# Patient Record
Sex: Female | Born: 1965 | Race: White | Hispanic: No | Marital: Married | Smoking: Never smoker
Health system: Southern US, Community
[De-identification: ages and names within clinical notes are randomized; demographics above are authoritative.]

## PROBLEM LIST (undated history)

## (undated) DIAGNOSIS — F1020 Alcohol dependence, uncomplicated: Secondary | ICD-10-CM

## (undated) DIAGNOSIS — E785 Hyperlipidemia, unspecified: Secondary | ICD-10-CM

## (undated) DIAGNOSIS — F329 Major depressive disorder, single episode, unspecified: Secondary | ICD-10-CM

## (undated) DIAGNOSIS — I10 Essential (primary) hypertension: Secondary | ICD-10-CM

## (undated) DIAGNOSIS — M069 Rheumatoid arthritis, unspecified: Secondary | ICD-10-CM

## (undated) DIAGNOSIS — F32A Depression, unspecified: Secondary | ICD-10-CM

## (undated) DIAGNOSIS — H5789 Other specified disorders of eye and adnexa: Secondary | ICD-10-CM

## (undated) DIAGNOSIS — M79604 Pain in right leg: Secondary | ICD-10-CM

## (undated) DIAGNOSIS — B009 Herpesviral infection, unspecified: Secondary | ICD-10-CM

## (undated) DIAGNOSIS — G709 Myoneural disorder, unspecified: Secondary | ICD-10-CM

## (undated) DIAGNOSIS — F99 Mental disorder, not otherwise specified: Secondary | ICD-10-CM

## (undated) DIAGNOSIS — E669 Obesity, unspecified: Secondary | ICD-10-CM

## (undated) DIAGNOSIS — K219 Gastro-esophageal reflux disease without esophagitis: Secondary | ICD-10-CM

## (undated) DIAGNOSIS — T7840XA Allergy, unspecified, initial encounter: Secondary | ICD-10-CM

## (undated) DIAGNOSIS — F419 Anxiety disorder, unspecified: Secondary | ICD-10-CM

## (undated) HISTORY — DX: Alcohol dependence, uncomplicated: F10.20

## (undated) HISTORY — DX: Herpesviral infection, unspecified: B00.9

## (undated) HISTORY — DX: Rheumatoid arthritis, unspecified: M06.9

## (undated) HISTORY — DX: Hyperlipidemia, unspecified: E78.5

## (undated) HISTORY — PX: PAROTIDECTOMY: SUR1003

## (undated) HISTORY — DX: Depression, unspecified: F32.A

## (undated) HISTORY — DX: Allergy, unspecified, initial encounter: T78.40XA

## (undated) HISTORY — PX: CHOLECYSTECTOMY: SHX55

## (undated) HISTORY — DX: Essential (primary) hypertension: I10

## (undated) HISTORY — PX: BACK SURGERY: SHX140

## (undated) HISTORY — DX: Obesity, unspecified: E66.9

## (undated) HISTORY — DX: Major depressive disorder, single episode, unspecified: F32.9

## (undated) HISTORY — PX: EYE SURGERY: SHX253

---

## 1998-02-13 ENCOUNTER — Other Ambulatory Visit: Admission: RE | Admit: 1998-02-13 | Discharge: 1998-02-13 | Payer: Self-pay | Admitting: *Deleted

## 1998-11-17 ENCOUNTER — Encounter (INDEPENDENT_AMBULATORY_CARE_PROVIDER_SITE_OTHER): Payer: Self-pay

## 1998-11-17 ENCOUNTER — Encounter: Payer: Self-pay | Admitting: Orthopedic Surgery

## 1998-11-17 ENCOUNTER — Observation Stay (HOSPITAL_COMMUNITY): Admission: RE | Admit: 1998-11-17 | Discharge: 1998-11-18 | Payer: Self-pay | Admitting: Orthopedic Surgery

## 1999-11-16 ENCOUNTER — Other Ambulatory Visit: Admission: RE | Admit: 1999-11-16 | Discharge: 1999-11-16 | Payer: Self-pay | Admitting: *Deleted

## 2000-12-21 ENCOUNTER — Other Ambulatory Visit: Admission: RE | Admit: 2000-12-21 | Discharge: 2000-12-21 | Payer: Self-pay | Admitting: Internal Medicine

## 2000-12-26 ENCOUNTER — Encounter: Admission: RE | Admit: 2000-12-26 | Discharge: 2000-12-26 | Payer: Self-pay | Admitting: Neurosurgery

## 2000-12-26 ENCOUNTER — Encounter: Payer: Self-pay | Admitting: Neurosurgery

## 2001-01-09 ENCOUNTER — Encounter: Payer: Self-pay | Admitting: Neurosurgery

## 2001-01-09 ENCOUNTER — Encounter: Admission: RE | Admit: 2001-01-09 | Discharge: 2001-01-09 | Payer: Self-pay | Admitting: Neurosurgery

## 2002-01-01 ENCOUNTER — Other Ambulatory Visit: Admission: RE | Admit: 2002-01-01 | Discharge: 2002-01-01 | Payer: Self-pay | Admitting: Internal Medicine

## 2004-01-05 ENCOUNTER — Other Ambulatory Visit: Admission: RE | Admit: 2004-01-05 | Discharge: 2004-01-05 | Payer: Self-pay | Admitting: Internal Medicine

## 2005-02-15 ENCOUNTER — Other Ambulatory Visit: Admission: RE | Admit: 2005-02-15 | Discharge: 2005-02-15 | Payer: Self-pay | Admitting: Internal Medicine

## 2006-08-25 ENCOUNTER — Encounter: Admission: RE | Admit: 2006-08-25 | Discharge: 2006-08-25 | Payer: Self-pay | Admitting: Internal Medicine

## 2007-03-02 ENCOUNTER — Emergency Department (HOSPITAL_COMMUNITY): Admission: EM | Admit: 2007-03-02 | Discharge: 2007-03-02 | Payer: Self-pay | Admitting: Emergency Medicine

## 2007-05-09 ENCOUNTER — Emergency Department (HOSPITAL_COMMUNITY): Admission: EM | Admit: 2007-05-09 | Discharge: 2007-05-09 | Payer: Self-pay | Admitting: Family Medicine

## 2007-06-11 ENCOUNTER — Ambulatory Visit (HOSPITAL_BASED_OUTPATIENT_CLINIC_OR_DEPARTMENT_OTHER): Admission: RE | Admit: 2007-06-11 | Discharge: 2007-06-11 | Payer: Self-pay | Admitting: Otolaryngology

## 2007-06-11 ENCOUNTER — Encounter (INDEPENDENT_AMBULATORY_CARE_PROVIDER_SITE_OTHER): Payer: Self-pay | Admitting: Otolaryngology

## 2007-08-07 ENCOUNTER — Emergency Department (HOSPITAL_COMMUNITY): Admission: EM | Admit: 2007-08-07 | Discharge: 2007-08-07 | Payer: Self-pay | Admitting: Family Medicine

## 2008-08-29 ENCOUNTER — Ambulatory Visit (HOSPITAL_COMMUNITY): Admission: RE | Admit: 2008-08-29 | Discharge: 2008-08-29 | Payer: Self-pay | Admitting: Internal Medicine

## 2008-10-23 ENCOUNTER — Encounter (INDEPENDENT_AMBULATORY_CARE_PROVIDER_SITE_OTHER): Payer: Self-pay | Admitting: General Surgery

## 2008-10-23 ENCOUNTER — Ambulatory Visit (HOSPITAL_COMMUNITY): Admission: RE | Admit: 2008-10-23 | Discharge: 2008-10-23 | Payer: Self-pay | Admitting: General Surgery

## 2009-03-17 ENCOUNTER — Other Ambulatory Visit: Admission: RE | Admit: 2009-03-17 | Discharge: 2009-03-17 | Payer: Self-pay | Admitting: Internal Medicine

## 2010-09-07 LAB — DIFFERENTIAL
Eosinophils Absolute: 0.1 10*3/uL (ref 0.0–0.7)
Eosinophils Relative: 2 % (ref 0–5)
Lymphs Abs: 1.4 10*3/uL (ref 0.7–4.0)
Monocytes Relative: 15 % — ABNORMAL HIGH (ref 3–12)

## 2010-09-07 LAB — COMPREHENSIVE METABOLIC PANEL
ALT: 18 U/L (ref 0–35)
AST: 20 U/L (ref 0–37)
CO2: 28 mEq/L (ref 19–32)
Calcium: 9.3 mg/dL (ref 8.4–10.5)
GFR calc Af Amer: >60 mL/min (ref >60)
GFR calc non Af Amer: >60 mL/min (ref >60)
Sodium: 138 mEq/L (ref 135–145)
Total Protein: 6.9 g/dL (ref 6.0–8.3)

## 2010-09-07 LAB — PREGNANCY, URINE: Preg Test, Ur: NEGATIVE

## 2010-09-07 LAB — CBC
MCHC: 34.2 g/dL (ref 30.0–36.0)
RBC: 4.03 MIL/uL (ref 3.87–5.11)

## 2010-10-12 NOTE — Op Note (Signed)
NAMEKATHYLEEN, RADICE                 ACCOUNT NO.:  [id number]   MEDICAL RECORD NO.:  [id number]          PATIENT TYPE:  AMB   LOCATION:  DAY                          FACILITY:  [facility]   PHYSICIAN:  Anselm Pancoast. Weatherly, M.D.DATE OF BIRTH:  February 16, 1966   DATE OF PROCEDURE:  10/23/2008  DATE OF DISCHARGE:                               OPERATIVE REPORT   PREOPERATIVE DIAGNOSES:  Chronic cholecystitis with stones.   POSTOPERATIVE DIAGNOSES:  Chronic cholecystitis with stones.   OPERATION:  Laparoscopic cholecystectomy with cholangiogram.   ANESTHESIA:  General.   HISTORY:  Lorynn Michele Cooley is a 45 year old Caucasian female, I think she is  actually an employee of [employer], who was referred to me with  symptomatic gallstones.  She has had a history of back problems and is  on pain medication of methotrexate.  Dr.  Stacey Drain is her  physician, and then she started having episodes of epigastric pain and  saw Dr. Marisue Brooklyn, who ordered an ultrasound and this showed  multiple gallstones.  I think previously they thought that her arthritis  medicine was probably given her stomach irritation.  She has had no  previous abdominal surgery.  She has a past history of rheumatoid  arthritis, but has not got the small joint deformities.  I recommended  that we proceed on with a cholecystectomy and operative cholangiogram  and she is here for the planned procedure.  She hopes to be returning  back to work next week.   DESCRIPTION OF PROCEDURE:  The patient was taken to the operative suite.  She received 3 grams of Unasyn.  She has got PAS stockings.  Permit had  been signed, etc.  After induction of general anesthesia and the abdomen  prepped, she was draped in a sterile manner and then timeout completed  with the planned procedure and etc.  A small incision was made below the  umbilicus.  She wears an umbilical ring that had been previously removed  and we made the small fascial defect.  The  fascia was picked up with two  Kochers and then the underlying peritoneum opened with a blunt tip of a  Kelly and then a pursestring suture of 0 Vicryl placed and Hassan  cannula introduced.  The gallbladder was significantly dilated but not  acutely inflamed and there is really no adhesions around the  gallbladder.  She has got numerous stones you can see in the  gallbladder.  The upper 10 mm trocar was placed after anesthetizing the  fascia under direct vision to the right side of the falciform ligament.  The two lateral 5-mm trocars were placed under direct vision after  anesthetizing the fascia.  The gallbladder was retracted upward and out  with a stone impacted in the neck of the gallbladder, and I could  retract the gallbladder laterally and then you could see what looked  like two structures kind of going to the gallbladder in the area that  was lying on top in the usual position for the artery was actually the  cystic duct.  This was encompassed with  a right angle and I put a clip  at the junction of the gallbladder.  First, I sort of placed a proximal  clip, but because of the size and I could not see any actual pulsation,  we elected to go ahead and open it and identified it as the cystic duct.  We put in a Cook catheter held in place with a clip and a cholangiogram  performed.  It shows kind of a prominent extrahepatic biliary system  with good flow into the duodenum and it tapers down nicely and I do not  think we have evidence of any common duct stones.  Preoperatively, liver  function tests were normal.  The catheter was removed and I put three  clips on the cystic duct proximally and divided it.  Next, we kind of  cautiously dissected out the structure that was a little bit larger than  I would expect for just the cystic artery and we followed it on up to  where it obviously joins the junction with the gallbladder and there was  no component going posterior as if it would  be the right hepatic branch  with the cystic duct coming off of it.  I then put two clips on it  proximally and one distally and divided it.  After I could see the  posterior side even better, I did put a third clip slightly proximal to  the others because you could see the pulsation and then really a bit  prominent artery for a cystic duct artery.  The gallbladder was then  freed from its bed.  There were no other significant vascular  structures.  A few little areas were coagulated as we were trimming the  gallbladder from its bed.  I placed the gallbladder in an EndoCatch bag  and then looking up at the gallbladder fossa on the most right side of  the falciform ligament where we retracted on the gallbladder was a  little very small peritoneal rent and this was coagulated.  It was not  actively bleeding.  The irrigation fluid was aspirated.  We re-  inspected, re-irrigated, checked the anatomy.  The clips were all in  good position.  We then switched the camera to the upper 10 mm port,  grabbed the bag and pulled it up the fascia.  I opened the fascia just  about a quarter of an inch trying to get the stones of the gallbladder  out, but the stones were so big that it was necessary to actually open  the gallbladder within the bag and then using the sponge stick crushed  the bigger stones which were really very large, but then there they were  many other smaller stones.  The bag containing the stones then could be  come on through and then I put an additional figure-of-eight of 0 Vicryl  in the fascia and anesthetized the fascia with about 10 mL of Marcaine  with adrenaline.  Good hemostasis.  Then we removed the 5-mm ports under  direct vision, checked to make sure all the remaining irrigating fluid  had been aspirated and then placed also a fascial stitch in the fascia  in the subxiphoid area since you could see right into the peritoneal  cavity.  The subcutaneous wounds were closed with  4-0 Vicryl.  Benzoin  and Steri-Strips on the skin.  The patient wants to be released later  this morning and she should be fine and her friend will pick her up.  Anselm Pancoast. Zachery Dakins, M.D.  Electronically Signed     WJW/MEDQ  D:  10/23/2008  T:  10/23/2008  Job:  [id number]   cc:   Aundra Dubin, M.D.  [address]  [place]  [place] [address]   Lovenia Kim, D.O.  Fax: [phone]

## 2010-10-12 NOTE — Op Note (Signed)
Michele Cooley, Michele Cooley                 ACCOUNT NO.:  [id number]   MEDICAL RECORD NO.:  [id number]          PATIENT TYPE:  AMB   LOCATION:  DSC                          FACILITY:  [facility]   PHYSICIAN:  Jefry H. Pollyann Kennedy, MD     DATE OF BIRTH:  [birth year]   DATE OF PROCEDURE:  06/11/2007  DATE OF DISCHARGE:                               OPERATIVE REPORT   PREOPERATIVE DIAGNOSIS:  Left parotid mass.   POSTOPERATIVE DIAGNOSIS:  Left parotid mass.   OPERATION PERFORMED:  Left superficial parotidectomy with facial nerve  dissection.   SURGEON:  Jefry H. Pollyann Kennedy, MD   ASSISTANT:  Antony Contras, MD   ANESTHESIA:  General endotracheal.   COMPLICATIONS:  None.   ESTIMATED BLOOD LOSS:  Minimal.   FINDINGS:  A 2.5 to 3 cm firm mass in the anterior aspect of the  parotid.  No other findings.  Specimen was sent for pathological  evaluation.   INDICATIONS FOR PROCEDURE:  The patient is a 45 year old lady with a  couple year history of an enlarging mass in the left parotid area.  Risks, benefits and alternatives and complications of the procedure were  explained to the patient who seemed to understand and agreed to surgery.   DESCRIPTION OF PROCEDURE:  The patient was taken to the operating room  and placed on the operating table in supine position.  Following  induction of general endotracheal anesthesia, the patient was prepped  and draped in standard fashion.  A parotidectomy incision was outlined  in the preauricular sulcus with extension down below the lobule and  around the mastoid tip.  The incision was outlined with a marking pen  and then injected with Xylocaine with epinephrine.  A 15 scalpel was  used to incise the skin and then subcutaneous tissue was dissected using  electrocautery.  The greater auricular nerve was identified and the  lateral branch was traced and preserved.  The parotid was resected off  of the upper sternocleidomastoid muscle and off the ear canal bringing  it  anteriorly.  A skin flap was elevated anteriorly beyond the tumor.  Dissection down along the ear canal exposed the posterior belly of the  digastric muscle an the tympanomastoid suture line.  The main trunk of  the facial nerve was identified and then dissection started distally  from there using a McCabe dissector, bipolar forceps and scissors.  Small branches of the nerve were identified and were dissected out  peripherally.  Once the lateral lobe of the glans was brought forward,  the mass was identified and was also dissected out from between the  upper and lower division.  There was a deep component to the mass.  The  specimen was sent for pathologic evaluation once delivered.  The wound  was irrigated with saline.  4-0 silk ties were used for larger vessels  and bipolar cautery for smaller ones.  After adequate  hemostasis was achieved, a 7 [place] round drain was left in the wound  and the wound was closed in layers using 3-0 chromic on the deep layer  and Dermabond on the skin.  The patient was then  awakened, extubated  and transferred to recovery in stable condition.      Jefry H. Pollyann Kennedy, MD  Electronically Signed     JHR/MEDQ  D:  06/11/2007  T:  06/11/2007  Job:  [id number]   cc:   Lovenia Kim, D.O.

## 2011-10-03 ENCOUNTER — Other Ambulatory Visit: Payer: Self-pay | Admitting: Neurosurgery

## 2011-10-03 DIAGNOSIS — M549 Dorsalgia, unspecified: Secondary | ICD-10-CM

## 2011-10-06 ENCOUNTER — Other Ambulatory Visit: Payer: Self-pay

## 2011-10-07 ENCOUNTER — Other Ambulatory Visit (HOSPITAL_COMMUNITY): Payer: Self-pay | Admitting: Neurosurgery

## 2011-10-07 DIAGNOSIS — M549 Dorsalgia, unspecified: Secondary | ICD-10-CM

## 2011-10-10 ENCOUNTER — Ambulatory Visit (HOSPITAL_COMMUNITY)
Admission: RE | Admit: 2011-10-10 | Discharge: 2011-10-10 | Disposition: A | Discharge status: Home / Self Care | Payer: 59 | Source: Ambulatory Visit | Attending: Neurosurgery | Admitting: Neurosurgery

## 2011-10-10 DIAGNOSIS — R209 Unspecified disturbances of skin sensation: Secondary | ICD-10-CM | POA: Insufficient documentation

## 2011-10-10 DIAGNOSIS — M79609 Pain in unspecified limb: Secondary | ICD-10-CM | POA: Insufficient documentation

## 2011-10-10 DIAGNOSIS — M549 Dorsalgia, unspecified: Secondary | ICD-10-CM

## 2011-10-14 ENCOUNTER — Other Ambulatory Visit: Payer: Self-pay | Admitting: Neurosurgery

## 2011-10-14 ENCOUNTER — Encounter (HOSPITAL_COMMUNITY): Payer: Self-pay | Admitting: Pharmacy Technician

## 2011-10-17 ENCOUNTER — Encounter (HOSPITAL_COMMUNITY): Payer: Self-pay

## 2011-10-17 ENCOUNTER — Encounter (HOSPITAL_COMMUNITY)
Admission: RE | Admit: 2011-10-17 | Discharge: 2011-10-17 | Disposition: A | Discharge status: Home / Self Care | Payer: 59 | Source: Ambulatory Visit | Attending: Neurosurgery | Admitting: Neurosurgery

## 2011-10-17 HISTORY — DX: Pain in right leg: M79.604

## 2011-10-17 HISTORY — DX: Myoneural disorder, unspecified: G70.9

## 2011-10-17 HISTORY — DX: Anxiety disorder, unspecified: F41.9

## 2011-10-17 LAB — CBC
HCT: 37.6 % (ref 36.0–46.0)
Hemoglobin: 13.1 g/dL (ref 12.0–15.0)
MCH: 32.9 pg (ref 26.0–34.0)
MCHC: 34.8 g/dL (ref 30.0–36.0)

## 2011-10-17 NOTE — Pre-Procedure Instructions (Signed)
20 ANZLEE HINESLEY  10/17/2011   Your procedure is scheduled on:  Oct 17 728 Report to [facility] at 0530 AM.  Call this number if you have problems the morning of surgery: [phone]   Remember:   Do not eat food:After Midnight.  May have clear liquids: up to 4 Hours before arrival.  Clear liquids include soda, tea, black coffee, apple or grape juice, broth.  Take these medicines the morning of surgery with A SIP OF WATER: actclovir,wellbutrin,valium,lexapro,plaquenil,arava,oxycodone,estradiol   Do not wear jewelry, make-up or nail polish.  Do not wear lotions, powders, or perfumes. You may wear deodorant.  Do not shave 48 hours prior to surgery. Men may shave face and neck.  Do not bring valuables to the hospital.  Contacts, dentures or bridgework may not be worn into surgery.  Leave suitcase in the car. After surgery it may be brought to your room.  For patients admitted to the hospital, checkout time is 11:00 AM the day of discharge.   Patients discharged the day of surgery will not be allowed to drive home.  Name and phone number of your driver: family  Special Instructions: CHG Shower Use Special Wash: 1/2 bottle night before surgery and 1/2 bottle morning of surgery.   Please read over the following fact sheets that you were given: Pain Booklet, Coughing and Deep Breathing, MRSA Information and Surgical Site Infection Prevention

## 2011-10-18 ENCOUNTER — Encounter (HOSPITAL_COMMUNITY): Payer: Self-pay | Admitting: *Deleted

## 2011-10-18 ENCOUNTER — Ambulatory Visit (HOSPITAL_COMMUNITY): Payer: 59

## 2011-10-18 ENCOUNTER — Inpatient Hospital Stay (HOSPITAL_COMMUNITY)
Admission: RE | Admit: 2011-10-18 | Discharge: 2011-10-19 | DRG: 491 | Disposition: A | Discharge status: Home / Self Care | Payer: 59 | Source: Ambulatory Visit | Attending: Neurosurgery | Admitting: Neurosurgery

## 2011-10-18 ENCOUNTER — Ambulatory Visit (HOSPITAL_COMMUNITY): Payer: 59 | Admitting: *Deleted

## 2011-10-18 ENCOUNTER — Encounter (HOSPITAL_COMMUNITY)
Admission: RE | Disposition: A | Discharge status: Home / Self Care | Payer: Self-pay | Source: Ambulatory Visit | Attending: Neurosurgery

## 2011-10-18 DIAGNOSIS — Z79899 Other long term (current) drug therapy: Secondary | ICD-10-CM

## 2011-10-18 DIAGNOSIS — M069 Rheumatoid arthritis, unspecified: Secondary | ICD-10-CM | POA: Diagnosis present

## 2011-10-18 DIAGNOSIS — M51379 Other intervertebral disc degeneration, lumbosacral region without mention of lumbar back pain or lower extremity pain: Secondary | ICD-10-CM | POA: Diagnosis present

## 2011-10-18 DIAGNOSIS — Z01812 Encounter for preprocedural laboratory examination: Secondary | ICD-10-CM

## 2011-10-18 DIAGNOSIS — F411 Generalized anxiety disorder: Secondary | ICD-10-CM | POA: Diagnosis present

## 2011-10-18 DIAGNOSIS — M5126 Other intervertebral disc displacement, lumbar region: Principal | ICD-10-CM | POA: Diagnosis present

## 2011-10-18 DIAGNOSIS — M5137 Other intervertebral disc degeneration, lumbosacral region: Secondary | ICD-10-CM | POA: Diagnosis present

## 2011-10-18 HISTORY — PX: LUMBAR LAMINECTOMY/DECOMPRESSION MICRODISCECTOMY: SHX5026

## 2011-10-18 SURGERY — LUMBAR LAMINECTOMY/DECOMPRESSION MICRODISCECTOMY
Anesthesia: General | Site: Back | Laterality: Right | Wound class: Clean

## 2011-10-18 MED ORDER — HYDROMORPHONE HCL PF 1 MG/ML IJ SOLN
0.2500 mg | INTRAMUSCULAR | Status: DC | PRN
  Administered 2011-10-18: 0.5 mg via INTRAVENOUS

## 2011-10-18 MED ORDER — NON FORMULARY
Status: DC | PRN
  Administered 2011-10-18: 3 mL

## 2011-10-18 MED ORDER — SODIUM CHLORIDE 0.9 % IV SOLN: INTRAVENOUS | Status: DC

## 2011-10-18 MED ORDER — GLYCOPYRROLATE 0.2 MG/ML IJ SOLN
INTRAMUSCULAR | Status: DC | PRN
  Administered 2011-10-18: .5 mg via INTRAVENOUS

## 2011-10-18 MED ORDER — SUFENTANIL CITRATE 50 MCG/ML IV SOLN
INTRAVENOUS | Status: DC | PRN
  Administered 2011-10-18: 10 ug via INTRAVENOUS
  Administered 2011-10-18 (×2): 20 ug via INTRAVENOUS

## 2011-10-18 MED ORDER — MENTHOL 3 MG MT LOZG
1.0000 | LOZENGE | OROMUCOSAL | Status: DC | PRN
  Filled 2011-10-18: qty 9

## 2011-10-18 MED ORDER — ONDANSETRON HCL 4 MG/2ML IJ SOLN: 4.0000 mg | INTRAMUSCULAR | Status: DC | PRN

## 2011-10-18 MED ORDER — HYDROXYCHLOROQUINE SULFATE 200 MG PO TABS
400.0000 mg | ORAL_TABLET | Freq: Every day | ORAL | Status: DC
  Administered 2011-10-18 – 2011-10-19 (×2): 400 mg via ORAL
  Filled 2011-10-18 (×2): qty 2

## 2011-10-18 MED ORDER — ONDANSETRON HCL 4 MG/2ML IJ SOLN: 4.0000 mg | Freq: Once | INTRAMUSCULAR | Status: DC | PRN

## 2011-10-18 MED ORDER — SODIUM CHLORIDE 0.9 % IJ SOLN: 3.0000 mL | INTRAMUSCULAR | Status: DC | PRN

## 2011-10-18 MED ORDER — SODIUM CHLORIDE 0.9 % IV SOLN: 250.0000 mL | INTRAVENOUS | Status: DC

## 2011-10-18 MED ORDER — BUPROPION HCL ER (XL) 300 MG PO TB24
300.0000 mg | ORAL_TABLET | Freq: Every day | ORAL | Status: DC
  Administered 2011-10-18 – 2011-10-19 (×2): 300 mg via ORAL
  Filled 2011-10-18 (×2): qty 1

## 2011-10-18 MED ORDER — SODIUM CHLORIDE 0.9 % IJ SOLN
3.0000 mL | Freq: Two times a day (BID) | INTRAMUSCULAR | Status: DC
  Administered 2011-10-18: 3 mL via INTRAVENOUS

## 2011-10-18 MED ORDER — ROCURONIUM BROMIDE 100 MG/10ML IV SOLN
INTRAVENOUS | Status: DC | PRN
  Administered 2011-10-18: 50 mg via INTRAVENOUS

## 2011-10-18 MED ORDER — MORPHINE SULFATE 10 MG/ML IJ SOLN
INTRAMUSCULAR | Status: DC | PRN
  Administered 2011-10-18 (×2): 4 mg via INTRAVENOUS
  Administered 2011-10-18: 2 mg via INTRAVENOUS

## 2011-10-18 MED ORDER — CEFAZOLIN SODIUM 1-5 GM-% IV SOLN
INTRAVENOUS | Status: AC
  Administered 2011-10-18: 1 g via INTRAVENOUS
  Filled 2011-10-18: qty 50

## 2011-10-18 MED ORDER — HEMOSTATIC AGENTS (NO CHARGE) OPTIME
TOPICAL | Status: DC | PRN
  Administered 2011-10-18: 1 via TOPICAL

## 2011-10-18 MED ORDER — OXYCODONE-ACETAMINOPHEN 5-325 MG PO TABS
1.0000 | ORAL_TABLET | ORAL | Status: DC | PRN
  Administered 2011-10-18 – 2011-10-19 (×4): 2 via ORAL
  Filled 2011-10-18 (×4): qty 2

## 2011-10-18 MED ORDER — DROPERIDOL 2.5 MG/ML IJ SOLN
INTRAMUSCULAR | Status: DC | PRN
  Administered 2011-10-18: 0.625 mg via INTRAVENOUS

## 2011-10-18 MED ORDER — MUPIROCIN 2 % EX OINT
TOPICAL_OINTMENT | CUTANEOUS | Status: AC
  Filled 2011-10-18: qty 22

## 2011-10-18 MED ORDER — ACETAMINOPHEN 650 MG RE SUPP: 650.0000 mg | RECTAL | Status: DC | PRN

## 2011-10-18 MED ORDER — OXYCODONE-ACETAMINOPHEN 5-325 MG PO TABS: 1.0000 | ORAL_TABLET | ORAL | Status: DC

## 2011-10-18 MED ORDER — ZOLPIDEM TARTRATE 5 MG PO TABS: 10.0000 mg | ORAL_TABLET | Freq: Every evening | ORAL | Status: DC | PRN

## 2011-10-18 MED ORDER — HYDROMORPHONE HCL PF 1 MG/ML IJ SOLN
INTRAMUSCULAR | Status: AC
  Filled 2011-10-18: qty 1

## 2011-10-18 MED ORDER — BUPIVACAINE-EPINEPHRINE PF 0.5-1:200000 % IJ SOLN
INTRAMUSCULAR | Status: DC | PRN
  Administered 2011-10-18: 10 mL

## 2011-10-18 MED ORDER — MORPHINE SULFATE 4 MG/ML IJ SOLN
4.0000 mg | INTRAMUSCULAR | Status: DC | PRN
  Administered 2011-10-18: 2 mg via INTRAVENOUS
  Filled 2011-10-18: qty 1

## 2011-10-18 MED ORDER — DIAZEPAM 5 MG PO TABS
5.0000 mg | ORAL_TABLET | Freq: Three times a day (TID) | ORAL | Status: DC
  Administered 2011-10-18 – 2011-10-19 (×2): 5 mg via ORAL
  Filled 2011-10-18 (×2): qty 1

## 2011-10-18 MED ORDER — MORPHINE SULFATE 4 MG/ML IJ SOLN: 0.0500 mg/kg | INTRAMUSCULAR | Status: DC | PRN

## 2011-10-18 MED ORDER — ACYCLOVIR 800 MG PO TABS
800.0000 mg | ORAL_TABLET | ORAL | Status: DC
  Administered 2011-10-18 – 2011-10-19 (×4): 800 mg via ORAL
  Filled 2011-10-18 (×8): qty 1

## 2011-10-18 MED ORDER — MORPHINE SULFATE 2 MG/ML IJ SOLN
INTRAMUSCULAR | Status: AC
  Administered 2011-10-18: 2 mg via INTRAVENOUS
  Filled 2011-10-18: qty 1

## 2011-10-18 MED ORDER — PROPOFOL 10 MG/ML IV BOLUS
INTRAVENOUS | Status: DC | PRN
  Administered 2011-10-18: 200 mg via INTRAVENOUS

## 2011-10-18 MED ORDER — ACETAMINOPHEN 325 MG PO TABS: 650.0000 mg | ORAL_TABLET | ORAL | Status: DC | PRN

## 2011-10-18 MED ORDER — NEOSTIGMINE METHYLSULFATE 1 MG/ML IJ SOLN
INTRAMUSCULAR | Status: DC | PRN
  Administered 2011-10-18: 4 mg via INTRAVENOUS

## 2011-10-18 MED ORDER — ESCITALOPRAM OXALATE 10 MG PO TABS
10.0000 mg | ORAL_TABLET | Freq: Every day | ORAL | Status: DC
  Administered 2011-10-18 – 2011-10-19 (×2): 10 mg via ORAL
  Filled 2011-10-18 (×2): qty 1

## 2011-10-18 MED ORDER — CEFAZOLIN SODIUM 1-5 GM-% IV SOLN
1.0000 g | Freq: Three times a day (TID) | INTRAVENOUS | Status: AC
  Administered 2011-10-18 (×2): 1 g via INTRAVENOUS
  Filled 2011-10-18 (×2): qty 50

## 2011-10-18 MED ORDER — ONDANSETRON HCL 4 MG/2ML IJ SOLN
INTRAMUSCULAR | Status: DC | PRN
  Administered 2011-10-18: 4 mg via INTRAVENOUS

## 2011-10-18 MED ORDER — LACTATED RINGERS IV SOLN
INTRAVENOUS | Status: DC | PRN
  Administered 2011-10-18 (×2): via INTRAVENOUS

## 2011-10-18 MED ORDER — THROMBIN 5000 UNITS EX SOLR
CUTANEOUS | Status: DC | PRN
  Administered 2011-10-18: 10000 [IU] via TOPICAL

## 2011-10-18 MED ORDER — PHENOL 1.4 % MT LIQD
1.0000 | OROMUCOSAL | Status: DC | PRN
  Filled 2011-10-18: qty 177

## 2011-10-18 MED ORDER — MIDAZOLAM HCL 5 MG/5ML IJ SOLN
INTRAMUSCULAR | Status: DC | PRN
  Administered 2011-10-18: 2 mg via INTRAVENOUS

## 2011-10-18 MED ORDER — 0.9 % SODIUM CHLORIDE (POUR BTL) OPTIME
TOPICAL | Status: DC | PRN
  Administered 2011-10-18: 1000 mL

## 2011-10-18 SURGICAL SUPPLY — 62 items
ADH SKN CLS APL DERMABOND .7 (GAUZE/BANDAGES/DRESSINGS) ×1
APL SKNCLS STERI-STRIP NONHPOA (GAUZE/BANDAGES/DRESSINGS) ×1
BENZOIN TINCTURE PRP APPL 2/3 (GAUZE/BANDAGES/DRESSINGS) ×2 IMPLANT
BLADE SURG ROTATE 9660 (MISCELLANEOUS) IMPLANT
BUR ACORN 6.0 (BURR) ×1 IMPLANT
BUR MATCHSTICK NEURO 3.0 LAGG (BURR) ×2 IMPLANT
CANISTER SUCTION 2500CC (MISCELLANEOUS) ×2 IMPLANT
CLOTH BEACON ORANGE TIMEOUT ST (SAFETY) ×2 IMPLANT
CONT SPEC 4OZ CLIKSEAL STRL BL (MISCELLANEOUS) ×2 IMPLANT
DERMABOND ADVANCED (GAUZE/BANDAGES/DRESSINGS) ×1
DERMABOND ADVANCED .7 DNX12 (GAUZE/BANDAGES/DRESSINGS) IMPLANT
DRAPE LAPAROTOMY 100X72X124 (DRAPES) ×2 IMPLANT
DRAPE MICROSCOPE LEICA (MISCELLANEOUS) ×2 IMPLANT
DRAPE POUCH INSTRU U-SHP 10X18 (DRAPES) ×2 IMPLANT
DRSG PAD ABDOMINAL 8X10 ST (GAUZE/BANDAGES/DRESSINGS) IMPLANT
DURAPREP 26ML APPLICATOR (WOUND CARE) ×2 IMPLANT
DURASEAL SPINE SEALANT 3ML (MISCELLANEOUS) ×1 IMPLANT
ELECT REM PT RETURN 9FT ADLT (ELECTROSURGICAL) ×2
ELECTRODE REM PT RTRN 9FT ADLT (ELECTROSURGICAL) ×1 IMPLANT
GAUZE SPONGE 4X4 16PLY XRAY LF (GAUZE/BANDAGES/DRESSINGS) IMPLANT
GLOVE BIOGEL M 8.0 STRL (GLOVE) ×2 IMPLANT
GLOVE BIOGEL PI IND STRL 8.5 (GLOVE) IMPLANT
GLOVE BIOGEL PI INDICATOR 8.5 (GLOVE) ×2
GLOVE EXAM NITRILE LRG STRL (GLOVE) IMPLANT
GLOVE EXAM NITRILE MD LF STRL (GLOVE) IMPLANT
GLOVE EXAM NITRILE XL STR (GLOVE) IMPLANT
GLOVE EXAM NITRILE XS STR PU (GLOVE) IMPLANT
GLOVE SURG SS PI 8.0 STRL IVOR (GLOVE) ×3 IMPLANT
GOWN BRE IMP SLV AUR LG STRL (GOWN DISPOSABLE) ×2 IMPLANT
GOWN BRE IMP SLV AUR XL STRL (GOWN DISPOSABLE) IMPLANT
GOWN STRL REIN 2XL LVL4 (GOWN DISPOSABLE) ×2 IMPLANT
KIT BASIN OR (CUSTOM PROCEDURE TRAY) ×2 IMPLANT
KIT ROOM TURNOVER OR (KITS) ×2 IMPLANT
NDL HYPO 18GX1.5 BLUNT FILL (NEEDLE) IMPLANT
NDL HYPO 21X1.5 SAFETY (NEEDLE) IMPLANT
NDL HYPO 25X1 1.5 SAFETY (NEEDLE) IMPLANT
NDL SPNL 20GX3.5 QUINCKE YW (NEEDLE) IMPLANT
NEEDLE HYPO 18GX1.5 BLUNT FILL (NEEDLE) IMPLANT
NEEDLE HYPO 21X1.5 SAFETY (NEEDLE) ×2 IMPLANT
NEEDLE HYPO 25X1 1.5 SAFETY (NEEDLE) IMPLANT
NEEDLE SPNL 20GX3.5 QUINCKE YW (NEEDLE) ×2 IMPLANT
NS IRRIG 1000ML POUR BTL (IV SOLUTION) ×2 IMPLANT
PACK LAMINECTOMY NEURO (CUSTOM PROCEDURE TRAY) ×2 IMPLANT
PAD ARMBOARD 7.5X6 YLW CONV (MISCELLANEOUS) ×6 IMPLANT
PATTIES SURGICAL .5 X1 (DISPOSABLE) ×2 IMPLANT
RUBBERBAND STERILE (MISCELLANEOUS) ×4 IMPLANT
SPONGE GAUZE 4X4 12PLY (GAUZE/BANDAGES/DRESSINGS) ×2 IMPLANT
SPONGE LAP 4X18 X RAY DECT (DISPOSABLE) IMPLANT
SPONGE SURGIFOAM ABS GEL SZ50 (HEMOSTASIS) ×2 IMPLANT
STRIP CLOSURE SKIN 1/2X4 (GAUZE/BANDAGES/DRESSINGS) ×2 IMPLANT
SUT PROLENE 6 0 BV (SUTURE) ×1 IMPLANT
SUT VIC AB 0 CT1 18XCR BRD8 (SUTURE) ×1 IMPLANT
SUT VIC AB 0 CT1 8-18 (SUTURE) ×2
SUT VIC AB 2-0 CP2 18 (SUTURE) ×2 IMPLANT
SUT VIC AB 3-0 SH 8-18 (SUTURE) ×2 IMPLANT
SYR 20CC LL (SYRINGE) ×1 IMPLANT
SYR 20ML ECCENTRIC (SYRINGE) ×2 IMPLANT
SYR 5ML LL (SYRINGE) IMPLANT
TAPE CLOTH SURG 4X10 WHT LF (GAUZE/BANDAGES/DRESSINGS) ×1 IMPLANT
TOWEL OR 17X24 6PK STRL BLUE (TOWEL DISPOSABLE) ×1 IMPLANT
TOWEL OR 17X26 10 PK STRL BLUE (TOWEL DISPOSABLE) ×3 IMPLANT
WATER STERILE IRR 1000ML POUR (IV SOLUTION) ×2 IMPLANT

## 2011-10-18 NOTE — H&P (Signed)
Michele Cooley is an 46 y.o. female.   Chief Complaint: right leg pain HPI: patient had 45 discectomy right, about 10 y/a.  Lately the pain is getting worse at to the point she is limping associated with weakness. Had fail with conservative treatment.  Past Medical History  Diagnosis Date  . Neuromuscular disorder   . Leg pain, right   . Anxiety   . Arthritis     rheumatoid arthritis    Past Surgical History  Procedure Date  . Back surgery   . Cholecystectomy     History reviewed. No pertinent family history. Social History:  reports that she has never smoked. She does not have any smokeless tobacco history on file. She reports that she does not drink alcohol or use illicit drugs.  Allergies:  Allergies  Allergen Reactions  . Codeine Itching    Medications Prior to Admission  Medication Sig Dispense Refill  . acyclovir (ZOVIRAX) 400 MG tablet Take 800 mg by mouth every 4 (four) hours while awake.      Marland Kitchen buPROPion (WELLBUTRIN XL) 300 MG 24 hr tablet Take 300 mg by mouth daily.      . diazepam (VALIUM) 5 MG tablet Take 5 mg by mouth every 8 (eight) hours.      Marland Kitchen escitalopram (LEXAPRO) 10 MG tablet Take 10 mg by mouth daily.      . hydroxychloroquine (PLAQUENIL) 200 MG tablet Take 400 mg by mouth daily.      Marland Kitchen leflunomide (ARAVA) 10 MG tablet Take 10 mg by mouth daily.      . Multiple Vitamin (MULITIVITAMIN WITH MINERALS) TABS Take 1 tablet by mouth daily.      . norethindrone-ethinyl estradiol (JUNEL FE,GILDESS FE,LOESTRIN FE) 1-20 MG-MCG tablet Take 1 tablet by mouth daily.      Marland Kitchen oxyCODONE-acetaminophen (PERCOCET) 5-325 MG per tablet Take 1 tablet by mouth every 4 (four) hours. pain        Results for orders placed during the hospital encounter of 10/17/11 (from the past 48 hour(s))  CBC     Status: Normal   Collection Time   10/17/11  4:53 PM      Component Value Range Comment   WBC 6.3  4.0 - 10.5 (K/uL)    RBC 3.98  3.87 - 5.11 (MIL/uL)    Hemoglobin 13.1  12.0 - 15.0  (g/dL)    HCT 19.1  47.8 - 29.5 (%)    MCV 94.5  78.0 - 100.0 (fL)    MCH 32.9  26.0 - 34.0 (pg)    MCHC 34.8  30.0 - 36.0 (g/dL)    RDW 62.1  30.8 - 65.7 (%)    Platelets 247  150 - 400 (K/uL)   HCG, SERUM, QUALITATIVE     Status: Normal   Collection Time   10/17/11  4:53 PM      Component Value Range Comment   Preg, Serum NEGATIVE  NEGATIVE    SURGICAL PCR SCREEN     Status: Abnormal   Collection Time   10/17/11  4:58 PM      Component Value Range Comment   MRSA, PCR NEGATIVE  NEGATIVE     Staphylococcus aureus POSITIVE (*) NEGATIVE     No results found.  Review of Systems  Constitutional: Negative.   Eyes: Negative.   Respiratory: Negative.   Cardiovascular: Negative.   Gastrointestinal: Negative.   Genitourinary: Negative.   Musculoskeletal: Positive for back pain.  Skin: Negative.   Neurological: Positive for  focal weakness.  Endo/Heme/Allergies: Negative.   Psychiatric/Behavioral: Negative.     Blood pressure 126/82, pulse 86, temperature 98.9 F (37.2 C), temperature source Oral, resp. rate 20, last menstrual period 10/05/2011, SpO2 99.00%. Physical Exam came to my office limping from the right leg hent,nl Neck - No masses or thyromegaly or limitation in range of motion,nl cv, nl. Resp, nl abdomen, nl NEURO DF weakness 3/5 in right, sle positive at 10 with sciatic notch positive. Mri, large recurrent hnp at l45  Assessment/Plan Right l45 discectomy. Aware of riska and benefits  Elizer Bostic M 10/18/2011, 7:41 AM

## 2011-10-18 NOTE — Addendum Note (Signed)
Addendum  created 10/18/11 1039 by Kerby Nora, MD   Modules edited:Orders, PRL Based Order Sets

## 2011-10-18 NOTE — Anesthesia Preprocedure Evaluation (Addendum)
Anesthesia Evaluation  Patient identified by MRN, date of birth, ID band Patient awake    Reviewed: Allergy & Precautions, H&P , NPO status , Patient's Chart, lab work & pertinent test results, reviewed documented beta blocker date and time   Airway Mallampati: I TM Distance: >3 FB Neck ROM: Full    Dental  (+) Teeth Intact and Dental Advisory Given   Pulmonary  breath sounds clear to auscultation        Cardiovascular Rhythm:Regular Rate:Normal     Neuro/Psych Anxiety    GI/Hepatic   Endo/Other    Renal/GU      Musculoskeletal   Abdominal   Peds  Hematology   Anesthesia Other Findings   Reproductive/Obstetrics                          Anesthesia Physical Anesthesia Plan  ASA: II  Anesthesia Plan: General   Post-op Pain Management:    Induction: Intravenous  Airway Management Planned: Oral ETT  Additional Equipment:   Intra-op Plan:   Post-operative Plan: Extubation in OR  Informed Consent: I have reviewed the patients History and Physical, chart, labs and discussed the procedure including the risks, benefits and alternatives for the proposed anesthesia with the patient or authorized representative who has indicated his/her understanding and acceptance.   Dental advisory given  Plan Discussed with: CRNA, Anesthesiologist and Surgeon  Anesthesia Plan Comments:         Anesthesia Quick Evaluation

## 2011-10-18 NOTE — Anesthesia Postprocedure Evaluation (Signed)
  Anesthesia Post-op Note  Patient: Michele Cooley  Procedure(s) Performed: Procedure(s) (LRB): LUMBAR LAMINECTOMY/DECOMPRESSION MICRODISCECTOMY (Right)  Patient Location: PACU  Anesthesia Type: General  Level of Consciousness: awake, alert  and oriented  Airway and Oxygen Therapy: Patient Spontanous Breathing and Patient connected to nasal cannula oxygen  Post-op Pain: mild  Post-op Assessment: Post-op Vital signs reviewed  Post-op Vital Signs: Reviewed  Complications: No apparent anesthesia complications

## 2011-10-18 NOTE — Preoperative (Signed)
Beta Blockers   Reason not to administer Beta Blockers:Not Applicable 

## 2011-10-18 NOTE — Progress Notes (Signed)
Initial review is complete. 

## 2011-10-18 NOTE — Progress Notes (Deleted)
Pt given D/C instructions with understanding. Pt D/C'd home via wheelchair @ 1630 per MD order. Pt stable @ D/C. Rema Fendt, RN

## 2011-10-18 NOTE — Transfer of Care (Signed)
Immediate Anesthesia Transfer of Care Note  Patient: Michele Cooley  Procedure(s) Performed: Procedure(s) (LRB): LUMBAR LAMINECTOMY/DECOMPRESSION MICRODISCECTOMY (Right)  Patient Location: PACU  Anesthesia Type: General  Level of Consciousness: awake  Airway & Oxygen Therapy: Patient Spontanous Breathing and Patient connected to face mask oxygen  Post-op Assessment: Report given to PACU RN and Post -op Vital signs reviewed and stable  Post vital signs: Reviewed and stable  Complications: No apparent anesthesia complications

## 2011-10-18 NOTE — Progress Notes (Signed)
Right 45 lumbar discsctomy done  [phone]

## 2011-10-19 ENCOUNTER — Encounter (HOSPITAL_COMMUNITY): Payer: Self-pay | Admitting: Neurosurgery

## 2011-10-19 NOTE — Op Note (Signed)
[name], WITTS NO.:  [id number]  MEDICAL RECORD NO.:  [id number]  LOCATION:  3533                         FACILITY:  [facility]  PHYSICIAN:  Hilda Lias, M.D.   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  PROCEDURE:  Right L4-5 diskectomy, decompression of the L5 nerve root, and lysis of adhesion, microscope.  SURGEON:  Hilda Lias, MD.  ASSISTANT:  Kathaleen Maser. Pool, MD  CLINICAL HISTORY:  The patient is a 46 year old female who 10 years ago underwent surgery at the level 4-5 at right side. Lately, she had been complaining of back pain, worsened to the right leg and weakness  __________.  X-ray shows degenerative disk disease, _and a herniated disc right at_________ 4-5.  The patient wanted to proceed with surgery because she was getting worse__________.  The surgery was fully explained to her including possibility of recurrence, no improvement  __________ CSF leak, infection.  PROCEDURE IN DETAIL:  The patient was taken to the OR; and after intubation, she was positioned in prone manner. The back was cleaned with DuraPrep.  Drapes were applied.  Midline incision following the previous one was made straight down to the lumbar area. Lateral retraction was done_._________  x-ray showed  We were at l45_. The yellow_________ ligament _was removed_________. We found area to be adherent to the canal laterally __________ probably from the surgery 10 years ago.  Retraction was made and at the end, we were able to move the thecal sac laterally. The patient had quite a bit of tissue, mostly compromising via L5 nerve root.  __________ .  Once this was mobilized and the adhesion was removed, we  found a fragment __________ right at the level of the axilla of the nerve. then__________  we entered the disk space and medial and lateral diskectomy was _done_________.  At the end, we had good decompression. Valsalva maneuver was negative. __________. The area was irrigated and closed with several laters of__________ Vicryl and nylon.          ______________________________ Hilda Lias, M.D.     EB/MEDQ  D:  10/18/2011  T:  10/18/2011  Job:  [id number]

## 2011-10-19 NOTE — Discharge Instructions (Signed)
Wound Care °Leave incision open to air. °You may shower. °Do not scrub directly on incision.  °Leave steri-strips on incision.  They will fall off by themselves. °Do not put any creams, lotions, or ointments on incision. °Activity °Walk each and every day, increasing distance each day. °No lifting greater than 5 lbs.  Avoid bending, arching, and twisting. °No driving for 2 weeks; may ride as a passenger locally. ° °Diet °Resume your normal diet.  °Return to Work °Will be discussed at you follow up appointment. °Call Your Doctor If Any of These Occur °Redness, drainage, or swelling at the wound.  °Temperature greater than 101 degrees. °Severe pain not relieved by pain medication. °Incision starts to come apart. °Follow Up Appt °Call today for appointment in 3-4 weeks ([phone]) or for problems.  If you have any hardware placed in your spine, you will need an x-ray before your appointment. ° °

## 2011-10-19 NOTE — Discharge Summary (Signed)
Physician Discharge Summary  Patient ID: Michele Cooley MRN: [id number] DOB/AGE: [birth year] 46 y.o.  Admit date: 10/18/2011 Discharge date: 10/19/2011  Admission Diagnoses: right l45 hnp  Discharge Diagnoses: same   Discharged Condition: stable  Hospital Course: surgery 10/18/11  Consults:none  Significant Diagnostic Studies: mri  Treatments: surgery  Discharge Exam: Blood pressure 121/81, pulse 100, temperature 98.7 F (37.1 C), temperature source Oral, resp. rate 18, last menstrual period 10/05/2011, SpO2 97.00%. Except for incisional pain, her pe is normal-  Disposition: home on percocet and diazepam,   Medication List  As of 10/19/2011 11:13 AM   ASK your doctor about these medications         acyclovir 400 MG tablet   Commonly known as: ZOVIRAX   Take 800 mg by mouth every 4 (four) hours while awake.      buPROPion 300 MG 24 hr tablet   Commonly known as: WELLBUTRIN XL   Take 300 mg by mouth daily.      diazepam 5 MG tablet   Commonly known as: VALIUM   Take 5 mg by mouth every 8 (eight) hours.      escitalopram 10 MG tablet   Commonly known as: LEXAPRO   Take 10 mg by mouth daily.      hydroxychloroquine 200 MG tablet   Commonly known as: PLAQUENIL   Take 400 mg by mouth daily.      leflunomide 10 MG tablet   Commonly known as: ARAVA   Take 10 mg by mouth daily.      mulitivitamin with minerals Tabs   Take 1 tablet by mouth daily.      norethindrone-ethinyl estradiol 1-20 MG-MCG tablet   Commonly known as: JUNEL FE,GILDESS FE,LOESTRIN FE   Take 1 tablet by mouth daily.      oxyCODONE-acetaminophen 5-325 MG per tablet   Commonly known as: PERCOCET   Take 1 tablet by mouth every 4 (four) hours. pain             Signed: Karn Cassis 10/19/2011, 11:13 AM

## 2011-10-19 NOTE — Clinical Social Work Note (Signed)
CSW received a consult for SNF. PT is not recommending PT follow up. CSW is signing off as no further needs identified. Please reconsult if a need arises prior to discharge.   Dede Query, MSW, LCSWA [phone] Covering CSW) [phone]

## 2011-10-19 NOTE — Evaluation (Signed)
Physical Therapy Evaluation Patient Details Name: Michele Cooley MRN: [id number] DOB: 1965-06-22 Today's Date: 10/19/2011 Time: 1478-2956 PT Time Calculation (min): 26 min  PT Assessment / Plan / Recommendation Clinical Impression  Pt is 46 y/o female admitted for s/p Right L4-5 lumbar discsctomy.  Pt moving well and mod (I) with all mobility.  No further PT needs and PT will sign off.      PT Assessment  Patent does not need any further PT services    Follow Up Recommendations  No PT follow up    Barriers to Discharge        lEquipment Recommendations  None recommended by PT    Recommendations for Other Services     Frequency      Precautions / Restrictions Precautions Precautions: Back Precaution Booklet Issued: Yes (comment) Precaution Comments: Educated pt on 3/3 back precautions   Pertinent Vitals/Pain C/o incision pain but does not rate      Mobility  Bed Mobility Bed Mobility: Rolling Right;Right Sidelying to Sit Rolling Right: 6: Modified independent (Device/Increase time) Right Sidelying to Sit: 6: Modified independent (Device/Increase time) Transfers Transfers: Sit to Stand;Stand to Sit Sit to Stand: 6: Modified independent (Device/Increase time);From bed;From chair/3-in-1 Stand to Sit: 6: Modified independent (Device/Increase time);To bed;To chair/3-in-1 Ambulation/Gait Ambulation/Gait Assistance: 6: Modified independent (Device/Increase time) Ambulation Distance (Feet): 200 Feet Assistive device: None Ambulation/Gait Assistance Details: Needs extra time to complete ambulation due to surgical site Gait Pattern: Decreased trunk rotation;Wide base of support Stairs: Yes Stairs Assistance: 6: Modified independent (Device/Increase time) Stair Management Technique: One rail Left;Alternating pattern;Forwards Number of Stairs: 3     Exercises     PT Diagnosis:    PT Problem List:   PT Treatment Interventions:     PT Goals    Visit Information  Last PT  Received On: 10/19/11 Assistance Needed: +1    Subjective Data  Subjective: "I'm feeling really good and ready to go home. Patient Stated Goal: To go home   Prior Functioning  Home Living Lives With: Spouse Available Help at Discharge: Family Type of Home: House Home Access: Stairs to enter [employer] of Steps: 3 Entrance Stairs-Rails: None Home Layout: Two level;Able to live on main level with bedroom/bathroom;Full bath on main level Bathroom Shower/Tub: Walk-in shower;Door [employer] Toilet: Standard Bathroom Accessibility: Yes How Accessible: Accessible via walker Home Adaptive Equipment: Grab bars around toilet;Grab bars in shower;Built-in shower seat;Hand-held shower hose;Reacher Prior Function Level of Independence: Independent Able to Take Stairs?: Yes Driving: Yes Vocation: Full time employment Communication Communication: No difficulties    Cognition  Overall Cognitive Status: Appears within functional limits for tasks assessed/performed Arousal/Alertness: Awake/alert Orientation Level: Appears intact for tasks assessed Behavior During Session: [facility] for tasks performed    Extremity/Trunk Assessment Right Upper Extremity Assessment RUE ROM/Strength/Tone: Within functional levels RUE Sensation: WFL - Light Touch RUE Coordination: WFL - gross/fine motor Left Upper Extremity Assessment LUE ROM/Strength/Tone: Within functional levels LUE Sensation: WFL - Light Touch LUE Coordination: WFL - gross/fine motor Right Lower Extremity Assessment RLE ROM/Strength/Tone: Within functional levels RLE Sensation: WFL - Light Touch RLE Coordination: WFL - gross/fine motor Left Lower Extremity Assessment LLE ROM/Strength/Tone: Within functional levels LLE Sensation: WFL - Light Touch LLE Coordination: WFL - gross/fine motor Trunk Assessment Trunk Assessment: Normal   Balance    End of Session PT - End of Session Equipment Utilized During Treatment: Gait  belt Activity Tolerance: Patient tolerated treatment well Patient left: in chair;with call bell/phone within reach Nurse Communication:  Mobility status   Tarea Skillman 10/19/2011, 8:28 AM Jake Shark, PT DPT [phone]

## 2012-03-23 ENCOUNTER — Other Ambulatory Visit (HOSPITAL_COMMUNITY): Payer: Self-pay | Admitting: Orthopedic Surgery

## 2012-03-23 ENCOUNTER — Ambulatory Visit (HOSPITAL_COMMUNITY)
Admission: RE | Admit: 2012-03-23 | Discharge: 2012-03-23 | Disposition: A | Discharge status: Home / Self Care | Payer: 59 | Source: Ambulatory Visit | Attending: Orthopedic Surgery | Admitting: Orthopedic Surgery

## 2012-03-23 DIAGNOSIS — M545 Low back pain, unspecified: Secondary | ICD-10-CM

## 2012-03-23 DIAGNOSIS — M5126 Other intervertebral disc displacement, lumbar region: Secondary | ICD-10-CM | POA: Insufficient documentation

## 2012-03-23 DIAGNOSIS — Z9889 Other specified postprocedural states: Secondary | ICD-10-CM | POA: Insufficient documentation

## 2012-03-23 DIAGNOSIS — M5380 Other specified dorsopathies, site unspecified: Secondary | ICD-10-CM | POA: Insufficient documentation

## 2012-03-23 MED ORDER — GADOBENATE DIMEGLUMINE 529 MG/ML IV SOLN
16.0000 mL | Freq: Once | INTRAVENOUS | Status: AC | PRN
  Administered 2012-03-23: 16 mL via INTRAVENOUS

## 2012-03-26 ENCOUNTER — Ambulatory Visit (HOSPITAL_COMMUNITY): Admission: RE | Admit: 2012-03-26 | Payer: 59 | Source: Ambulatory Visit

## 2012-04-10 ENCOUNTER — Other Ambulatory Visit: Payer: Self-pay | Admitting: Neurosurgery

## 2012-04-18 ENCOUNTER — Encounter (HOSPITAL_COMMUNITY): Payer: Self-pay | Admitting: Pharmacy Technician

## 2012-04-23 ENCOUNTER — Encounter (HOSPITAL_COMMUNITY): Payer: Self-pay

## 2012-04-23 ENCOUNTER — Encounter (HOSPITAL_COMMUNITY)
Admission: RE | Admit: 2012-04-23 | Discharge: 2012-04-23 | Disposition: A | Discharge status: Home / Self Care | Payer: 59 | Source: Ambulatory Visit | Attending: Neurosurgery | Admitting: Neurosurgery

## 2012-04-23 HISTORY — DX: Other specified disorders of eye and adnexa: H57.89

## 2012-04-23 HISTORY — DX: Mental disorder, not otherwise specified: F99

## 2012-04-23 HISTORY — DX: Gastro-esophageal reflux disease without esophagitis: K21.9

## 2012-04-23 LAB — CBC
HCT: 37.4 % (ref 36.0–46.0)
MCH: 32.9 pg (ref 26.0–34.0)
MCHC: 34.5 g/dL (ref 30.0–36.0)
MCV: 95.4 fL (ref 78.0–100.0)
RDW: 13.9 % (ref 11.5–15.5)

## 2012-04-23 LAB — BASIC METABOLIC PANEL
BUN: 12 mg/dL (ref 6–23)
CO2: 28 mEq/L (ref 19–32)
Chloride: 98 mEq/L (ref 96–112)
Creatinine, Ser: 0.67 mg/dL (ref 0.50–1.10)
GFR calc Af Amer: >90 mL/min (ref >90)
Glucose, Bld: 87 mg/dL (ref 70–99)

## 2012-04-23 LAB — HCG, SERUM, QUALITATIVE: Preg, Serum: NEGATIVE

## 2012-04-23 LAB — SURGICAL PCR SCREEN: Staphylococcus aureus: POSITIVE — AB

## 2012-04-23 LAB — ABO/RH: ABO/RH(D): O POS

## 2012-04-23 NOTE — Pre-Procedure Instructions (Signed)
20 Michele Cooley  04/23/2012   Your procedure is scheduled on:   Wednesday  05/02/12  Report to [facility] at  1115 AM.  Call this number if you have problems the morning of surgery: [phone]   Remember:   Do not eat food OR DRINK :After Midnight    Take these medicines the morning of surgery with A SIP OF WATER:  ACYCLOVIR, XANAX, WELLBUTRIN, LEXAPRO, DILAUDID AS NEEDED, PRILOSEC, ORTHOCYCLEN    Do not wear jewelry, make-up or nail polish.  Do not wear lotions, powders, or perfumes. You may wear deodorant.  Do not shave 48 hours prior to surgery. Men may shave face and neck.  Do not bring valuables to the hospital.  Contacts, dentures or bridgework may not be worn into surgery.  Leave suitcase in the car. After surgery it may be brought to your room.  For patients admitted to the hospital, checkout time is 11:00 AM the day of discharge.   Patients discharged the day of surgery will not be allowed to drive home.  Name and phone number of your driver:   Special Instructions: Shower using CHG 2 nights before surgery and the night before surgery.  If you shower the day of surgery use CHG.  Use special wash - you have one bottle of CHG for all showers.  You should use approximately 1/3 of the bottle for each shower.   Please read over the following fact sheets that you were given: Pain Booklet, Coughing and Deep Breathing, Blood Transfusion Information, MRSA Information and Surgical Site Infection Prevention

## 2012-04-23 NOTE — Progress Notes (Addendum)
Call to [facility] for orders from Dr. Jeral Fruit. They will be signed 04/30/2012, when he is back in the office

## 2012-04-23 NOTE — Progress Notes (Signed)
Pt. Denies ever having any cardiac testing in the past, denies cardiac history.

## 2012-05-02 ENCOUNTER — Ambulatory Visit (HOSPITAL_COMMUNITY): Payer: 59

## 2012-05-02 ENCOUNTER — Encounter (HOSPITAL_COMMUNITY)
Admission: RE | Disposition: A | Discharge status: Home / Self Care | Payer: Self-pay | Source: Ambulatory Visit | Attending: Neurosurgery

## 2012-05-02 ENCOUNTER — Inpatient Hospital Stay (HOSPITAL_COMMUNITY)
Admission: RE | Admit: 2012-05-02 | Discharge: 2012-05-05 | DRG: 460 | Disposition: A | Discharge status: Home / Self Care | Payer: 59 | Source: Ambulatory Visit | Attending: Neurosurgery | Admitting: Neurosurgery

## 2012-05-02 ENCOUNTER — Ambulatory Visit (HOSPITAL_COMMUNITY): Payer: 59 | Admitting: Anesthesiology

## 2012-05-02 ENCOUNTER — Encounter (HOSPITAL_COMMUNITY): Payer: Self-pay | Admitting: Anesthesiology

## 2012-05-02 ENCOUNTER — Encounter (HOSPITAL_COMMUNITY): Payer: Self-pay | Admitting: General Practice

## 2012-05-02 ENCOUNTER — Encounter (HOSPITAL_COMMUNITY): Payer: Self-pay | Admitting: *Deleted

## 2012-05-02 DIAGNOSIS — Z9089 Acquired absence of other organs: Secondary | ICD-10-CM

## 2012-05-02 DIAGNOSIS — K219 Gastro-esophageal reflux disease without esophagitis: Secondary | ICD-10-CM | POA: Diagnosis present

## 2012-05-02 DIAGNOSIS — M51379 Other intervertebral disc degeneration, lumbosacral region without mention of lumbar back pain or lower extremity pain: Secondary | ICD-10-CM | POA: Diagnosis present

## 2012-05-02 DIAGNOSIS — M5137 Other intervertebral disc degeneration, lumbosacral region: Secondary | ICD-10-CM | POA: Diagnosis present

## 2012-05-02 DIAGNOSIS — M5126 Other intervertebral disc displacement, lumbar region: Principal | ICD-10-CM | POA: Diagnosis present

## 2012-05-02 DIAGNOSIS — IMO0002 Reserved for concepts with insufficient information to code with codable children: Secondary | ICD-10-CM | POA: Diagnosis present

## 2012-05-02 DIAGNOSIS — G709 Myoneural disorder, unspecified: Secondary | ICD-10-CM | POA: Diagnosis present

## 2012-05-02 SURGERY — POSTERIOR LUMBAR FUSION 1 LEVEL
Anesthesia: General | Site: Back | Wound class: Clean

## 2012-05-02 MED ORDER — MUPIROCIN CALCIUM 2 % EX CREA
TOPICAL_CREAM | Freq: Two times a day (BID) | CUTANEOUS | Status: DC
  Administered 2012-05-02: 22:00:00 via TOPICAL
  Administered 2012-05-02: 1 via TOPICAL
  Administered 2012-05-03 – 2012-05-05 (×5): via TOPICAL
  Filled 2012-05-02: qty 15

## 2012-05-02 MED ORDER — SODIUM CHLORIDE 0.9 % IJ SOLN: 9.0000 mL | INTRAMUSCULAR | Status: DC | PRN

## 2012-05-02 MED ORDER — PROPOFOL 10 MG/ML IV BOLUS
INTRAVENOUS | Status: DC | PRN
  Administered 2012-05-02: 160 mg via INTRAVENOUS

## 2012-05-02 MED ORDER — SODIUM CHLORIDE 0.9 % IV SOLN: 250.0000 mL | INTRAVENOUS | Status: DC

## 2012-05-02 MED ORDER — NALOXONE HCL 0.4 MG/ML IJ SOLN: 0.4000 mg | INTRAMUSCULAR | Status: DC | PRN

## 2012-05-02 MED ORDER — ONDANSETRON HCL 4 MG/2ML IJ SOLN: 4.0000 mg | INTRAMUSCULAR | Status: DC | PRN

## 2012-05-02 MED ORDER — SODIUM CHLORIDE 0.9 % IJ SOLN: 3.0000 mL | INTRAMUSCULAR | Status: DC | PRN

## 2012-05-02 MED ORDER — LACTATED RINGERS IV SOLN
INTRAVENOUS | Status: DC | PRN
  Administered 2012-05-02 (×3): via INTRAVENOUS

## 2012-05-02 MED ORDER — CEFAZOLIN SODIUM-DEXTROSE 2-3 GM-% IV SOLR
INTRAVENOUS | Status: AC
  Administered 2012-05-02: 2 g via INTRAVENOUS
  Filled 2012-05-02: qty 50

## 2012-05-02 MED ORDER — ACETAMINOPHEN 650 MG RE SUPP: 650.0000 mg | RECTAL | Status: DC | PRN

## 2012-05-02 MED ORDER — FENTANYL CITRATE 0.05 MG/ML IJ SOLN: 25.0000 ug | INTRAMUSCULAR | Status: DC | PRN

## 2012-05-02 MED ORDER — 0.9 % SODIUM CHLORIDE (POUR BTL) OPTIME
TOPICAL | Status: DC | PRN
  Administered 2012-05-02: 1000 mL

## 2012-05-02 MED ORDER — ZOLPIDEM TARTRATE 5 MG PO TABS: 5.0000 mg | ORAL_TABLET | Freq: Every evening | ORAL | Status: DC | PRN

## 2012-05-02 MED ORDER — FENTANYL CITRATE 0.05 MG/ML IJ SOLN
INTRAMUSCULAR | Status: AC
  Filled 2012-05-02: qty 2

## 2012-05-02 MED ORDER — FENTANYL CITRATE 0.05 MG/ML IJ SOLN
INTRAMUSCULAR | Status: DC | PRN
  Administered 2012-05-02 (×4): 50 ug via INTRAVENOUS
  Administered 2012-05-02: 100 ug via INTRAVENOUS
  Administered 2012-05-02: 50 ug via INTRAVENOUS

## 2012-05-02 MED ORDER — MIDAZOLAM HCL 5 MG/5ML IJ SOLN
INTRAMUSCULAR | Status: DC | PRN
  Administered 2012-05-02: 2 mg via INTRAVENOUS

## 2012-05-02 MED ORDER — MUPIROCIN 2 % EX OINT
TOPICAL_OINTMENT | CUTANEOUS | Status: AC
  Administered 2012-05-02: 21:00:00
  Filled 2012-05-02: qty 22

## 2012-05-02 MED ORDER — DIAZEPAM 5 MG PO TABS
5.0000 mg | ORAL_TABLET | Freq: Four times a day (QID) | ORAL | Status: DC | PRN
  Administered 2012-05-04 (×2): 5 mg via ORAL
  Filled 2012-05-02 (×3): qty 1

## 2012-05-02 MED ORDER — DIPHENHYDRAMINE HCL 12.5 MG/5ML PO ELIX
12.5000 mg | ORAL_SOLUTION | Freq: Four times a day (QID) | ORAL | Status: DC | PRN
  Administered 2012-05-03: 12.5 mg via ORAL
  Filled 2012-05-02: qty 10

## 2012-05-02 MED ORDER — THROMBIN 20000 UNITS EX SOLR
CUTANEOUS | Status: DC | PRN
  Administered 2012-05-02: 17:00:00 via TOPICAL

## 2012-05-02 MED ORDER — ROCURONIUM BROMIDE 100 MG/10ML IV SOLN
INTRAVENOUS | Status: DC | PRN
  Administered 2012-05-02: 50 mg via INTRAVENOUS

## 2012-05-02 MED ORDER — FENTANYL CITRATE 0.05 MG/ML IJ SOLN
25.0000 ug | INTRAMUSCULAR | Status: DC | PRN
  Administered 2012-05-02 (×2): 50 ug via INTRAVENOUS

## 2012-05-02 MED ORDER — LIDOCAINE HCL (CARDIAC) 20 MG/ML IV SOLN
INTRAVENOUS | Status: DC | PRN
  Administered 2012-05-02: 80 mg via INTRAVENOUS

## 2012-05-02 MED ORDER — GLYCOPYRROLATE 0.2 MG/ML IJ SOLN
INTRAMUSCULAR | Status: DC | PRN
  Administered 2012-05-02: .4 mg via INTRAVENOUS

## 2012-05-02 MED ORDER — DIPHENHYDRAMINE HCL 50 MG/ML IJ SOLN: 12.5000 mg | Freq: Four times a day (QID) | INTRAMUSCULAR | Status: DC | PRN

## 2012-05-02 MED ORDER — ACETAMINOPHEN 325 MG PO TABS: 650.0000 mg | ORAL_TABLET | ORAL | Status: DC | PRN

## 2012-05-02 MED ORDER — NEOSTIGMINE METHYLSULFATE 1 MG/ML IJ SOLN
INTRAMUSCULAR | Status: DC | PRN
  Administered 2012-05-02: 2.5 mg via INTRAVENOUS

## 2012-05-02 MED ORDER — ONDANSETRON HCL 4 MG/2ML IJ SOLN: 4.0000 mg | Freq: Four times a day (QID) | INTRAMUSCULAR | Status: DC | PRN

## 2012-05-02 MED ORDER — ONDANSETRON HCL 4 MG/2ML IJ SOLN
INTRAMUSCULAR | Status: DC | PRN
  Administered 2012-05-02: 4 mg via INTRAVENOUS

## 2012-05-02 MED ORDER — ZOLPIDEM TARTRATE 5 MG PO TABS: 10.0000 mg | ORAL_TABLET | Freq: Every evening | ORAL | Status: DC | PRN

## 2012-05-02 MED ORDER — MORPHINE SULFATE (PF) 1 MG/ML IV SOLN
INTRAVENOUS | Status: DC
  Administered 2012-05-02 – 2012-05-03 (×2): via INTRAVENOUS
  Administered 2012-05-03: 18 mg via INTRAVENOUS
  Administered 2012-05-03: 11:00:00 via INTRAVENOUS
  Administered 2012-05-03: 22.36 mg via INTRAVENOUS
  Administered 2012-05-03: 16.39 mg via INTRAVENOUS
  Filled 2012-05-02 (×3): qty 25

## 2012-05-02 MED ORDER — CEFAZOLIN SODIUM 1-5 GM-% IV SOLN
1.0000 g | Freq: Three times a day (TID) | INTRAVENOUS | Status: AC
  Administered 2012-05-02 – 2012-05-03 (×2): 1 g via INTRAVENOUS
  Filled 2012-05-02 (×3): qty 50

## 2012-05-02 MED ORDER — OXYCODONE-ACETAMINOPHEN 5-325 MG PO TABS
1.0000 | ORAL_TABLET | ORAL | Status: DC | PRN
  Administered 2012-05-02 – 2012-05-03 (×2): 2 via ORAL
  Filled 2012-05-02 (×4): qty 2

## 2012-05-02 MED ORDER — SODIUM CHLORIDE 0.9 % IV SOLN
INTRAVENOUS | Status: DC
  Administered 2012-05-02: 21:00:00 via INTRAVENOUS

## 2012-05-02 MED ORDER — PHENOL 1.4 % MT LIQD: 1.0000 | OROMUCOSAL | Status: DC | PRN

## 2012-05-02 MED ORDER — MENTHOL 3 MG MT LOZG: 1.0000 | LOZENGE | OROMUCOSAL | Status: DC | PRN

## 2012-05-02 MED ORDER — SODIUM CHLORIDE 0.9 % IJ SOLN
3.0000 mL | Freq: Two times a day (BID) | INTRAMUSCULAR | Status: DC
  Administered 2012-05-02 – 2012-05-05 (×5): 3 mL via INTRAVENOUS

## 2012-05-02 MED ORDER — DEXAMETHASONE SODIUM PHOSPHATE 4 MG/ML IJ SOLN
INTRAMUSCULAR | Status: DC | PRN
  Administered 2012-05-02: 8 mg via INTRAVENOUS

## 2012-05-02 SURGICAL SUPPLY — 76 items
ADH SKN CLS LQ APL DERMABOND (GAUZE/BANDAGES/DRESSINGS) ×1
APL SKNCLS STERI-STRIP NONHPOA (GAUZE/BANDAGES/DRESSINGS) ×1
BENZOIN TINCTURE PRP APPL 2/3 (GAUZE/BANDAGES/DRESSINGS) ×2 IMPLANT
BLADE SURG ROTATE 9660 (MISCELLANEOUS) IMPLANT
BONE EQUIVA 5CC (Bone Implant) ×1 IMPLANT
BUR ACORN 6.0 (BURR) ×2 IMPLANT
BUR MATCHSTICK NEURO 3.0 LAGG (BURR) ×2 IMPLANT
CANISTER SUCTION 2500CC (MISCELLANEOUS) ×2 IMPLANT
CAP REVERE LOCKING (Cap) ×4 IMPLANT
CLOTH BEACON ORANGE TIMEOUT ST (SAFETY) ×2 IMPLANT
CONT SPEC 4OZ CLIKSEAL STRL BL (MISCELLANEOUS) ×2 IMPLANT
COVER BACK TABLE 24X17X13 BIG (DRAPES) IMPLANT
COVER TABLE BACK 60X90 (DRAPES) ×2 IMPLANT
DERMABOND ADHESIVE PROPEN (GAUZE/BANDAGES/DRESSINGS) ×1
DERMABOND ADVANCED .7 DNX6 (GAUZE/BANDAGES/DRESSINGS) IMPLANT
DRAPE C-ARM 42X72 X-RAY (DRAPES) ×4 IMPLANT
DRAPE LAPAROTOMY 100X72X124 (DRAPES) ×2 IMPLANT
DRAPE POUCH INSTRU U-SHP 10X18 (DRAPES) ×2 IMPLANT
DRSG PAD ABDOMINAL 8X10 ST (GAUZE/BANDAGES/DRESSINGS) ×2 IMPLANT
DURAPREP 26ML APPLICATOR (WOUND CARE) ×2 IMPLANT
ELECT BLADE 4.0 EZ CLEAN MEGAD (MISCELLANEOUS) ×2
ELECT REM PT RETURN 9FT ADLT (ELECTROSURGICAL) ×2
ELECTRODE BLDE 4.0 EZ CLN MEGD (MISCELLANEOUS) IMPLANT
ELECTRODE REM PT RTRN 9FT ADLT (ELECTROSURGICAL) ×1 IMPLANT
EVACUATOR 1/8 PVC DRAIN (DRAIN) IMPLANT
GAUZE SPONGE 4X4 16PLY XRAY LF (GAUZE/BANDAGES/DRESSINGS) ×2 IMPLANT
GLOVE BIO SURGEON STRL SZ8 (GLOVE) ×1 IMPLANT
GLOVE BIOGEL M 8.0 STRL (GLOVE) ×2 IMPLANT
GLOVE BIOGEL PI IND STRL 7.5 (GLOVE) IMPLANT
GLOVE BIOGEL PI IND STRL 8 (GLOVE) IMPLANT
GLOVE BIOGEL PI INDICATOR 7.5 (GLOVE) ×1
GLOVE BIOGEL PI INDICATOR 8 (GLOVE) ×3
GLOVE ECLIPSE 7.5 STRL STRAW (GLOVE) ×6 IMPLANT
GLOVE EXAM NITRILE LRG STRL (GLOVE) ×1 IMPLANT
GLOVE EXAM NITRILE MD LF STRL (GLOVE) IMPLANT
GLOVE EXAM NITRILE XL STR (GLOVE) IMPLANT
GLOVE EXAM NITRILE XS STR PU (GLOVE) IMPLANT
GLOVE INDICATOR 8.5 STRL (GLOVE) ×1 IMPLANT
GOWN BRE IMP SLV AUR LG STRL (GOWN DISPOSABLE) ×2 IMPLANT
GOWN BRE IMP SLV AUR XL STRL (GOWN DISPOSABLE) ×2 IMPLANT
GOWN STRL REIN 2XL LVL4 (GOWN DISPOSABLE) ×2 IMPLANT
KIT BASIN OR (CUSTOM PROCEDURE TRAY) ×2 IMPLANT
KIT ROOM TURNOVER OR (KITS) ×2 IMPLANT
MILL MEDIUM DISP (BLADE) ×1 IMPLANT
NDL HYPO 18GX1.5 BLUNT FILL (NEEDLE) IMPLANT
NDL HYPO 21X1.5 SAFETY (NEEDLE) IMPLANT
NDL HYPO 25X1 1.5 SAFETY (NEEDLE) IMPLANT
NEEDLE HYPO 18GX1.5 BLUNT FILL (NEEDLE) IMPLANT
NEEDLE HYPO 21X1.5 SAFETY (NEEDLE) IMPLANT
NEEDLE HYPO 25X1 1.5 SAFETY (NEEDLE) IMPLANT
NS IRRIG 1000ML POUR BTL (IV SOLUTION) ×2 IMPLANT
PACK LAMINECTOMY NEURO (CUSTOM PROCEDURE TRAY) ×2 IMPLANT
PAD ARMBOARD 7.5X6 YLW CONV (MISCELLANEOUS) ×6 IMPLANT
PATTIES SURGICAL .5 X1 (DISPOSABLE) ×1 IMPLANT
PATTIES SURGICAL .5 X3 (DISPOSABLE) IMPLANT
PATTIES SURGICAL 1X1 (DISPOSABLE) ×1 IMPLANT
ROD REVERE 6.35 45MM (Rod) ×2 IMPLANT
SCREW REVERE 5.5X45 (Screw) ×4 IMPLANT
SPACER SUSTAIN O SML 8X22 13MM (Peek) ×2 IMPLANT
SPONGE GAUZE 4X4 12PLY (GAUZE/BANDAGES/DRESSINGS) ×2 IMPLANT
SPONGE LAP 4X18 X RAY DECT (DISPOSABLE) IMPLANT
SPONGE NEURO XRAY DETECT 1X3 (DISPOSABLE) IMPLANT
SPONGE SURGIFOAM ABS GEL 100 (HEMOSTASIS) ×2 IMPLANT
STRIP CLOSURE SKIN 1/2X4 (GAUZE/BANDAGES/DRESSINGS) ×2 IMPLANT
SUT VIC AB 1 CT1 18XBRD ANBCTR (SUTURE) ×2 IMPLANT
SUT VIC AB 1 CT1 8-18 (SUTURE) ×4
SUT VIC AB 2-0 CP2 18 (SUTURE) ×3 IMPLANT
SUT VIC AB 3-0 SH 8-18 (SUTURE) ×2 IMPLANT
SYR 20CC LL (SYRINGE) IMPLANT
SYR 20ML ECCENTRIC (SYRINGE) ×2 IMPLANT
SYR 5ML LL (SYRINGE) IMPLANT
TAPE CLOTH SURG 4X10 WHT LF (GAUZE/BANDAGES/DRESSINGS) ×1 IMPLANT
TOWEL OR 17X24 6PK STRL BLUE (TOWEL DISPOSABLE) ×2 IMPLANT
TOWEL OR 17X26 10 PK STRL BLUE (TOWEL DISPOSABLE) ×2 IMPLANT
TRAY FOLEY CATH 14FRSI W/METER (CATHETERS) ×2 IMPLANT
WATER STERILE IRR 1000ML POUR (IV SOLUTION) ×2 IMPLANT

## 2012-05-02 NOTE — Anesthesia Preprocedure Evaluation (Addendum)
Anesthesia Evaluation  Patient identified by MRN, date of birth, ID band Patient awake    Reviewed: Allergy & Precautions, H&P , NPO status , Patient's Chart, lab work & pertinent test results  Airway Mallampati: II      Dental   Pulmonary neg pulmonary ROS,  breath sounds clear to auscultation        Cardiovascular negative cardio ROS  Rhythm:Regular Rate:Normal     Neuro/Psych Anxiety    GI/Hepatic Neg liver ROS, GERD-  ,  Endo/Other  negative endocrine ROS  Renal/GU negative Renal ROS     Musculoskeletal   Abdominal   Peds  Hematology   Anesthesia Other Findings   Reproductive/Obstetrics                           Anesthesia Physical Anesthesia Plan  ASA: II  Anesthesia Plan: General   Post-op Pain Management:    Induction: Intravenous  Airway Management Planned: Oral ETT  Additional Equipment:   Intra-op Plan:   Post-operative Plan: Extubation in OR  Informed Consent: I have reviewed the patients History and Physical, chart, labs and discussed the procedure including the risks, benefits and alternatives for the proposed anesthesia with the patient or authorized representative who has indicated his/her understanding and acceptance.   Dental advisory given  Plan Discussed with: CRNA, Anesthesiologist and Surgeon  Anesthesia Plan Comments:         Anesthesia Quick Evaluation

## 2012-05-02 NOTE — Preoperative (Signed)
Beta Blockers   Reason not to administer Beta Blockers:Not Applicable 

## 2012-05-02 NOTE — Anesthesia Postprocedure Evaluation (Signed)
Anesthesia Post Note  Patient: Michele Cooley  Procedure(s) Performed: Procedure(s) (LRB): POSTERIOR LUMBAR FUSION 1 LEVEL (N/A)  Anesthesia type: general  Patient location: PACU  Post pain: Pain level controlled  Post assessment: Patient's Cardiovascular Status Stable  Last Vitals:  Filed Vitals:   05/02/12 1954  BP:   Pulse:   Temp: 36.2 C  Resp:     Post vital signs: Reviewed and stable  Level of consciousness: sedated  Complications: No apparent anesthesia complications

## 2012-05-02 NOTE — Transfer of Care (Signed)
Immediate Anesthesia Transfer of Care Note  Patient: Michele Cooley  Procedure(s) Performed: Procedure(s) (LRB) with comments: POSTERIOR LUMBAR FUSION 1 LEVEL (N/A) - Lumbar four-five  Diskectomy, cages, posteriolateral arthrodesis, pedicle screws  Patient Location: PACU  Anesthesia Type:General  Level of Consciousness: awake, alert  and oriented  Airway & Oxygen Therapy: Patient Spontanous Breathing and Patient connected to nasal cannula oxygen  Post-op Assessment: Report given to PACU RN and Post -op Vital signs reviewed and stable  Post vital signs: Reviewed and stable  Complications: No apparent anesthesia complications

## 2012-05-02 NOTE — Anesthesia Procedure Notes (Signed)
Procedures

## 2012-05-02 NOTE — Progress Notes (Signed)
Op note [phone]

## 2012-05-02 NOTE — H&P (Signed)
Michele Cooley is an 46 y.o. female.   Chief Complaint: recurrent lumbar pain. HPI: patient who had two lumbar interventions at l45 ,the most recently in the spring of this year. She did well but suddenly she developed increase of lumbar pain associated with radiation to the right leg with weakness. Conservative treatment has been of no help. Mri showed recurrent hnp  Past Medical History  Diagnosis Date  . Neuromuscular disorder   . Leg pain, right   . Anxiety   . Arthritis     rheumatoid arthritis- hands, degeneration of lumbar  spine   . GERD (gastroesophageal reflux disease)   . Mental disorder   . Eye irritation     R eye- redness- from removing contact lense    Past Surgical History  Procedure Date  . Lumbar laminectomy/decompression microdiscectomy 10/18/2011    Procedure: LUMBAR LAMINECTOMY/DECOMPRESSION MICRODISCECTOMY;  Surgeon: Karn Cassis, MD;  Location: [facility];  Service: Neurosurgery;  Laterality: Right;  Redo Right Lumbar four-fivelaminectomy microdiskectomy  . Back surgery     09/2011, first back surgery- 2000    . Eye surgery     lasik procedure very long ago- not successful  . Cholecystectomy     09/2008    No family history on file. Social History:  reports that she has never smoked. She does not have any smokeless tobacco history on file. She reports that she drinks alcohol. She reports that she does not use illicit drugs.  Allergies:  Allergies  Allergen Reactions  . Codeine Itching    No prescriptions prior to admission    No results found for this or any previous visit (from the past 48 hour(s)). No results found.  Review of Systems  Constitutional: Negative.   HENT: Negative.   Eyes: Negative.   Respiratory: Negative.   Cardiovascular: Negative.   Gastrointestinal: Negative.   Genitourinary: Negative.   Musculoskeletal: Positive for back pain.  Skin: Negative.   Neurological: Positive for sensory change and focal weakness.   Endo/Heme/Allergies: Negative.   Psychiatric/Behavioral: Negative.     There were no vitals taken for this visit. Physical Exam hent, nl. Neck, nl. Cv, nl. Lungs, clear. Abdomen, soft. Extremities, nl, NEURO, weakness of right foot dorsiflexion, limping from the right leg. slr positive bilaterally. 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 Also a small hnp at l34.  Assessment/Plan Patient to have l45 decompression and fusion at l445. She is aware of risks and benefits  Jama Krichbaum M 05/02/2012, 9:16 AM

## 2012-05-03 MED ORDER — HYDROMORPHONE HCL 2 MG PO TABS
2.0000 mg | ORAL_TABLET | ORAL | Status: DC | PRN
  Administered 2012-05-04 (×2): 4 mg via ORAL
  Administered 2012-05-04: 3 mg via ORAL
  Administered 2012-05-04: 2 mg via ORAL
  Administered 2012-05-05: 4 mg via ORAL
  Filled 2012-05-03 (×3): qty 2
  Filled 2012-05-03: qty 1
  Filled 2012-05-03: qty 2

## 2012-05-03 NOTE — Progress Notes (Addendum)
Pt got up and out of bed this am with 1 assist and walked in the room with a walker.  Pt back to bed without incidence, and Foley D/C.

## 2012-05-03 NOTE — Progress Notes (Signed)
PCA d/c by other RN Kathlene November). Pt stated she doesn't like PCA. Will treat pain with PO med for now. Pt currently stable.

## 2012-05-03 NOTE — Clinical Social Work Note (Signed)
Clinical Social Work   CSW reviewed chart. PT is not recommending any follow up. RNCM is aware. CSW is signing off as no further needs identified. Please reconsult if a need arises prior to discharge.   Dede Query, MSW, Theresia Majors [phone]

## 2012-05-03 NOTE — Evaluation (Signed)
Occupational Therapy Evaluation Patient Details Name: Michele Cooley MRN: [id number] DOB: [birth year] Today's Date: 05/03/2012 Time: 4098-1191 OT Time Calculation (min): 31 min  OT Assessment / Plan / Recommendation Clinical Impression  Pt is a 46 yr old female admitted for lumbar fusion of 2 levels.  Currently overall supervision level for simulated selfcare tasks and functional transfers.  Has 24 hour supervision from her husband and all needed DME.  She has been educated on proper techniques and use of DME for selfcare tasks.  No further OT needs.    OT Assessment  Patient does not need any further OT services    Follow Up Recommendations  No OT follow up       Equipment Recommendations  None recommended by OT          Precautions / Restrictions Precautions Precautions: Back Precaution Booklet Issued: Yes (comment) Precaution Comments: pt educated on 3/3 back precautions Required Braces or Orthoses: Spinal Brace Spinal Brace: Lumbar corset;Applied in sitting position Restrictions Weight Bearing Restrictions: No   Pertinent Vitals/Pain Pt reports pain 2/10 in her back, HR 105 and O2 sats 98% on 2Ls nasal cannula    ADL  Eating/Feeding: Simulated;Independent Where Assessed - Eating/Feeding: Chair Grooming: Simulated;Supervision/safety Where Assessed - Grooming: Unsupported standing Upper Body Bathing: Simulated;Set up Where Assessed - Upper Body Bathing: Unsupported sitting Upper Body Dressing: Simulated;Set up Where Assessed - Upper Body Dressing: Unsupported sitting Lower Body Dressing: Performed;Set up;Other (comment) (with use of reacher and sockaide) Where Assessed - Lower Body Dressing: Unsupported sit to stand Toilet Transfer: Simulated;Supervision/safety Toilet Transfer Method: Other (comment) (ambulate with RW to comfort height commode) Toilet Transfer Equipment: Comfort height toilet Toileting - Clothing Manipulation and Hygiene:  Simulated;Supervision/safety;Other (comment) (with simulated use of long handle tongs) Where Assessed - Toileting Clothing Manipulation and Hygiene: Sit to stand from 3-in-1 or toilet Tub/Shower Transfer: Supervision/safety;Simulated Tub/Shower Transfer Method: Science writer: Shower seat with back Equipment Used: Reacher;Sock aid;Other (comment) (Long handle tongs) Transfers/Ambulation Related to ADLs: Pt is overall supervision with mobility using her RW. ADL Comments: Pt is overall supervision for simulated selfcare tasks using her RW and AE(reacher, sockaide, LH sponge, LH shoe horn, and LH tongs).  Was educated on use of tongs vs toilet wand to assist with toilet hygiene.  Able to state 3/3 back precautions independently.  She has elelvated toilet at home as well as shower seat and comfort height toilet with rails.  Educated on follwing back precautions for grooming tasks, selfcare tasks, and getting walker bag as well.      Visit Information  Last OT Received On: 05/03/12 Assistance Needed: +1    Subjective Data  Subjective: I'm not sure how to wipe my bottom. Patient Stated Goal: To be able to get back to her normal activities.   Prior Functioning     Home Living Lives With: Spouse Available Help at Discharge: Family;Available 24 hours/day Type of Home: House Home Access: Stairs to enter [employer] of Steps: 3 Entrance Stairs-Rails: Right;Left Home Layout: One level Bathroom Shower/Tub: Walk-in shower;Door [employer] Toilet: Standard Bathroom Accessibility: Yes How Accessible: Accessible via walker Home Adaptive Equipment: Grab bars around toilet;Grab bars in shower;Walker - rolling;Straight cane Prior Function Level of Independence: Needs assistance Needs Assistance: Dressing;Light Housekeeping Dressing: Minimal Light Housekeeping: Minimal Able to Take Stairs?: Yes Driving: Yes 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, theatre/television/film;  sitting Communication Communication: No difficulties Dominant Hand: Left         Vision/Perception Vision - Assessment Vision Assessment:  Vision not tested Perception Perception: Within Functional Limits Praxis Praxis: Intact   Cognition  Overall Cognitive Status: Appears within functional limits for tasks assessed/performed Arousal/Alertness: Awake/alert Orientation Level: Appears intact for tasks assessed Behavior During Session: [facility] for tasks performed    Extremity/Trunk Assessment Right Upper Extremity Assessment RUE ROM/Strength/Tone: Within functional levels;Unable to fully assess;Due to precautions (AROM WFLS) RUE Sensation: WFL - Light Touch RUE Coordination: WFL - gross/fine motor Left Upper Extremity Assessment LUE ROM/Strength/Tone: Within functional levels;Unable to fully assess;Due to precautions (AROM WFLS) LUE Sensation: WFL - Light Touch LUE Coordination: WFL - gross/fine motor Right Lower Extremity Assessment RLE ROM/Strength/Tone: Within functional levels RLE Sensation: WFL - Light Touch Left Lower Extremity Assessment LLE ROM/Strength/Tone: Within functional levels LLE Sensation: WFL - Light Touch Trunk Assessment Trunk Assessment: Normal     Mobility Bed Mobility Bed Mobility: Rolling Right;Right Sidelying to Sit;Sitting - Scoot to Edge of Bed Rolling Right: 4: Min assist;With rail Right Sidelying to Sit: 4: Min assist;With rails Sitting - Scoot to Edge of Bed: 4: Min guard Details for Bed Mobility Assistance: Min assist through trunk into sidelying. Cues for safe technique to maintain back precautions during transfer. Transfers Sit to Stand: 5: Supervision;With upper extremity assist;From bed Stand to Sit: 5: Supervision;With upper extremity assist;To chair/3-in-1 Details for Transfer Assistance: VC for safe technique to avoid bending while standing. Cues for hand placement to/from Rw           Balance Balance Balance Assessed: Yes Static  Standing Balance Static Standing - Balance Support: No upper extremity supported Static Standing - Level of Assistance: 5: Stand by assistance Static Standing - Comment/# of Minutes: Pt standing at sink during brushing teeth and washing face, SBA only   End of Session OT - End of Session Activity Tolerance: Patient tolerated treatment well Patient left: in chair;with call bell/phone within reach Nurse Communication: Mobility status   Adel Burch OTR/L Pager number [phone] 05/03/2012, 11:01 AM

## 2012-05-03 NOTE — Progress Notes (Signed)
Patient ID: Michele Cooley, female   DOB: 15-Sep-1965, 46 y.o.   MRN: [id number] Doing well, no weakness . Sensory normal

## 2012-05-03 NOTE — Evaluation (Signed)
Physical Therapy Evaluation Patient Details Name: Michele Cooley MRN: [id number] DOB: [birth year] Today's Date: 05/03/2012 Time: 4098-1191 PT Time Calculation (min): 29 min  PT Assessment / Plan / Recommendation Clinical Impression  Pt s/p PLF L4-5. Pt moving well this session, distance limited by pain. Pt will benefit from skilled PT in the acute care setting in order to maximize functional mobility and safety prior to d/c home with husband    PT Assessment  Patient needs continued PT services    Follow Up Recommendations  No PT follow up;Supervision for mobility/OOB    Does the patient have the potential to tolerate intense rehabilitation      Barriers to Discharge        Equipment Recommendations  None recommended by PT    Recommendations for Other Services     Frequency Min 5X/week    Precautions / Restrictions Precautions Precautions: Back Precaution Booklet Issued: Yes (comment) Precaution Comments: pt educated on 3/3 back precautions Required Braces or Orthoses: Spinal Brace Spinal Brace: Lumbar corset;Applied in sitting position Restrictions Weight Bearing Restrictions: No   Pertinent Vitals/Pain Pain 5/10. PCA encouraged.      Mobility  Bed Mobility Bed Mobility: Rolling Right;Right Sidelying to Sit;Sitting - Scoot to Edge of Bed Rolling Right: 4: Min assist;With rail Right Sidelying to Sit: 4: Min assist;With rails Sitting - Scoot to Edge of Bed: 4: Min guard Details for Bed Mobility Assistance: Min assist through trunk into sidelying. Cues for safe technique to maintain back precautions during transfer. Transfers Transfers: Sit to Stand;Stand to Sit Sit to Stand: 4: Min assist;With upper extremity assist;From bed Stand to Sit: 4: Min assist;With upper extremity assist;To chair/3-in-1 Details for Transfer Assistance: VC for safe technique to avoid bending while standing. Cues for hand placement to/from Rw Ambulation/Gait Ambulation/Gait Assistance: 4: Min  assist Ambulation Distance (Feet): 200 Feet Assistive device: Rolling walker Ambulation/Gait Assistance Details: VC for safe technique with RW.  Gait Pattern: Step-to pattern;Decreased stride length;Decreased hip/knee flexion - right;Decreased hip/knee flexion - left;Narrow base of support Gait velocity: decreased gait speed Stairs: No    Shoulder Instructions     Exercises     PT Diagnosis: Difficulty walking;Acute pain  PT Problem List: Decreased activity tolerance;Decreased mobility;Decreased knowledge of use of DME;Decreased safety awareness;Decreased knowledge of precautions;Pain PT Treatment Interventions: DME instruction;Gait training;Stair training;Functional mobility training;Therapeutic activities;Patient/family education   PT Goals Acute Rehab PT Goals PT Goal Formulation: With patient Time For Goal Achievement: 05/10/12 Potential to Achieve Goals: Good Pt will go Supine/Side to Sit: with modified independence PT Goal: Supine/Side to Sit - Progress: Goal set today Pt will go Sit to Supine/Side: with modified independence PT Goal: Sit to Supine/Side - Progress: Goal set today Pt will go Sit to Stand: with modified independence PT Goal: Sit to Stand - Progress: Goal set today Pt will go Stand to Sit: with modified independence PT Goal: Stand to Sit - Progress: Goal set today Pt will Transfer Bed to Chair/Chair to Bed: with modified independence PT Transfer Goal: Bed to Chair/Chair to Bed - Progress: Goal set today Pt will Ambulate: >150 feet;with modified independence;with least restrictive assistive device PT Goal: Ambulate - Progress: Goal set today Pt will Go Up / Down Stairs: 3-5 stairs;with rail(s);with modified independence PT Goal: Up/Down Stairs - Progress: Goal set today  Visit Information  Last PT Received On: 05/03/12 Assistance Needed: +1    Subjective Data  Subjective: pt feeling well in bed Patient Stated Goal: to go home   Prior  Functioning  Home  Living Lives With: Spouse Available Help at Discharge: Family;Available 24 hours/day Type of Home: House Home Access: Stairs to enter [employer] of Steps: 3 Entrance Stairs-Rails: Right;Left Home Layout: One level Bathroom Shower/Tub: Walk-in shower;Door [employer] Toilet: Standard Bathroom Accessibility: Yes How Accessible: Accessible via walker Home Adaptive Equipment: Grab bars around toilet;Grab bars in shower;Walker - rolling;Straight cane Prior Function Level of Independence: Needs assistance Needs Assistance: Dressing;Light Housekeeping Dressing: Minimal Light Housekeeping: Minimal Able to Take Stairs?: Yes Driving: Yes 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, theatre/television/film; sitting Communication Communication: No difficulties Dominant Hand: Left    Cognition  Overall Cognitive Status: Appears within functional limits for tasks assessed/performed Arousal/Alertness: Awake/alert Orientation Level: Appears intact for tasks assessed Behavior During Session: [facility] for tasks performed    Extremity/Trunk Assessment Right Lower Extremity Assessment RLE ROM/Strength/Tone: Within functional levels RLE Sensation: WFL - Light Touch Left Lower Extremity Assessment LLE ROM/Strength/Tone: Within functional levels LLE Sensation: WFL - Light Touch   Balance Balance Balance Assessed: Yes Static Standing Balance Static Standing - Balance Support: Bilateral upper extremity supported;During functional activity Static Standing - Level of Assistance: 5: Stand by assistance Static Standing - Comment/# of Minutes: Pt standing at sink during brushing teeth and washing face, SBA only  End of Session PT - End of Session Equipment Utilized During Treatment: Gait belt Activity Tolerance: Patient tolerated treatment well Patient left: in chair;with call bell/phone within reach;Other (comment) (with OT) Nurse Communication: Mobility status  GP     [name] 05/03/2012,  10:48 AM  05/03/2012 [name] DPT PAGER: [phone] OFFICE: [phone]

## 2012-05-03 NOTE — Clinical Social Work Note (Signed)
Clinical Social Work   CSW received consult for SNF. CSW reviewed chart and discussed pt with RN during progression. Awaiting PT evals for discharge recommendations. CSW will assess pt for SNF, if appropriate. CSW will continue to follow for discharge planning.   Dede Query, MSW, Theresia Majors [phone]

## 2012-05-03 NOTE — Op Note (Signed)
NAMESKARLETTE, LATTNER NO.:  [id number]  MEDICAL RECORD NO.:  [id number]  LOCATION:  [id number]                        FACILITY:  [facility]  PHYSICIAN:  Hilda Lias, M.D.   DATE OF BIRTH:  Jan 23, 1966  DATE OF PROCEDURE:  05/02/2012 DATE OF DISCHARGE:                              OPERATIVE REPORT   PREOPERATIVE DIAGNOSIS:  Recurrent L4-L5 herniated disk with a chronic on acute radiculopathy, right side.  Degenerative disk disease.  POSTOPERATIVE DIAGNOSIS:  Recurrent L4-L5 herniated disk with a chronic on acute radiculopathy, right side.  Degenerative disk disease.  PROCEDURE:  L4-L5 laminectomy and facetectomy, bilaterally; L4-L5 diskectomy more than normal we could do to be able to insert the 2 cages.  Pedicle screws L4-L5; posterolateral arthrodesis with autograft and bone extender.  Lysis of adhesions.  C-arm and Cell Saver.  SURGEON:  Hilda Lias, M.D.  ASSISTANT:  Donalee Citrin, M.D.  CLINICAL HISTORY:  Michele Cooley is a 46 year old female who about 25 years ago underwent surgery at the level of L4-L5 disk.  In the spring of this year, I saw her in my office with a recurrent disk.  Diskectomy was done, and the patient did fairly well, but later on for the past several weeks, she had been complaining of recurrent pain going to the right leg associated with weakness, 3/5 on dorsiflexion.  X-ray showed degenerative disk disease at the level of L4-L5 with recurrent herniated disk.  Because of the history, surgery was advised in view of no improvement with conservative treatment.  The patient knew the risk with the surgery including the recurrence, being unable to improve, infection, CSF leak, and hematoma.  DESCRIPTION OF PROCEDURE:  The patient was taken to the OR, and after intubation, she was positioned in a prone manner.  The back was cleaned with Betadine and DuraPrep.  Drapes were applied.  Incision resecting the previous scar was done from L4 down to L5-S1.   Muscles and fascia were retracted all the way laterally.  We found quite a bit of adhesion on the right side.  X-ray showed that indeed we were at the level of L4- L5.  From then on, we proceed with removal of spinous process of L4 and the lamina.  Also facetectomy was done.  We approached the disk space from the left side.  The patient had quite a bit of adhesion all on the right side, but also going into the left side.  Retraction was made over the thecal sac, and we entered the disk space.  Total gross diskectomy was done.  On the right side, it took [place] at least half an hour to be able to dissect the thecal sac as well as the L4 and L5 nerve root. Once this was done, we entered the disk space.  We did total diskectomy going all the way laterally more than normal we could do to be able to insert 2 cages.  The endplates were drilled and 2 cages of 12 x 22 were inserted.  The rest of the disk space was filled up with bone extender and autograft.  Then, using the C-arm in AP view and later on the lateral view, we  made small holes in the pedicle of L4-L5.  Prior to insertion of the screws, we felt the hole just to be sure that we surrounded by bone.  Four screws of 5.5 x 45 were inserted followed by a rod and Capps.  Cross-link from right to left was done.  Then, lateral aspect of the facet L4-L5, the periosteum was removed, and a mix of autograft bone extender were used for arthrodesis.  Valsalva maneuver was negative.  Prior to closure, we felt the foramina to be sure that the L4 and L5 were not compromised, and there was plenty of space.  Hemostasis was done with bipolar.  From then on, the wound was closed with Vicryl and Dermabond.  The patient did well after surgery.  I went twice to the waiting area.  I was unable to find any family members.          ______________________________ Hilda Lias, M.D.     EB/MEDQ  D:  05/02/2012  T:  05/03/2012  Job:  [id number]

## 2012-05-04 MED ORDER — FLEET ENEMA 7-19 GM/118ML RE ENEM: 1.0000 | ENEMA | Freq: Every day | RECTAL | Status: DC | PRN

## 2012-05-04 NOTE — Progress Notes (Signed)
Spoke with patient today regarding Link to Wellness as a benefit of being a [employer] employee with [employer] insurance. Left information packet as well as contact information. Mrs Cerveny reports she would like more information on Nutritional Counseling. Provided her with the proper contacts for her to call. Appreciative of information.    Raiford Noble, [facility] Liaison [phone]

## 2012-05-04 NOTE — Progress Notes (Signed)
Physical Therapy Treatment Patient Details Name: Michele Cooley MRN: [id number] DOB: Mar 10, 1966 Today's Date: 05/04/2012 Time: 4098-1191 PT Time Calculation (min): 11 min  PT Assessment / Plan / Recommendation Comments on Treatment Session  Pt progressing well. Pt near independent with ambulation. Pt still requiring minimal cueing throughout for safe technique. Plan to d/c tomorrow    Follow Up Recommendations  No PT follow up;Supervision for mobility/OOB     Does the patient have the potential to tolerate intense rehabilitation     Barriers to Discharge        Equipment Recommendations  None recommended by OT;None recommended by PT    Recommendations for Other Services    Frequency Min 5X/week   Plan Discharge plan remains appropriate;Frequency remains appropriate    Precautions / Restrictions Precautions Precautions: Back Required Braces or Orthoses: Spinal Brace Spinal Brace: Lumbar corset;Applied in sitting position   Pertinent Vitals/Pain Pain 4/10 in back.     Mobility  Bed Mobility Bed Mobility: Not assessed Transfers Transfers: Sit to Stand;Stand to Sit Sit to Stand: 5: Supervision;With upper extremity assist;From chair/3-in-1 Stand to Sit: 5: Supervision;With upper extremity assist;To chair/3-in-1 Details for Transfer Assistance: VC for safe hand placement to/from standing Ambulation/Gait Ambulation/Gait Assistance: 5: Supervision Ambulation Distance (Feet): 200 Feet Assistive device: Rolling walker;None Ambulation/Gait Assistance Details: Pt ambulated first half of session with RW, second half without RW. Pt with no gait deficits this session, with good posture Gait Pattern: Step-to pattern;Decreased stride length;Decreased hip/knee flexion - right;Decreased hip/knee flexion - left;Narrow base of support Gait velocity: decreased gait speed Stairs: Yes Stairs Assistance: 5: Supervision Stairs Assistance Details (indicate cue type and reason): VC for proper  sequencing and safety Stair Management Technique: One rail Left;Step to pattern;Forwards Number of Stairs: 12     Exercises     PT Diagnosis:    PT Problem List:   PT Treatment Interventions:     PT Goals Acute Rehab PT Goals PT Goal: Sit to Stand - Progress: Progressing toward goal PT Goal: Stand to Sit - Progress: Progressing toward goal PT Transfer Goal: Bed to Chair/Chair to Bed - Progress: Progressing toward goal PT Goal: Ambulate - Progress: Progressing toward goal PT Goal: Up/Down Stairs - Progress: Progressing toward goal  Visit Information  Last PT Received On: 05/04/12 Assistance Needed: +1    Subjective Data      Cognition  Overall Cognitive Status: Appears within functional limits for tasks assessed/performed Arousal/Alertness: Awake/alert Orientation Level: Appears intact for tasks assessed Behavior During Session: [facility] for tasks performed    Balance     End of Session PT - End of Session Equipment Utilized During Treatment: Gait belt;Back brace Activity Tolerance: Patient tolerated treatment well Patient left: in chair;with call bell/phone within reach;Other (comment) Nurse Communication: Mobility status   GP     [name] 05/04/2012, 4:06 PM

## 2012-05-04 NOTE — Progress Notes (Signed)
Orthopedic Tech Progress Note Patient Details:  Michele Cooley [birth year] [id number]  Patient ID: [name], female   DOB: [birth year], 46 y.o.   MRN: [id number]   [name] 05/04/2012, 12:13 PM CALLED [facility] FOR LUMBAR CORSET.

## 2012-05-04 NOTE — Progress Notes (Addendum)
Brace delivered to room.

## 2012-05-04 NOTE — Progress Notes (Signed)
Orthopedic Tech Progress Note Patient Details:  Michele Cooley 1965-10-06 [id number]  Patient ID: [name], female   DOB: 02-Nov-1965, 46 y.o.   MRN: [id number]   [name] 05/04/2012, 2:09 PM Lumbar corset completed by [facility].

## 2012-05-04 NOTE — Progress Notes (Signed)
Patient ID: Michele Cooley, female   DOB: [birth year], 46 y.o.   MRN: [id number] Stable, wound dry. C/o constipation. No weakness.

## 2012-05-05 MED ORDER — OXYCODONE-ACETAMINOPHEN 5-325 MG PO TABS: 1.0000 | ORAL_TABLET | ORAL | Status: DC | PRN

## 2012-05-05 MED ORDER — DIAZEPAM 5 MG PO TABS: 5.0000 mg | ORAL_TABLET | Freq: Four times a day (QID) | ORAL | Status: DC | PRN

## 2012-05-05 MED ORDER — HYDROMORPHONE HCL 2 MG PO TABS: 2.0000 mg | ORAL_TABLET | ORAL | Status: DC | PRN

## 2012-05-05 NOTE — Discharge Summary (Signed)
Physician Discharge Summary  Patient ID: Michele Cooley MRN: [id number] DOB/AGE: December 20, 1965 46 y.o.  Admit date: 05/02/2012 Discharge date: 05/05/2012  Admission Diagnoses:  Discharge Diagnoses:  Active Problems:  * No active hospital problems. *    Discharged Condition: good  Hospital Course: Patient in the hospital where she underwent uncomplicated lumbar depression and fusion for postop done very well. Back and lower trimming much improved. Sensation intact. Wound healing well. For discharge home.  Consults:   Significant Diagnostic Studies:   Treatments:   Discharge Exam: Blood pressure 128/76, pulse 89, temperature 99.3 F (37.4 C), temperature source Oral, resp. rate 18, height 5\' 6"  (1.676 m), weight 84.8 kg (186 lb 15.2 oz), last menstrual period 04/25/2012, SpO2 99.00%. Awake and alert oriented and appropriate. Cranial nerve function is intact. Motor sensory function extremities normal. Wound clean dry and intact. Chest abdomen benign.  Disposition: 01-Home or Self Care     Medication List     As of 05/05/2012 10:15 AM    TAKE these medications         acyclovir 400 MG tablet   Commonly known as: ZOVIRAX   Take 400 mg by mouth 2 (two) times daily.      ALPRAZolam 0.5 MG tablet   Commonly known as: XANAX   Take 0.25-0.5 mg by mouth 3 (three) times daily as needed. For anxiety      buPROPion 300 MG 24 hr tablet   Commonly known as: WELLBUTRIN XL   Take 300 mg by mouth daily before breakfast.      butalbital-acetaminophen-caffeine 50-325-40 MG per tablet   Commonly known as: FIORICET, ESGIC   Take 1 tablet by mouth 2 (two) times daily as needed. For migraine      diazepam 5 MG tablet   Commonly known as: VALIUM   Take 5 mg by mouth every 8 (eight) hours.      diazepam 5 MG tablet   Commonly known as: VALIUM   Take 1 tablet (5 mg total) by mouth every 6 (six) hours as needed.      escitalopram 10 MG tablet   Commonly known as: LEXAPRO   Take 10 mg by  mouth daily before breakfast.      HYDROmorphone 4 MG tablet   Commonly known as: DILAUDID   Take 4 mg by mouth every 4 (four) hours as needed. For pain      hydroxychloroquine 200 MG tablet   Commonly known as: PLAQUENIL   Take 400 mg by mouth daily before breakfast.      leflunomide 10 MG tablet   Commonly known as: ARAVA   Take 10 mg by mouth daily before breakfast.      loratadine 10 MG tablet   Commonly known as: CLARITIN   Take 10 mg by mouth daily as needed.      meloxicam 15 MG tablet   Commonly known as: MOBIC   Take 15 mg by mouth daily as needed.      norgestimate-ethinyl estradiol 0.25-35 MG-MCG tablet   Commonly known as: ORTHO-CYCLEN,SPRINTEC,PREVIFEM   Take 1 tablet by mouth daily.      omeprazole 20 MG capsule   Commonly known as: PRILOSEC   Take 20 mg by mouth daily as needed.      oxyCODONE-acetaminophen 5-325 MG per tablet   Commonly known as: PERCOCET/ROXICET   Take 1-2 tablets by mouth every 4 (four) hours as needed.      SOMA 250 MG tablet   Generic drug:  carisoprodol   Take 250 mg by mouth daily as needed. For pain           Follow-up Information    Follow up with Karn Cassis, MD. Call in 1 week.   Contact information:   [address] [place] [address] [phone]          Signed: Temple Pacini 05/05/2012, 10:15 AM

## 2012-05-05 NOTE — Progress Notes (Signed)
DC instructions given to pt at this time re:  F/u appts, diet, s/s of problems, spinal precautions, community resources, medications, and MyChart.  Pt verbalized understanding of all instructions.  No s/s of any acute distress at dc.

## 2012-05-07 MED FILL — Heparin Sodium (Porcine) Inj 1000 Unit/ML: INTRAMUSCULAR | Qty: 30 | Status: AC

## 2012-05-07 MED FILL — Sodium Chloride IV Soln 0.9%: INTRAVENOUS | Qty: 1000 | Status: AC

## 2012-05-07 MED FILL — Sodium Chloride Irrigation Soln 0.9%: Qty: 3000 | Status: AC

## 2012-06-21 ENCOUNTER — Other Ambulatory Visit (HOSPITAL_COMMUNITY)
Admission: RE | Admit: 2012-06-21 | Discharge: 2012-06-21 | Disposition: A | Discharge status: Home / Self Care | Payer: 59 | Source: Ambulatory Visit | Attending: Physician Assistant | Admitting: Physician Assistant

## 2012-06-21 ENCOUNTER — Other Ambulatory Visit: Payer: Self-pay

## 2012-06-21 DIAGNOSIS — N760 Acute vaginitis: Secondary | ICD-10-CM | POA: Insufficient documentation

## 2012-06-21 DIAGNOSIS — Z1151 Encounter for screening for human papillomavirus (HPV): Secondary | ICD-10-CM | POA: Insufficient documentation

## 2012-06-21 DIAGNOSIS — Z01419 Encounter for gynecological examination (general) (routine) without abnormal findings: Secondary | ICD-10-CM | POA: Insufficient documentation

## 2012-06-25 ENCOUNTER — Other Ambulatory Visit (HOSPITAL_COMMUNITY): Payer: Self-pay | Admitting: Internal Medicine

## 2012-06-25 DIAGNOSIS — Z1231 Encounter for screening mammogram for malignant neoplasm of breast: Secondary | ICD-10-CM

## 2012-06-27 ENCOUNTER — Ambulatory Visit (HOSPITAL_COMMUNITY): Payer: 59

## 2012-06-28 ENCOUNTER — Encounter (HOSPITAL_COMMUNITY): Payer: Self-pay

## 2012-06-28 ENCOUNTER — Ambulatory Visit (HOSPITAL_COMMUNITY)
Admission: RE | Admit: 2012-06-28 | Discharge: 2012-06-28 | Disposition: A | Discharge status: Home / Self Care | Payer: 59 | Source: Ambulatory Visit | Attending: Internal Medicine | Admitting: Internal Medicine

## 2012-06-28 DIAGNOSIS — Z1231 Encounter for screening mammogram for malignant neoplasm of breast: Secondary | ICD-10-CM | POA: Insufficient documentation

## 2012-07-14 ENCOUNTER — Other Ambulatory Visit: Payer: Self-pay

## 2013-04-04 ENCOUNTER — Other Ambulatory Visit: Payer: Self-pay

## 2013-04-12 ENCOUNTER — Encounter: Payer: Self-pay | Admitting: Physician Assistant

## 2013-04-12 DIAGNOSIS — E785 Hyperlipidemia, unspecified: Secondary | ICD-10-CM | POA: Insufficient documentation

## 2013-04-12 DIAGNOSIS — E669 Obesity, unspecified: Secondary | ICD-10-CM

## 2013-04-12 DIAGNOSIS — K219 Gastro-esophageal reflux disease without esophagitis: Secondary | ICD-10-CM

## 2013-04-12 DIAGNOSIS — I10 Essential (primary) hypertension: Secondary | ICD-10-CM

## 2013-04-16 ENCOUNTER — Ambulatory Visit: Payer: Self-pay | Admitting: Physician Assistant

## 2013-04-30 ENCOUNTER — Telehealth: Payer: Self-pay | Admitting: *Deleted

## 2013-04-30 NOTE — Telephone Encounter (Signed)
Try to increase the lexapro to 20mg  daily and see if this helps.

## 2013-04-30 NOTE — Telephone Encounter (Signed)
Patient aware of instruction per Marchelle Folks to increase Lexapro to 20 mg, states will try it

## 2013-04-30 NOTE — Telephone Encounter (Signed)
Pt is calling & asking is there anything that she can do or a medication to help with hot flashes? Says they have become less frequent but when she has 1 its like she just got out of the shower. Says its very hard to work when she has sweat just pouring  off  Her. Asking for any advise.     FYI pts pharm is [facility] out pt pharm  ( in case we call something in)

## 2013-05-06 ENCOUNTER — Other Ambulatory Visit: Payer: Self-pay | Admitting: Emergency Medicine

## 2013-05-14 ENCOUNTER — Encounter: Payer: Self-pay | Admitting: Certified Nurse Midwife

## 2013-05-21 ENCOUNTER — Other Ambulatory Visit: Payer: Self-pay | Admitting: Physician Assistant

## 2013-05-22 ENCOUNTER — Other Ambulatory Visit: Payer: Self-pay

## 2013-05-22 MED ORDER — ATENOLOL 100 MG PO TABS: ORAL_TABLET | ORAL | Status: DC

## 2013-06-04 ENCOUNTER — Other Ambulatory Visit: Payer: Self-pay | Admitting: Emergency Medicine

## 2013-06-12 ENCOUNTER — Other Ambulatory Visit: Payer: Self-pay | Admitting: Physician Assistant

## 2013-06-24 ENCOUNTER — Encounter: Payer: Self-pay | Admitting: Physician Assistant

## 2013-06-24 ENCOUNTER — Ambulatory Visit (INDEPENDENT_AMBULATORY_CARE_PROVIDER_SITE_OTHER): Payer: 59 | Admitting: Physician Assistant

## 2013-06-24 VITALS — BP 138/84 | HR 88 | Temp 98.5°F | Resp 16 | Ht 66.0 in | Wt 204.0 lb

## 2013-06-24 DIAGNOSIS — R197 Diarrhea, unspecified: Secondary | ICD-10-CM

## 2013-06-24 DIAGNOSIS — Z0000 Encounter for general adult medical examination without abnormal findings: Secondary | ICD-10-CM

## 2013-06-24 DIAGNOSIS — I10 Essential (primary) hypertension: Secondary | ICD-10-CM

## 2013-06-24 DIAGNOSIS — R232 Flushing: Secondary | ICD-10-CM

## 2013-06-24 DIAGNOSIS — R002 Palpitations: Secondary | ICD-10-CM

## 2013-06-24 LAB — TSH: TSH: 0.916 u[IU]/mL (ref 0.350–4.500)

## 2013-06-24 LAB — BASIC METABOLIC PANEL WITH GFR
BUN: 12 mg/dL (ref 6–23)
CO2: 22 mEq/L (ref 19–32)
CREATININE: 0.7 mg/dL (ref 0.50–1.10)
Calcium: 8.8 mg/dL (ref 8.4–10.5)
Chloride: 105 mEq/L (ref 96–112)
GLUCOSE: 149 mg/dL — AB (ref 70–99)
Potassium: 3.7 mEq/L (ref 3.5–5.3)
Sodium: 141 mEq/L (ref 135–145)

## 2013-06-24 LAB — MAGNESIUM: Magnesium: 1.7 mg/dL (ref 1.5–2.5)

## 2013-06-24 LAB — LIPID PANEL
Cholesterol: 296 mg/dL — ABNORMAL HIGH (ref 0–200)
HDL: 115 mg/dL (ref >39)
LDL CALC: 160 mg/dL — AB (ref 0–99)
TRIGLYCERIDES: 106 mg/dL (ref <150)
Total CHOL/HDL Ratio: 2.6 Ratio
VLDL: 21 mg/dL (ref 0–40)

## 2013-06-24 LAB — FERRITIN: Ferritin: 131 ng/mL (ref 10–291)

## 2013-06-24 LAB — CBC WITH DIFFERENTIAL/PLATELET
BASOS ABS: 0.1 10*3/uL (ref 0.0–0.1)
BASOS PCT: 2 % — AB (ref 0–1)
EOS ABS: 0.1 10*3/uL (ref 0.0–0.7)
EOS PCT: 3 % (ref 0–5)
HEMATOCRIT: 38.8 % (ref 36.0–46.0)
HEMOGLOBIN: 13.2 g/dL (ref 12.0–15.0)
Lymphocytes Relative: 21 % (ref 12–46)
Lymphs Abs: 0.8 10*3/uL (ref 0.7–4.0)
MCH: 33.1 pg (ref 26.0–34.0)
MCHC: 34 g/dL (ref 30.0–36.0)
MCV: 97.2 fL (ref 78.0–100.0)
MONO ABS: 0.5 10*3/uL (ref 0.1–1.0)
MONOS PCT: 14 % — AB (ref 3–12)
Neutro Abs: 2.3 10*3/uL (ref 1.7–7.7)
Neutrophils Relative %: 60 % (ref 43–77)
Platelets: 201 10*3/uL (ref 150–400)
RBC: 3.99 MIL/uL (ref 3.87–5.11)
RDW: 14.1 % (ref 11.5–15.5)
WBC: 3.8 10*3/uL — ABNORMAL LOW (ref 4.0–10.5)

## 2013-06-24 LAB — HEPATIC FUNCTION PANEL
ALBUMIN: 4.2 g/dL (ref 3.5–5.2)
ALT: 35 U/L (ref 0–35)
AST: 45 U/L — ABNORMAL HIGH (ref 0–37)
Alkaline Phosphatase: 93 U/L (ref 39–117)
BILIRUBIN INDIRECT: 0.2 mg/dL (ref 0.0–0.9)
Bilirubin, Direct: 0.1 mg/dL (ref 0.0–0.3)
TOTAL PROTEIN: 7.1 g/dL (ref 6.0–8.3)
Total Bilirubin: 0.3 mg/dL (ref 0.3–1.2)

## 2013-06-24 LAB — IRON AND TIBC
%SAT: 28 % (ref 20–55)
Iron: 112 ug/dL (ref 42–145)
TIBC: 396 ug/dL (ref 250–470)
UIBC: 284 ug/dL (ref 125–400)

## 2013-06-24 LAB — HEMOGLOBIN A1C
Hgb A1c MFr Bld: 5.3 % (ref <5.7)
MEAN PLASMA GLUCOSE: 105 mg/dL (ref <117)

## 2013-06-24 LAB — VITAMIN B12: VITAMIN B 12: 724 pg/mL (ref 211–911)

## 2013-06-24 MED ORDER — PANTOPRAZOLE SODIUM 40 MG PO TBEC: 40.0000 mg | DELAYED_RELEASE_TABLET | Freq: Every day | ORAL | Status: DC

## 2013-06-24 MED ORDER — COLESEVELAM HCL 3.75 G PO PACK: PACK | ORAL | Status: DC

## 2013-06-24 NOTE — Progress Notes (Signed)
Complete Physical HPI Patient presents for complete physical.   Patient's blood pressure has been controlled at home. Patient denies chest pain,dizziness. BP: 138/84 mmHg  She states that she has constant diarrhea 5-6 times day, she has flushing, BP goes up, sweating, and palpitations nonexertional that will last for 20 mins for the last 3-6 months.  Patient's cholesterol is diet controlled. The patient's cholesterol last visit was LDL 148.  The patient has been working on diet and exercise for prediabetes, denies changes in vision, polys, and paresthesias. Last A1C in office was 4.9.  On B12 sublingual which she feels is helping, B12 322. Vitamin D was 63.    Current Medications:  Current Outpatient Prescriptions on File Prior to Visit  Medication Sig Dispense Refill  . acyclovir (ZOVIRAX) 400 MG tablet Take 400 mg by mouth 2 (two) times daily.       Marland Kitchen ALPRAZolam (XANAX) 0.5 MG tablet TAKE 1 TABLET BY MOUTH THREE TIMES A DAY AS NEEDED  90 tablet  0  . aspirin 81 MG tablet Take 81 mg by mouth daily.      Marland Kitchen atenolol (TENORMIN) 100 MG tablet Take One tablet twice daily , one in a.m. , and one in p.m.  180 tablet  0  . buPROPion (WELLBUTRIN XL) 300 MG 24 hr tablet Take 300 mg by mouth daily before breakfast.       . butalbital-acetaminophen-caffeine (FIORICET, ESGIC) 50-325-40 MG per tablet Take 1 tablet by mouth 2 (two) times daily as needed. For migraine      . Cyanocobalamin (B-12) 1000 MCG SUBL Place under the tongue.      . escitalopram (LEXAPRO) 10 MG tablet Take 10 mg by mouth daily before breakfast.       . estradiol-norethindrone (ACTIVELLA) 1-0.5 MG per tablet TAKE 1 TABLET BY MOUTH ONCE DAILY  28 tablet  0  . hydroxychloroquine (PLAQUENIL) 200 MG tablet Take 400 mg by mouth daily before breakfast.       . hyoscyamine (LEVSIN, ANASPAZ) 0.125 MG tablet Take 0.125 mg by mouth every 4 (four) hours as needed.      . leflunomide (ARAVA) 10 MG tablet Take 10 mg by mouth daily before  breakfast.       . omeprazole (PRILOSEC) 20 MG capsule Take 20 mg by mouth daily as needed.        No current facility-administered medications on file prior to visit.   Health Maintenance:  Tetanus: 2013 Pneumovax: 1997 Flu vaccine: 03/2013 at work Zostavax: N/A  Pap: 06/22/2012 normal  MGM: 06/28/2012 DEXA: N/A  Colonoscopy: EGD: Allergies:  Allergies  Allergen Reactions  . Codeine Itching   Medical History:  Past Medical History  Diagnosis Date  . Neuromuscular disorder   . Leg pain, right   . Anxiety   . Arthritis     rheumatoid arthritis- hands, degeneration of lumbar  spine   . GERD (gastroesophageal reflux disease)   . Mental disorder   . Eye irritation     R eye- redness- from removing contact lense  . Hypertension   . Hyperlipidemia   . Allergy   . Herpes simplex type II infection   . Obesity   . Depression    Surgical History:  Past Surgical History  Procedure Laterality Date  . Lumbar laminectomy/decompression microdiscectomy  10/18/2011    Procedure: LUMBAR LAMINECTOMY/DECOMPRESSION MICRODISCECTOMY;  Surgeon: Karn Cassis, MD;  Location: [facility];  Service: Neurosurgery;  Laterality: Right;  Redo Right Lumbar four-fivelaminectomy microdiskectomy  .  Back surgery      09/2011, first back surgery- 2000    . Eye surgery      lasik procedure very long ago- not successful  . Cholecystectomy      09/2008  . Parotidectomy Left    Family History:  Family History  Problem Relation Age of Onset  . Hypertension Mother   . Stroke Mother   . COPD Father   . Heart disease Father   . Heart disease Sister    Social History:  History   Social History  . Marital Status: Married    Spouse Name: N/A    Number of Children: N/A  . Years of Education: N/A   Occupational History  . Not on file.   Social History Main Topics  . Smoking status: Never Smoker   . Smokeless tobacco: Never Used  . Alcohol Use: Yes     Comment: on holidays only  . Drug  Use: No  . Sexual Activity: Not on file   Other Topics Concern  . Not on file   Social History Narrative  . No narrative on file   ROS Constitutional: Denies weight loss/gain, headaches, insomnia, fatigue, night sweats, and change in appetite. Eyes: + dry eyes Denies redness, blurred vision, diplopia, discharge, itchy, watery eyes.  ENT: + post nasal drip Denies discharge, congestion, post nasal drip, sore throat, earache, hearing loss, dental pain, Tinnitus, Vertigo, Sinus pain, snoring.  Cardio: + palpitations with flushing, SOB when she gets home from work, non exertional, xanax does not help, atenolol helps Denies chest pain, palpitations, irregular heartbeat, dyspnea, diaphoresis, orthopnea, PND, claudication, edema Respiratory: denies cough, dyspnea, pleurisy, hoarseness, wheezing.  Gastrointestinal: + GERD, + diarrhea since choley Denies dysphagia, heartburn, pain, cramps, nausea, vomiting, bloating, diarrhea, constipation, hematemesis, melena, hematochezia, hemorrhoids Genitourinary: Denies dysuria, frequency, urgency, nocturia, hesitancy, discharge, hematuria, flank pain Breast: Denies Breast lumps, nipple discharge, bleeding.  Musculoskeletal: Denies arthralgia, myalgia, stiffness, Jt. Swelling, pain, Skin: Denies pruritis, rash, hives,  acne, eczema, changing in skin lesion Neuro: Denies Weakness, tremor, incoordination, spasms, paresthesia, pain Psychiatric: Denies confusion, memory loss, sensory loss Endocrine: Denies change in weight, skin, hair change, nocturia, and paresthesia, Diabetic Denies Polys, visual blurring, hyper /hypo glycemic episodes.  Heme/Lymph: Denies Excessive bleeding, bruising, enlarged lymph nodes  Physical Exam: Estimated body mass index is 32.94 kg/(m^2) as calculated from the following:   Height as of this encounter: 5\' 6"  (1.676 m).   Weight as of this encounter: 204 lb (92.534 kg). Filed Vitals:   06/24/13 0919  BP: 138/84  Pulse: 88   Temp: 98.5 F (36.9 C)  Resp: 16   General Appearance: Well nourished, in no apparent distress. Eyes: PERRLA, EOMs, conjunctiva no swelling or erythema, normal fundi and vessels. Sinuses: No Frontal/maxillary tenderness ENT/Mouth: Ext aud canals clear, normal light reflex with TMs without erythema, bulging.  Good dentition. No erythema, swelling, or exudate on post pharynx. Tonsils not swollen or erythematous. Hearing normal.  Neck: Supple, thyroid normal. No bruits Respiratory: Respiratory effort normal, BS equal bilaterally without rales, rhonchi, wheezing or stridor. Cardio: RRR without murmurs, rubs or gallops. Brisk peripheral pulses without edema.  Chest: symmetric, with normal excursions and percussion. Breasts: Symmetric, without lumps, nipple discharge, retractions. Abdomen: Soft, +BS. Non tender, no guarding, rebound, hernias, masses, or organomegaly. .  Lymphatics: Non tender without lymphadenopathy.  Genitourinary: defer Musculoskeletal: Full ROM all peripheral extremities,5/5 strength, and normal gait. Skin: Warm, dry without rashes, lesions, ecchymosis.  Neuro: Cranial nerves intact, reflexes  equal bilaterally. Normal muscle tone, no cerebellar symptoms. Sensation intact.  Psych: Awake and oriented X 3, normal affect, Insight and Judgment appropriate.   EKG: PRP,  No ST changes.  Assessment and Plan: Arthritis- controlled  GERD (gastroesophageal reflux disease)- try protonix   Mental disorder- controlled  Hypertension- continue atenolol  Hyperlipidemia- check labs, continue diet and exericise  Allergy- controlled  Herpes simplex type II infection- no out breaks  Obesity- discussed weight loss  Depression/anxiety- controlled  Palpitations- Continue atenolol. EKG is normal, if continues can send to Cardio, valsalva taught. Diarrhea- with other symptoms with chck 24 hour urine 5HIAA, welchol samples given  Discussed med's effects and SE's. Screening labs and tests as  requested with regular follow-up as recommended.   Michele Cooley 9:48 AM

## 2013-06-24 NOTE — Patient Instructions (Addendum)
Try the protonix rather than the prilosec on empty stomach Try the welchol samples- mix with water, let stand 20 mins, stir again then drink daily or 1-2 times a week    Bad carbs also include fruit juice, alcohol, and sweet tea. These are empty calories that do not signal to your brain that you are full.   Please remember the good carbs are still carbs which convert into sugar. So please measure them out no more than 1/2-1 cup of rice, oatmeal, pasta, and beans.  Veggies are however free foods! Pile them on.   I like lean protein at every meal such as chicken, [place], pork chops, cottage cheese, etc. Just do not fry these meats and please center your meal around vegetable, the meats should be a side dish.   No all fruit is created equal. Please see the list below, the fruit at the bottom is higher in sugars than the fruit at the top   Diet for Gastroesophageal Reflux Disease, Adult Reflux (acid reflux) is when acid from your stomach flows up into the esophagus. When acid comes in contact with the esophagus, the acid causes irritation and soreness (inflammation) in the esophagus. When reflux happens often or so severely that it causes damage to the esophagus, it is called gastroesophageal reflux disease (GERD). Nutrition therapy can help ease the discomfort of GERD. FOODS OR DRINKS TO AVOID OR LIMIT  Smoking or chewing tobacco. Nicotine is one of the most potent stimulants to acid production in the gastrointestinal tract.  Caffeinated and decaffeinated coffee and black tea.  Regular or low-calorie carbonated beverages or energy drinks (caffeine-free carbonated beverages are allowed).   Strong spices, such as black pepper, white pepper, red pepper, cayenne, curry powder, and chili powder.  Peppermint or spearmint.  Chocolate.  High-fat foods, including meats and fried foods. Extra added fats including oils, butter, salad dressings, and nuts. Limit these to less than 8 tsp per  day.  Fruits and vegetables if they are not tolerated, such as citrus fruits or tomatoes.  Alcohol.  Any food that seems to aggravate your condition. If you have questions regarding your diet, call your caregiver or a registered dietitian. OTHER THINGS THAT MAY HELP GERD INCLUDE:   Eating your meals slowly, in a relaxed setting.  Eating 5 to 6 small meals per day instead of 3 large meals.  Eliminating food for a period of time if it causes distress.  Not lying down until 3 hours after eating a meal.  Keeping the head of your bed raised 6 to 9 inches (15 to 23 cm) by using a foam wedge or blocks under the legs of the bed. Lying flat may make symptoms worse.  Being physically active. Weight loss may be helpful in reducing reflux in overweight or obese adults.  Wear loose fitting clothing EXAMPLE MEAL PLAN This meal plan is approximately 2,000 calories based on https://www.bernard.org/ meal planning guidelines. Breakfast   cup cooked oatmeal.  1 cup strawberries.  1 cup low-fat milk.  1 oz almonds. Snack  1 cup cucumber slices.  6 oz yogurt (made from low-fat or fat-free milk). Lunch  2 slice whole-wheat bread.  2 oz sliced [place].  2 tsp mayonnaise.  1 cup blueberries.  1 cup snap peas. Snack  6 whole-wheat crackers.  1 oz string cheese. Dinner   cup brown rice.  1 cup mixed veggies.  1 tsp olive oil.  3 oz grilled fish. Document Released: 05/16/2005 Document Revised: 08/08/2011 Document Reviewed:  04/01/2011 [employer] Patient Information 2014 [employer], [place]. 24-Hour Urine Collection HOME CARE  When you get up in the morning on the day you do this test, pee (urinate) in the toilet and flush. Make a note of the time. This will be your start time on the day of collection and the end time on the next morning.  From then on, save all your pee (urine) in the plastic jug that was given to you.  You should stop collecting your pee 24 hours after you  started.  If the plastic jug that is given to you already has liquid in it, that is okay. Do not throw out the liquid or rinse out the jug. Some tests need the liquid to be added to your pee.  Keep your plastic jug cool (in an ice chest or the refrigerator) during the test.  When the 24 hours is over, bring your plastic jug to the clinic lab. Keep the jug cool (in an ice chest) while you are bringing it to the lab. Document Released: 08/12/2008 Document Revised: 08/08/2011 Document Reviewed: 08/12/2008 [facility] Patient Information 2014 [place], [place].

## 2013-06-25 LAB — MICROALBUMIN / CREATININE URINE RATIO
CREATININE, URINE: 190.5 mg/dL
MICROALB UR: 1.87 mg/dL (ref 0.00–1.89)
MICROALB/CREAT RATIO: 9.8 mg/g (ref 0.0–30.0)

## 2013-06-25 LAB — VITAMIN D 25 HYDROXY (VIT D DEFICIENCY, FRACTURES): VIT D 25 HYDROXY: 57 ng/mL (ref 30–89)

## 2013-06-25 LAB — URINALYSIS, ROUTINE W REFLEX MICROSCOPIC
Bilirubin Urine: NEGATIVE
GLUCOSE, UA: NEGATIVE mg/dL
HGB URINE DIPSTICK: NEGATIVE
Ketones, ur: NEGATIVE mg/dL
Leukocytes, UA: NEGATIVE
NITRITE: NEGATIVE
PH: 5 (ref 5.0–8.0)
Protein, ur: NEGATIVE mg/dL
SPECIFIC GRAVITY, URINE: 1.029 (ref 1.005–1.030)
Urobilinogen, UA: 0.2 mg/dL (ref 0.0–1.0)

## 2013-06-25 LAB — INSULIN, FASTING: Insulin fasting, serum: 194 u[IU]/mL — ABNORMAL HIGH (ref 3–28)

## 2013-07-04 ENCOUNTER — Other Ambulatory Visit: Payer: Self-pay | Admitting: Emergency Medicine

## 2013-07-04 MED ORDER — ALPRAZOLAM 0.5 MG PO TABS: ORAL_TABLET | ORAL | Status: DC

## 2013-07-23 ENCOUNTER — Other Ambulatory Visit: Payer: Self-pay | Admitting: Physician Assistant

## 2013-08-21 ENCOUNTER — Encounter: Payer: Self-pay | Admitting: Physician Assistant

## 2013-08-21 ENCOUNTER — Ambulatory Visit (INDEPENDENT_AMBULATORY_CARE_PROVIDER_SITE_OTHER): Payer: 59 | Admitting: Physician Assistant

## 2013-08-21 VITALS — BP 142/90 | HR 88 | Temp 97.7°F | Resp 16 | Wt 198.0 lb

## 2013-08-21 DIAGNOSIS — E669 Obesity, unspecified: Secondary | ICD-10-CM

## 2013-08-21 DIAGNOSIS — R232 Flushing: Secondary | ICD-10-CM

## 2013-08-21 DIAGNOSIS — M26609 Unspecified temporomandibular joint disorder, unspecified side: Secondary | ICD-10-CM

## 2013-08-21 DIAGNOSIS — E785 Hyperlipidemia, unspecified: Secondary | ICD-10-CM

## 2013-08-21 DIAGNOSIS — I10 Essential (primary) hypertension: Secondary | ICD-10-CM

## 2013-08-21 DIAGNOSIS — F411 Generalized anxiety disorder: Secondary | ICD-10-CM

## 2013-08-21 DIAGNOSIS — R197 Diarrhea, unspecified: Secondary | ICD-10-CM

## 2013-08-21 DIAGNOSIS — D649 Anemia, unspecified: Secondary | ICD-10-CM

## 2013-08-21 DIAGNOSIS — Z79899 Other long term (current) drug therapy: Secondary | ICD-10-CM

## 2013-08-21 DIAGNOSIS — M542 Cervicalgia: Secondary | ICD-10-CM

## 2013-08-21 DIAGNOSIS — E559 Vitamin D deficiency, unspecified: Secondary | ICD-10-CM

## 2013-08-21 LAB — CBC WITH DIFFERENTIAL/PLATELET
BASOS PCT: 2 % — AB (ref 0–1)
Basophils Absolute: 0.1 10*3/uL (ref 0.0–0.1)
EOS ABS: 0.1 10*3/uL (ref 0.0–0.7)
EOS PCT: 3 % (ref 0–5)
HCT: 39.9 % (ref 36.0–46.0)
HEMOGLOBIN: 13.7 g/dL (ref 12.0–15.0)
Lymphocytes Relative: 16 % (ref 12–46)
Lymphs Abs: 0.8 10*3/uL (ref 0.7–4.0)
MCH: 32.9 pg (ref 26.0–34.0)
MCHC: 34.3 g/dL (ref 30.0–36.0)
MCV: 95.9 fL (ref 78.0–100.0)
MONOS PCT: 19 % — AB (ref 3–12)
Monocytes Absolute: 0.9 10*3/uL (ref 0.1–1.0)
Neutro Abs: 2.9 10*3/uL (ref 1.7–7.7)
Neutrophils Relative %: 60 % (ref 43–77)
Platelets: 161 10*3/uL (ref 150–400)
RBC: 4.16 MIL/uL (ref 3.87–5.11)
RDW: 15.2 % (ref 11.5–15.5)
WBC: 4.9 10*3/uL (ref 4.0–10.5)

## 2013-08-21 LAB — HEMOGLOBIN A1C
HEMOGLOBIN A1C: 5.3 % (ref <5.7)
Mean Plasma Glucose: 105 mg/dL (ref <117)

## 2013-08-21 MED ORDER — CONJ ESTROG-MEDROXYPROGEST ACE 0.45-1.5 MG PO TABS: 1.0000 | ORAL_TABLET | Freq: Every day | ORAL | Status: DC

## 2013-08-21 MED ORDER — LORAZEPAM 2 MG PO TABS: 2.0000 mg | ORAL_TABLET | Freq: Two times a day (BID) | ORAL | Status: DC | PRN

## 2013-08-21 MED ORDER — DEXAMETHASONE SODIUM PHOSPHATE 100 MG/10ML IJ SOLN
10.0000 mg | Freq: Once | INTRAMUSCULAR | Status: AC
  Administered 2013-08-21: 10 mg via INTRAMUSCULAR

## 2013-08-21 NOTE — Patient Instructions (Addendum)
24-Hour Urine Collection HOME CARE  When you get up in the morning on the day you do this test, pee (urinate) in the toilet and flush. Make a note of the time. This will be your start time on the day of collection and the end time on the next morning.  From then on, save all your pee (urine) in the plastic jug that was given to you.  You should stop collecting your pee 24 hours after you started.  If the plastic jug that is given to you already has liquid in it, that is okay. Do not throw out the liquid or rinse out the jug. Some tests need the liquid to be added to your pee.  Keep your plastic jug cool (in an ice chest or the refrigerator) during the test.  When the 24 hours is over, bring your plastic jug to the clinic lab. Keep the jug cool (in an ice chest) while you are bringing it to the lab. Document Released: 08/12/2008 Document Revised: 08/08/2011 Document Reviewed: 08/12/2008 [facility] Patient Information 2014 [place], [place].  Cut the wellbutrin to 1/2 a pill Stop xanax and can try Lorzaepam 1/4-1/2 during the day and 1/2-1 at night.  Get on low dose 81 mg enteric coated Asprin with food and minimum of 3 days a week.   Books on tape from [employer], be thinking about what makes you happy.

## 2013-08-21 NOTE — Progress Notes (Signed)
HPI 48 y.o. female  presents for 3 month follow up with hypertension, hyperlipidemia, and vitamin D. Her blood pressure has not been controlled at home, today their BP is BP: 142/90 mmHg She does workout, walking and has a fit bit She denies chest pain, shortness of breath, dizziness. She does have palpitations. She is not on cholesterol medication and denies myalgias. Her cholesterol is not at goal. The cholesterol last visit was:   Lab Results  Component Value Date   CHOL 296* 06/24/2013   HDL 115 06/24/2013   LDLCALC 160* 06/24/2013   TRIG 106 06/24/2013   CHOLHDL 2.6 06/24/2013   Last A1C in the office was:  Lab Results  Component Value Date   HGBA1C 5.3 06/24/2013   Patient is on Vitamin D supplement.   She has been having increase stress, has been taking care of both parents then she recently lost both of her parents, lost her job, and moved in with her husband, worked for 3 weeks, had catarcts so she could not drive and the eye doctor won't do surgery for another month. [place] she can't [place] and states she stays at home, states with out her parents she does not know what to do or what she enjoys doing.  She is trembling, has tremor with intention but also while sitting she has trembling. She also states she has been dizzy and falls over. She is only sleeping every 2 hours.  She is having menopausal hot flashes, sweating, it did get better with the activella initially but then it stopped. Happens 5-6 times and can last 30 mins to 3 hours.  We were suppose to get a 24 hour 5HAII but she has not done. She continues to have diarrhea, 4-5 imodium does help it with occ fecal incontinence.   Current Medications:  Current Outpatient Prescriptions on File Prior to Visit  Medication Sig Dispense Refill  . acyclovir (ZOVIRAX) 400 MG tablet Take 400 mg by mouth 2 (two) times daily.       Marland Kitchen ALPRAZolam (XANAX) 0.5 MG tablet TAKE 1 TABLET BY MOUTH THREE TIMES A DAY AS  NEEDED  90 tablet  1  . aspirin 81 MG tablet Take 81 mg by mouth daily.      Marland Kitchen atenolol (TENORMIN) 100 MG tablet Take One tablet twice daily , one in a.m. , and one in p.m.  180 tablet  0  . buPROPion (WELLBUTRIN XL) 300 MG 24 hr tablet Take 300 mg by mouth daily before breakfast.       . butalbital-acetaminophen-caffeine (FIORICET, ESGIC) 50-325-40 MG per tablet Take 1 tablet by mouth 2 (two) times daily as needed. For migraine      . Cyanocobalamin (B-12) 1000 MCG SUBL Place under the tongue.      Marland Kitchen estradiol-norethindrone (ACTIVELLA) 1-0.5 MG per tablet TAKE 1 TABLET BY MOUTH ONCE DAILY  28 tablet  0  . hydroxychloroquine (PLAQUENIL) 200 MG tablet Take 400 mg by mouth daily before breakfast.       . hyoscyamine (LEVSIN, ANASPAZ) 0.125 MG tablet Take 0.125 mg by mouth every 4 (four) hours as needed.      . leflunomide (ARAVA) 10 MG tablet Take 10 mg by mouth daily before breakfast.       . omeprazole (PRILOSEC) 20 MG capsule Take 20 mg by mouth daily as needed.       . pantoprazole (PROTONIX) 40 MG tablet Take 1 tablet (40 mg total) by  mouth daily.  30 tablet  1   No current facility-administered medications on file prior to visit.   Medical History:  Past Medical History  Diagnosis Date  . Neuromuscular disorder   . Leg pain, right   . Anxiety   . Arthritis     rheumatoid arthritis- hands, degeneration of lumbar  spine   . GERD (gastroesophageal reflux disease)   . Mental disorder   . Eye irritation     R eye- redness- from removing contact lense  . Hypertension   . Hyperlipidemia   . Allergy   . Herpes simplex type II infection   . Obesity   . Depression    Allergies:  Allergies  Allergen Reactions  . Codeine Itching     Review of Systems: [X]  = complains of  [ ]  = denies  General: Fatigue ] Fever [ ]  Chills [ ]  Weakness [ ]   Insomnia ] Eyes: Redness [ ]  Blurred vision Arly.Keller ] Diplopia [ ]   ENT: Congestion [ ]  Sinus Pain [ ]  Post Nasal Drip [ ]  Sore Throat [ ]   Earache [ ]   Cardiac: Chest pain/pressure [ ]  SOB [ ]  Orthopnea [ ]   Palpitations [ ]   Paroxysmal nocturnal dyspnea[ ]  Claudication [ ]  Edema [ ]   Pulmonary: Cough [ ]  Wheezing[ ]   SOB [ ]   Snoring ]  GI: Nausea [ ]  Vomiting[ ]  Dysphagia[ ]  Heartburn[ ]  Abdominal pain [ ]  Constipation [ ] ; Diarrhea ]; BRBPR [ ]  Melena[ ]  GU: Hematuria[ ]  Dysuria [ ]  Nocturia[ ]  Urgency [ ]   Hesitancy [ ]  Discharge [ ]  Neuro: Headaches[ ]  Vertigo[ ]  Paresthesias[ ]  Spasm [ ]  Speech changes [ ]  Incoordination [ ]   Ortho: Arthritis [ ]  Joint pain [ ]  Muscle pain [ X] Joint swelling [ ]  Back Pain [ ]  Skin:  Rash [ ]   Pruritis [ ]  Change in skin lesion [ ]   Psych: Depression[X ] Anxiety[X ] Confusion [ ]  Memory loss [ ]   Heme/Lypmh: Bleeding [ ]  Bruising [ ]  Enlarged lymph nodes [ ]   Endocrine: Visual blurring [ ]  Paresthesia [ ]  Polyuria [ ]  Polydypsea [ ]    Heat/cold intolerance [ ]  Hypoglycemia [ ]   Family history- Review and unchanged Social history- Review and unchanged Physical Exam: BP 142/90  Pulse 88  Temp(Src) 97.7 F (36.5 C)  Resp 16  Wt 198 lb (89.812 kg) Wt Readings from Last 3 Encounters:  08/21/13 198 lb (89.812 kg)  06/24/13 204 lb (92.534 kg)  05/02/12 186 lb 15.2 oz (84.8 kg)   General Appearance: Well nourished, in no apparent distress. Eyes: PERRLA, EOMs, conjunctiva no swelling or erythema Sinuses: No Frontal/maxillary tenderness, + TMJ tenderness and very crowded mouth ENT/Mouth: Ext aud canals clear, TMs without erythema, bulging. No erythema, swelling, or exudate on post pharynx.  Tonsils not swollen or erythematous. Hearing normal.  Neck: Supple, thyroid normal.  Respiratory: Respiratory effort normal, BS equal bilaterally without rales, rhonchi, wheezing or stridor.  Cardio: RRR with no MRGs. Brisk peripheral pulses without edema.  Abdomen: Soft, obese, + BS.  Non tender, no guarding, rebound, hernias, masses. Lymphatics: Non tender without lymphadenopathy.   Musculoskeletal: Full ROM, 5/5 strength, normal gait. Pinpoint tenderness left shoulder/trapizeus.  Skin: Warm, dry without rashes, lesions, ecchymosis.  Neuro: Cranial nerves intact. Normal muscle tone, no cerebellar symptoms. Sensation intact.  Psych: Awake and oriented X 3, normal affect, Insight and Judgment appropriate.   Assessment and Plan:  Hypertension: Continue  medication, monitor blood pressure at home.  Continue DASH diet. Cholesterol: Continue diet and exercise. Check cholesterol.  Pre-diabetes-Continue diet and exercise. Check A1C Vitamin D Def- check level and continue medications.  Menopause/flushing- + post menopausal symptoms. She has not had a hysterectomy, we will start her on low dose estrogen and progesterone. Risks of clotting and breast cancer were discussed and she understands. In addition, the cardiovascular,bone, and other benefits were discussed. She does not smoke. No personal history of breast CA or blood clots, she will be be on 81mg  aspirin and she will get mammograms regularly. -will stop activella and switch to prempro 0.45/1.5mg  daily. Check 51HAA 24 hour urine.  Diarrhea- get 24 hour urine and follow up with GI Anxiety/depression- ? If wellbutrin is also causing trembling- will decrease dose to 150XL and stop xanax and switch to lorazepam 2mg  at night and can take 1/4 to 1/2 during the day.  Left shoulder/neck pain-? From neck versus TMJ- pinpoint tenderness, trigger point given.  Sleep apnea/insomnia? - stop xanax and switch to lorazepam. will discuss next visit.   A trigger point injection was performed at the site of maximal tenderness using 1% plain Lidocaine and Dexamethasone 10mg . This was well tolerated, and followed by immediate relief of pain.  OVER 40 minutes of exam, counseling, chart review, referral performed- follow up in 4 weeks.  Continue diet and meds as discussed. Further disposition pending results of labs.  11:48  AM

## 2013-08-22 LAB — VITAMIN D 25 HYDROXY (VIT D DEFICIENCY, FRACTURES): VIT D 25 HYDROXY: 53 ng/mL (ref 30–89)

## 2013-08-22 LAB — HEPATIC FUNCTION PANEL
ALT: 60 U/L — ABNORMAL HIGH (ref 0–35)
AST: 63 U/L — AB (ref 0–37)
Albumin: 4.4 g/dL (ref 3.5–5.2)
Alkaline Phosphatase: 81 U/L (ref 39–117)
Bilirubin, Direct: 0.2 mg/dL (ref 0.0–0.3)
Indirect Bilirubin: 0.7 mg/dL (ref 0.2–1.2)
TOTAL PROTEIN: 7.3 g/dL (ref 6.0–8.3)
Total Bilirubin: 0.9 mg/dL (ref 0.2–1.2)

## 2013-08-22 LAB — IRON AND TIBC
%SAT: 24 % (ref 20–55)
Iron: 87 ug/dL (ref 42–145)
TIBC: 366 ug/dL (ref 250–470)
UIBC: 279 ug/dL (ref 125–400)

## 2013-08-22 LAB — BASIC METABOLIC PANEL WITH GFR
BUN: 14 mg/dL (ref 6–23)
CO2: 20 meq/L (ref 19–32)
CREATININE: 0.81 mg/dL (ref 0.50–1.10)
Calcium: 9.3 mg/dL (ref 8.4–10.5)
Chloride: 102 mEq/L (ref 96–112)
GFR, Est Non African American: 86 mL/min
GLUCOSE: 84 mg/dL (ref 70–99)
Potassium: 3.3 mEq/L — ABNORMAL LOW (ref 3.5–5.3)
Sodium: 135 mEq/L (ref 135–145)

## 2013-08-22 LAB — TSH: TSH: 0.564 u[IU]/mL (ref 0.350–4.500)

## 2013-08-22 LAB — LIPID PANEL
CHOLESTEROL: 276 mg/dL — AB (ref 0–200)
HDL: 111 mg/dL (ref >39)
LDL Cholesterol: 150 mg/dL — ABNORMAL HIGH (ref 0–99)
TRIGLYCERIDES: 77 mg/dL (ref <150)
Total CHOL/HDL Ratio: 2.5 Ratio
VLDL: 15 mg/dL (ref 0–40)

## 2013-08-22 LAB — FERRITIN: Ferritin: 227 ng/mL (ref 10–291)

## 2013-08-22 LAB — VITAMIN B12: Vitamin B-12: 1785 pg/mL — ABNORMAL HIGH (ref 211–911)

## 2013-08-22 LAB — MAGNESIUM: Magnesium: 1.6 mg/dL (ref 1.5–2.5)

## 2013-08-22 LAB — INSULIN, FASTING: INSULIN FASTING, SERUM: 6 u[IU]/mL (ref 3–28)

## 2013-08-22 MED ORDER — POTASSIUM CHLORIDE CRYS ER 20 MEQ PO TBCR: 20.0000 meq | EXTENDED_RELEASE_TABLET | Freq: Two times a day (BID) | ORAL | Status: DC

## 2013-08-22 NOTE — Addendum Note (Signed)
Addended by: Quentin Mulling R on: 08/22/2013 08:20 AM   Modules accepted: Orders

## 2013-09-03 ENCOUNTER — Encounter: Payer: Self-pay | Admitting: Physician Assistant

## 2013-09-04 ENCOUNTER — Ambulatory Visit: Payer: Self-pay | Admitting: Physician Assistant

## 2013-09-06 ENCOUNTER — Other Ambulatory Visit: Payer: Self-pay | Admitting: Internal Medicine

## 2013-09-16 ENCOUNTER — Other Ambulatory Visit: Payer: Self-pay | Admitting: Emergency Medicine

## 2013-09-16 ENCOUNTER — Other Ambulatory Visit: Payer: Self-pay | Admitting: Physician Assistant

## 2013-09-20 ENCOUNTER — Ambulatory Visit: Payer: Self-pay | Admitting: Physician Assistant

## 2013-10-08 ENCOUNTER — Ambulatory Visit: Payer: Self-pay | Admitting: Physician Assistant

## 2013-10-18 ENCOUNTER — Other Ambulatory Visit: Payer: Self-pay | Admitting: Emergency Medicine

## 2013-10-18 MED ORDER — ALPRAZOLAM 0.5 MG PO TABS: ORAL_TABLET | ORAL | Status: DC

## 2013-10-23 ENCOUNTER — Other Ambulatory Visit: Payer: Self-pay | Admitting: Physician Assistant

## 2013-11-06 ENCOUNTER — Ambulatory Visit (INDEPENDENT_AMBULATORY_CARE_PROVIDER_SITE_OTHER): Payer: 59 | Admitting: Physician Assistant

## 2013-11-06 ENCOUNTER — Encounter: Payer: Self-pay | Admitting: Physician Assistant

## 2013-11-06 VITALS — BP 160/102 | HR 84 | Temp 98.5°F | Resp 16 | Wt 204.0 lb

## 2013-11-06 DIAGNOSIS — F411 Generalized anxiety disorder: Secondary | ICD-10-CM

## 2013-11-06 DIAGNOSIS — R7989 Other specified abnormal findings of blood chemistry: Secondary | ICD-10-CM

## 2013-11-06 DIAGNOSIS — R945 Abnormal results of liver function studies: Secondary | ICD-10-CM

## 2013-11-06 DIAGNOSIS — E876 Hypokalemia: Secondary | ICD-10-CM

## 2013-11-06 LAB — CBC WITH DIFFERENTIAL/PLATELET
Basophils Absolute: 0.1 10*3/uL (ref 0.0–0.1)
Basophils Relative: 1 % (ref 0–1)
EOS ABS: 0.1 10*3/uL (ref 0.0–0.7)
Eosinophils Relative: 2 % (ref 0–5)
HCT: 37.9 % (ref 36.0–46.0)
HEMOGLOBIN: 12.8 g/dL (ref 12.0–15.0)
LYMPHS ABS: 0.6 10*3/uL — AB (ref 0.7–4.0)
Lymphocytes Relative: 12 % (ref 12–46)
MCH: 33.6 pg (ref 26.0–34.0)
MCHC: 33.8 g/dL (ref 30.0–36.0)
MCV: 99.5 fL (ref 78.0–100.0)
MONO ABS: 0.6 10*3/uL (ref 0.1–1.0)
MONOS PCT: 11 % (ref 3–12)
Neutro Abs: 3.8 10*3/uL (ref 1.7–7.7)
Neutrophils Relative %: 74 % (ref 43–77)
Platelets: 201 10*3/uL (ref 150–400)
RBC: 3.81 MIL/uL — ABNORMAL LOW (ref 3.87–5.11)
RDW: 15.9 % — ABNORMAL HIGH (ref 11.5–15.5)
WBC: 5.2 10*3/uL (ref 4.0–10.5)

## 2013-11-06 LAB — BASIC METABOLIC PANEL WITH GFR
BUN: 9 mg/dL (ref 6–23)
CHLORIDE: 106 meq/L (ref 96–112)
CO2: 22 meq/L (ref 19–32)
CREATININE: 0.69 mg/dL (ref 0.50–1.10)
Calcium: 8.6 mg/dL (ref 8.4–10.5)
GFR, Est African American: >89 mL/min
GFR, Est Non African American: >89 mL/min
GLUCOSE: 133 mg/dL — AB (ref 70–99)
Potassium: 3.9 mEq/L (ref 3.5–5.3)
Sodium: 140 mEq/L (ref 135–145)

## 2013-11-06 LAB — HEPATIC FUNCTION PANEL
ALK PHOS: 109 U/L (ref 39–117)
ALT: 22 U/L (ref 0–35)
AST: 33 U/L (ref 0–37)
Albumin: 4 g/dL (ref 3.5–5.2)
BILIRUBIN TOTAL: 0.5 mg/dL (ref 0.2–1.2)
Bilirubin, Direct: 0.1 mg/dL (ref 0.0–0.3)
Indirect Bilirubin: 0.4 mg/dL (ref 0.2–1.2)
Total Protein: 6.9 g/dL (ref 6.0–8.3)

## 2013-11-06 LAB — MAGNESIUM: MAGNESIUM: 1.7 mg/dL (ref 1.5–2.5)

## 2013-11-06 LAB — TSH: TSH: 1.055 u[IU]/mL (ref 0.350–4.500)

## 2013-11-06 NOTE — Progress Notes (Signed)
Assessment and Plan:  Hypokalemia- recheck BMP, she never checked her labs and has not added anything.  Elevated liver function test- will check Hepatitis and LFTs Hot flashes- continue bASA, and continue the estrogen.   Continue diet and meds as discussed. Further disposition pending results of labs.  HPI 48 y.o. female here for 1-2 month follow up.  Her blood pressure has been controlled at home, today their BP is BP: 160/102 mmHg She does not workout. She denies chest pain, shortness of breath, dizziness.  She is not on cholesterol medication and denies myalgias. Her cholesterol is not at goal. The cholesterol last visit was:   Lab Results  Component Value Date   CHOL 276* 08/21/2013   HDL 111 08/21/2013   LDLCALC 150* 08/21/2013   TRIG 77 08/21/2013   CHOLHDL 2.5 08/21/2013   Last A1C in the office was:  Lab Results  Component Value Date   HGBA1C 5.3 08/21/2013   Patient is on Vitamin D supplement.   She was tried on lorazepam for sleep rather than xanax but she states that this did not help her. She is not waking up as much, her and her husband are in separate bedrooms due to the snoring and she states she would like to stay on the xanax.  We switched her to the prempro for her hot flashes and they have improved, she still gets them if stressed or really hot weather.  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  Shoulder pain is better.   Current Medications:  Current Outpatient Prescriptions on File Prior to Visit  Medication Sig Dispense Refill  . acyclovir (ZOVIRAX) 400 MG tablet Take 400 mg by mouth 2 (two) times daily.       Marland Kitchen ALPRAZolam (XANAX) 0.5 MG tablet TAKE 1 TABLET BY MOUTH THREE TIMES A DAY AS NEEDED  90 tablet  1  . aspirin 81 MG tablet Take 81 mg by mouth daily.      Marland Kitchen atenolol (TENORMIN) 100 MG tablet Take One tablet twice daily , one in a.m. , and one in p.m.  180 tablet  0  . buPROPion (WELLBUTRIN XL)  300 MG 24 hr tablet TAKE ONE TABLET BY MOUTH EVERY DAY  90 tablet  0  . butalbital-acetaminophen-caffeine (FIORICET, ESGIC) 50-325-40 MG per tablet Take 1 tablet by mouth 2 (two) times daily as needed. For migraine      . Cyanocobalamin (B-12) 1000 MCG SUBL Place under the tongue.      . escitalopram (LEXAPRO) 20 MG tablet Take 20 mg by mouth daily.      Marland Kitchen estrogen, conjugated,-medroxyprogesterone (PREMPRO) 0.45-1.5 MG per tablet Take 1 tablet by mouth daily.  30 tablet  4  . hydroxychloroquine (PLAQUENIL) 200 MG tablet Take 400 mg by mouth daily before breakfast.       . hyoscyamine (LEVSIN, ANASPAZ) 0.125 MG tablet Take 0.125 mg by mouth every 4 (four) hours as needed.      . leflunomide (ARAVA) 10 MG tablet Take 10 mg by mouth daily before breakfast.       . LORazepam (ATIVAN) 2 MG tablet Take 1 tablet (2 mg total) by mouth 2 (two) times daily as needed for anxiety.  60 tablet  0  . MIMVEY 1-0.5 MG per tablet TAKE 1 TABLET BY MOUTH ONCE DAILY  28 tablet  0  . omeprazole (PRILOSEC) 20 MG capsule Take 20 mg by mouth daily as needed.       Marland Kitchen  pantoprazole (PROTONIX) 40 MG tablet TAKE 1 TABLET BY MOUTH ONCE DAILY  30 tablet  1  . potassium chloride SA (K-DUR,KLOR-CON) 20 MEQ tablet Take 1 tablet (20 mEq total) by mouth 2 (two) times daily.  60 tablet  0   No current facility-administered medications on file prior to visit.   Medical History:  Past Medical History  Diagnosis Date  . Neuromuscular disorder   . Leg pain, right   . Anxiety   . Arthritis     rheumatoid arthritis- hands, degeneration of lumbar  spine   . GERD (gastroesophageal reflux disease)   . Mental disorder   . Eye irritation     R eye- redness- from removing contact lense  . Hypertension   . Hyperlipidemia   . Allergy   . Herpes simplex type II infection   . Obesity   . Depression    Allergies:  Allergies  Allergen Reactions  . Codeine Itching     Review of Systems: [X]  = complains of  [ ]  = denies  General:  Fatigue [ ]  Fever [ ]  Chills [ ]  Weakness [ ]   Insomnia [ ]  Eyes: Redness [ ]  Blurred vision [ ]  Diplopia [ ]   ENT: Congestion [ ]  Sinus Pain [ ]  Post Nasal Drip [ ]  Sore Throat [ ]  Earache [ ]   Cardiac: Chest pain/pressure [ ]  SOB [ ]  Orthopnea [ ]   Palpitations [ ]   Paroxysmal nocturnal dyspnea[ ]  Claudication [ ]  Edema [ ]   Pulmonary: Cough [ ]  Wheezing[ ]   SOB [ ]   Snoring [ ]   GI: Nausea [ ]  Vomiting[ ]  Dysphagia[ ]  Heartburn[ ]  Abdominal pain [ ]  Constipation [ ] ; Diarrhea [ ] ; BRBPR [ ]  Melena[ ]  GU: Hematuria[ ]  Dysuria [ ]  Nocturia[ ]  Urgency [ ]   Hesitancy [ ]  Discharge [ ]  Neuro: Headaches[ ]  Vertigo[ ]  Paresthesias[ ]  Spasm [ ]  Speech changes [ ]  Incoordination [ ]   Ortho: Arthritis [ ]  Joint pain [ ]  Muscle pain [ ]  Joint swelling [ ]  Back Pain [ ]  Skin:  Rash [ ]   Pruritis [ ]  Change in skin lesion [ ]   Psych: Depression[ ]  Anxiety[ ]  Confusion [ ]  Memory loss [ ]   Heme/Lypmh: Bleeding [ ]  Bruising [ ]  Enlarged lymph nodes [ ]   Endocrine: Visual blurring [ ]  Paresthesia [ ]  Polyuria [ ]  Polydypsea [ ]    Heat/cold intolerance [ ]  Hypoglycemia [ ]   Family history- Review and unchanged Social history- Review and unchanged Physical Exam: BP 160/102  Pulse 84  Temp(Src) 98.5 F (36.9 C)  Resp 16  Wt 204 lb (92.534 kg) Wt Readings from Last 3 Encounters:  11/06/13 204 lb (92.534 kg)  08/21/13 198 lb (89.812 kg)  06/24/13 204 lb (92.534 kg)   General Appearance: Well nourished, in no apparent distress. Eyes: PERRLA, EOMs, conjunctiva no swelling or erythema Sinuses: No Frontal/maxillary tenderness ENT/Mouth: Ext aud canals clear, TMs without erythema, bulging. No erythema, swelling, or exudate on post pharynx.  Tonsils not swollen or erythematous. Hearing normal.  Neck: Supple, thyroid normal.  Respiratory: Respiratory effort normal, BS equal bilaterally without rales, rhonchi, wheezing or stridor.  Cardio: RRR with no MRGs. Brisk peripheral pulses without edema.  Abdomen:  Soft, + BS.  Non tender, no guarding, rebound, hernias, masses. Lymphatics: Non tender without lymphadenopathy.  Musculoskeletal: Full ROM, 5/5 strength, normal gait.  Skin: Warm, dry without rashes, lesions, ecchymosis.  Neuro: Cranial nerves intact. Normal muscle tone, no cerebellar symptoms. Sensation  intact.  Psych: Awake and oriented X 3, normal affect, Insight and Judgment appropriate.    Quentin Mulling 2:06 PM

## 2013-11-06 NOTE — Patient Instructions (Signed)
Magnesium low add 250 mg with food to prevent diarrhea. Magnesium may help with muscle cramps, constipation, vitamin D and potassium absorption.   Hypokalemia Hypokalemia means that the amount of potassium in the blood is lower than normal.Potassium is a chemical, called an electrolyte, that helps regulate the amount of fluid in the body. It also stimulates muscle contraction and helps nerves function properly.Most of the body's potassium is inside of cells, and only a very small amount is in the blood. Because the amount in the blood is so small, minor changes can be life-threatening. CAUSES  Antibiotics.  Diarrhea or vomiting.  Using laxatives too much, which can cause diarrhea.  Chronic kidney disease.  Water pills (diuretics).  Eating disorders (bulimia).  Low magnesium level.  Sweating a lot. SIGNS AND SYMPTOMS  Weakness.  Constipation.  Fatigue.  Muscle cramps.  Mental confusion.  Skipped heartbeats or irregular heartbeat (palpitations).  Tingling or numbness. DIAGNOSIS  Your health care provider can diagnose hypokalemia with blood tests. In addition to checking your potassium level, your health care provider may also check other lab tests. TREATMENT Hypokalemia can be treated with potassium supplements taken by mouth or adjustments in your current medicines. If your potassium level is very low, you may need to get potassium through a vein (IV) and be monitored in the hospital. A diet high in potassium is also helpful. Foods high in potassium are:  Nuts, such as peanuts and pistachios.  Seeds, such as sunflower seeds and pumpkin seeds.  Peas, lentils, and lima beans.  Whole grain and bran cereals and breads.  Fresh fruit and vegetables, such as apricots, avocado, bananas, cantaloupe, kiwi, oranges, tomatoes, asparagus, and potatoes.  Orange and tomato juices.  Red meats.  Fruit yogurt. HOME CARE INSTRUCTIONS  Take all medicines as prescribed by your  health care provider.  Maintain a healthy diet by including nutritious food, such as fruits, vegetables, nuts, whole grains, and lean meats.  If you are taking a laxative, be sure to follow the directions on the label. SEEK MEDICAL CARE IF:  Your weakness gets worse.  You feel your heart pounding or racing.  You are vomiting or having diarrhea.  You are diabetic and having trouble keeping your blood glucose in the normal range. SEEK IMMEDIATE MEDICAL CARE IF:  You have chest pain, shortness of breath, or dizziness.  You are vomiting or having diarrhea for more than 2 days.  You faint. MAKE SURE YOU:   Understand these instructions.  Will watch your condition.  Will get help right away if you are not doing well or get worse. Document Released: 05/16/2005 Document Revised: 03/06/2013 Document Reviewed: 11/16/2012 [facility] Patient Information 2014 [place], [place].  Potassium Content of Foods  The body needs potassium to control blood pressure and to keep the muscles and nervous system healthy. Here are some healthy foods below that are high in potassium. Also you can get the white lab of "NO SALT" salt substitute, 1/4 teaspoon of this is equivalent to potassium.   FOODS AND DRINKS HIGH IN POTASSIUM FOODS MODERATE IN POTASSIUM   Fruits  Avocado (cubed),  c / 50 g.  Banana (sliced), 75 g.  Cantaloupe (cubed), 80 g.  Honeydew, 1 wedge / 85 g.  Kiwi (sliced), 90 g.  Nectarine, 1 small / 129 g.  Orange, 1 medium / 131 g. Vegetables  Artichoke,  of a medium / 64 g.  Asparagus (boiled), 90 g..  Broccoli (boiled), 78 g.  Brussels sprout (boiled), 78  g.  Butternut squash (baked), 103 g.  Chickpea (cooked), 82 g.  Green peas (cooked), 80 g.  Kidney beans (cooked), 5 tbsp / 55 g.  Lima beans (cooked),  c / 43 g.  Navy beans (cooked),  c / 61 g.  Spinach (cooked),  c / 45 g.  Sweet potato (baked),  c / 50 g.  Tomato (chopped or sliced), 90  g.  Vegetable juice.  White mushrooms (cooked), 78 g.  Yam (cooked or baked),  c / 34 g.  Zucchini squash (boiled), 90 g. Other Foods and Drinks  Almonds (whole),  c / 36 g.  Fish, 3 oz / 85 g.  Nonfat fruit variety yogurt, 123 g.  Pistachio nuts, 1 oz / 28 g.  Pumpkin seeds, 1 oz / 28 g.  Red meat (broiled, cooked, grilled), 3 oz / 85 g.  Scallops (steamed), 3 oz / 85 g.  Spaghetti sauce,  c / 66 g.  Sunflower seeds (dry roasted), 1 oz / 28 g.  Veggie burger, 1 patty / 70 g. Fruits  Grapefruit,  of the fruit / 123 g  Plums (sliced), 83 g.  Tangerine, 1 large / 120 g. Vegetables  Carrots (boiled), 78 g.  Carrots (sliced), 61 g.  Rhubarb (cooked with sugar), 120 g.  Rutabaga (cooked), 120 g.  Yellow snap beans (cooked), 63 g. Other Foods and Drinks   Chicken breast (roasted and chopped),  c / 70 g.  Pita bread, 1 large / 64 g.  Shrimp (steamed), 4 oz / 113 g.  Swiss cheese (diced), 70 g.

## 2013-11-07 LAB — HEPATITIS A ANTIBODY, TOTAL: Hep A Total Ab: NONREACTIVE

## 2013-11-07 LAB — HEPATITIS C ANTIBODY: HCV AB: NEGATIVE

## 2013-11-07 LAB — HEPATITIS B CORE ANTIBODY, TOTAL: Hep B Core Total Ab: NONREACTIVE

## 2013-11-07 LAB — HEPATITIS B SURFACE ANTIBODY,QUALITATIVE: HEP B S AB: NEGATIVE

## 2013-11-08 LAB — HEPATITIS B E ANTIBODY: Hepatitis Be Antibody: NONREACTIVE

## 2013-11-28 ENCOUNTER — Other Ambulatory Visit: Payer: Self-pay | Admitting: Physician Assistant

## 2013-12-10 ENCOUNTER — Other Ambulatory Visit: Payer: Self-pay | Admitting: Physician Assistant

## 2013-12-25 ENCOUNTER — Other Ambulatory Visit: Payer: Self-pay | Admitting: Internal Medicine

## 2014-01-08 ENCOUNTER — Ambulatory Visit: Payer: Self-pay | Admitting: Physician Assistant

## 2014-02-18 ENCOUNTER — Other Ambulatory Visit: Payer: Self-pay | Admitting: Physician Assistant

## 2014-02-18 ENCOUNTER — Ambulatory Visit: Payer: Self-pay | Admitting: Physician Assistant

## 2014-02-18 MED ORDER — ALPRAZOLAM 0.5 MG PO TABS: ORAL_TABLET | ORAL | Status: DC

## 2014-03-14 ENCOUNTER — Other Ambulatory Visit: Payer: Self-pay

## 2014-03-18 ENCOUNTER — Other Ambulatory Visit: Payer: Self-pay | Admitting: *Deleted

## 2014-03-18 MED ORDER — ACYCLOVIR 400 MG PO TABS
400.0000 mg | ORAL_TABLET | Freq: Two times a day (BID) | ORAL | Status: DC
Start: 2014-03-18 — End: 2015-05-16

## 2014-04-01 ENCOUNTER — Ambulatory Visit: Payer: Self-pay | Admitting: Physician Assistant

## 2014-04-10 ENCOUNTER — Other Ambulatory Visit: Payer: Self-pay | Admitting: *Deleted

## 2014-04-10 MED ORDER — ATENOLOL 100 MG PO TABS: ORAL_TABLET | ORAL | Status: DC

## 2014-04-10 MED ORDER — BUPROPION HCL ER (XL) 300 MG PO TB24: ORAL_TABLET | ORAL | Status: DC

## 2014-04-10 MED ORDER — ALPRAZOLAM 0.5 MG PO TABS: ORAL_TABLET | ORAL | Status: DC

## 2014-04-21 ENCOUNTER — Ambulatory Visit: Payer: Self-pay | Admitting: Physician Assistant

## 2014-04-26 ENCOUNTER — Other Ambulatory Visit: Payer: Self-pay | Admitting: Physician Assistant

## 2014-05-07 ENCOUNTER — Ambulatory Visit: Payer: Self-pay | Admitting: Physician Assistant

## 2014-05-12 ENCOUNTER — Ambulatory Visit (INDEPENDENT_AMBULATORY_CARE_PROVIDER_SITE_OTHER): Payer: Managed Care, Other (non HMO) | Admitting: Physician Assistant

## 2014-05-12 ENCOUNTER — Encounter: Payer: Self-pay | Admitting: Physician Assistant

## 2014-05-12 VITALS — BP 148/102 | HR 84 | Temp 97.7°F | Resp 16 | Wt 194.0 lb

## 2014-05-12 DIAGNOSIS — N951 Menopausal and female climacteric states: Secondary | ICD-10-CM

## 2014-05-12 DIAGNOSIS — I10 Essential (primary) hypertension: Secondary | ICD-10-CM

## 2014-05-12 DIAGNOSIS — K2100 Gastro-esophageal reflux disease with esophagitis, without bleeding: Secondary | ICD-10-CM

## 2014-05-12 DIAGNOSIS — E785 Hyperlipidemia, unspecified: Secondary | ICD-10-CM

## 2014-05-12 DIAGNOSIS — R232 Flushing: Secondary | ICD-10-CM

## 2014-05-12 DIAGNOSIS — E669 Obesity, unspecified: Secondary | ICD-10-CM

## 2014-05-12 DIAGNOSIS — E559 Vitamin D deficiency, unspecified: Secondary | ICD-10-CM

## 2014-05-12 DIAGNOSIS — R0683 Snoring: Secondary | ICD-10-CM

## 2014-05-12 DIAGNOSIS — Z79899 Other long term (current) drug therapy: Secondary | ICD-10-CM

## 2014-05-12 MED ORDER — CITALOPRAM HYDROBROMIDE 40 MG PO TABS: 40.0000 mg | ORAL_TABLET | Freq: Every day | ORAL | Status: DC

## 2014-05-12 MED ORDER — LOSARTAN POTASSIUM 100 MG PO TABS: 100.0000 mg | ORAL_TABLET | Freq: Every day | ORAL | Status: DC

## 2014-05-12 NOTE — Progress Notes (Signed)
Assessment and Plan:  Hypertension: Continue medication, add cozaar 100mg  start 1/2 daily increase to 1 pill if BP over 140/90 still, monitor blood pressure at home. DASH diet started/given.  Reminder to go to the ER if any CP, SOB, nausea, dizziness, severe HA, changes vision/speech, left arm numbness and tingling, and jaw pain. Cholesterol: Continue diet and exercise. Check cholesterol, declines medication Vitamin D Def- check level and continue medications.  Snoring-? Sleep apnea complicating the BP Hot flashes/anxiety- add celexa 40mg  QD, cut wellbutrin in 1/2 for 2 weeks and then stop.  Obesity with co morbidities- long discussion about weight loss, diet, and exercise  Follow up 4-8 weeks recheck kidney function, monitor medications.  Continue diet and meds as discussed. Further disposition pending results of labs.  HPI 48 y.o. female  presents for follow up, with hypertension, hyperlipidemia,  and vitamin D. She changed PCP to [facility] because her and her husband moved but states that she did not like them and is here to reestablish care. Last seen in June of this year.  Her blood pressure has not been controlled at home, she is on atenolol 100mg  BID and has added HCTZ 12.5 1/2 pill , she is not on a potassium supplement, today their BP is BP: (!) 148/102 mmHg She does not workout. She denies chest pain, shortness of breath, dizziness.  She is not on cholesterol medication and denies myalgias. Her cholesterol is not at goal. The cholesterol last visit was:   Lab Results  Component Value Date   CHOL 276* 08/21/2013   HDL 111 08/21/2013   LDLCALC 150* 08/21/2013   TRIG 77 08/21/2013   CHOLHDL 2.5 08/21/2013   . Last A1C in the office was and at [facility] was 5.1. Lab Results  Component Value Date   HGBA1C 5.3 08/21/2013   TSH at [facility] was 0.941.  Patient is on Vitamin D supplement.   Lab Results  Component Value Date   VD25OH 43 08/21/2013     She takes xanax for anxiety/sleep,  admits to snoring and frequent awakening but thinks she does not have sleep apnea and does not want a sleep study test, willing to do apnea link.  She has hot flashes due to menopause and is on prempro which helps. She is on a bASA.  She is off her lexapro and wellbutrin, she started a new job that has increased stress.  She has GERD on protonix.    Current Medications:  Current Outpatient Prescriptions on File Prior to Visit  Medication Sig Dispense Refill  . acyclovir (ZOVIRAX) 400 MG tablet Take 1 tablet (400 mg total) by mouth 2 (two) times daily. 60 tablet 11  . ALPRAZolam (XANAX) 0.5 MG tablet TAKE 1 TABLET BY MOUTH THREE TIMES A DAY AS NEEDED 90 tablet 0  . aspirin 81 MG tablet Take 81 mg by mouth daily.    [facility] atenolol (TENORMIN) 100 MG tablet TAKE 1 TABLET BY MOUTH TWICE DAILY 180 tablet 0  . buPROPion (WELLBUTRIN XL) 300 MG 24 hr tablet TAKE ONE TABLET BY MOUTH EVERY DAY 90 tablet 0  . butalbital-acetaminophen-caffeine (FIORICET, ESGIC) 50-325-40 MG per tablet Take 1 tablet by mouth 2 (two) times daily as needed. For migraine    . Cyanocobalamin (B-12) 1000 MCG SUBL Place under the tongue.    . escitalopram (LEXAPRO) 20 MG tablet Take 20 mg by mouth daily.    40 estrogen, conjugated,-medroxyprogesterone (PREMPRO) 0.45-1.5 MG per tablet Take 1 tablet by mouth daily. 30 tablet 4  .  hydroxychloroquine (PLAQUENIL) 200 MG tablet Take 400 mg by mouth daily before breakfast.     . hyoscyamine (LEVSIN, ANASPAZ) 0.125 MG tablet Take 0.125 mg by mouth every 4 (four) hours as needed.    . leflunomide (ARAVA) 10 MG tablet Take 10 mg by mouth daily before breakfast.     . MIMVEY 1-0.5 MG per tablet TAKE 1 TABLET BY MOUTH ONCE DAILY 28 tablet 3  . pantoprazole (PROTONIX) 40 MG tablet TAKE 1 TABLET BY MOUTH EVERY DAY 30 tablet 0  . potassium chloride SA (K-DUR,KLOR-CON) 20 MEQ tablet Take 1 tablet (20 mEq total) by mouth 2 (two) times daily. 60 tablet 0   No current facility-administered medications  on file prior to visit.   Medical History:  Past Medical History  Diagnosis Date  . Neuromuscular disorder   . Leg pain, right   . Anxiety   . Arthritis     rheumatoid arthritis- hands, degeneration of lumbar  spine   . GERD (gastroesophageal reflux disease)   . Mental disorder   . Eye irritation     R eye- redness- from removing contact lense  . Hypertension   . Hyperlipidemia   . Allergy   . Herpes simplex type II infection   . Obesity   . Depression    Allergies:  Allergies  Allergen Reactions  . Codeine Itching     Review of Systems:  Review of Systems  Constitutional: Positive for malaise/fatigue and diaphoresis (from hot flashes). Negative for fever, chills and weight loss.  HENT: Negative.   Eyes: Negative.   Respiratory: Negative.   Cardiovascular: Negative.   Gastrointestinal: Negative.   Genitourinary: Negative.   Musculoskeletal: Positive for myalgias and joint pain (from RA). Negative for back pain, falls and neck pain.  Skin: Negative.   Neurological: Negative.  Negative for weakness.  Endo/Heme/Allergies: Negative.   Psychiatric/Behavioral: Negative for depression, suicidal ideas, hallucinations, memory loss and substance abuse. The patient is nervous/anxious and has insomnia.     Family history- Review and unchanged Social history- Review and unchanged Physical Exam: BP 148/102 mmHg  Pulse 84  Temp(Src) 97.7 F (36.5 C)  Resp 16  Wt 194 lb (87.998 kg) Wt Readings from Last 3 Encounters:  05/12/14 194 lb (87.998 kg)  11/06/13 204 lb (92.534 kg)  08/21/13 198 lb (89.812 kg)   General Appearance: Well nourished, sweating/flushed, in no apparent distress. Eyes: PERRLA, EOMs, conjunctiva no swelling or erythema Sinuses: No Frontal/maxillary tenderness ENT/Mouth: Ext aud canals clear, TMs without erythema, bulging. No erythema, swelling, or exudate on post pharynx.  Tonsils not swollen or erythematous. Hearing normal.  Neck: Supple, thyroid  normal.  Respiratory: Respiratory effort normal, BS equal bilaterally without rales, rhonchi, wheezing or stridor.  Cardio: RRR with no MRGs. Brisk peripheral pulses without edema.  Abdomen: Soft, + BS, obese  Non tender, no guarding, rebound, hernias, masses. Lymphatics: Non tender without lymphadenopathy.  Musculoskeletal: Full ROM, 5/5 strength, normal gait.  Skin: Warm, dry without rashes, lesions, ecchymosis.  Neuro: Cranial nerves intact. Normal muscle tone, no cerebellar symptoms. Sensation intact.  Psych: Awake and oriented X 3, normal affect, Insight and Judgment appropriate.    Quentin Mulling, PA-C 11:00 AM [facility]

## 2014-05-12 NOTE — Patient Instructions (Addendum)
Cut the wellbutrin in 1/2 and take one daily for 2 weeks and then stop.   Try celexa 40mg  1/2 pill for 2 weeks can increase to 1 pill for the hot flashes.  If this does not work will try 10mg  of paxil for 1 month If this does not work we can try Gabapentin 300mg  at night for 2 weeks and then 300mg  night and 300mg  at lunch and can do three times a day.   Continue the fluid pill for now and will add on Losartan 100mg  start on 1/2 pill daily and monitor BP, if BP is still 140/90 can increase to 1 pill. If this combination works we will add the fluid pill and the cozaar together.  Follow up 1 month     Bad carbs also include fruit juice, alcohol, and sweet tea. These are empty calories that do not signal to your brain that you are full.   Please remember the good carbs are still carbs which convert into sugar. So please measure them out no more than 1/2-1 cup of rice, oatmeal, pasta, and beans  Veggies are however free foods! Pile them on.   Not all fruit is created equal. Please see the list below, the fruit at the bottom is higher in sugars than the fruit at the top. Please avoid all dried fruits.     DASH Eating Plan DASH stands for "Dietary Approaches to Stop Hypertension." The DASH eating plan is a healthy eating plan that has been shown to reduce high blood pressure (hypertension). Additional health benefits may include reducing the risk of type 2 diabetes mellitus, heart disease, and stroke. The DASH eating plan may also help with weight loss. WHAT DO I NEED TO KNOW ABOUT THE DASH EATING PLAN? For the DASH eating plan, you will follow these general guidelines:  Choose foods with a percent daily value for sodium of less than 5% (as listed on the food label).  Use salt-free seasonings or herbs instead of table salt or sea salt.  Check with your health care provider or pharmacist before using salt substitutes.  Eat lower-sodium products, often labeled as "lower sodium" or "no salt  added."  Eat fresh foods.  Eat more vegetables, fruits, and low-fat dairy products.  Choose whole grains. Look for the word "whole" as the first word in the ingredient list.  Choose fish and skinless chicken or more often than red meat. Limit fish, poultry, and meat to 6 oz (170 g) each day.  Limit sweets, desserts, sugars, and sugary drinks.  Choose heart-healthy fats.  Limit cheese to 1 oz (28 g) per day.  Eat more home-cooked food and less restaurant, buffet, and fast food.  Limit fried foods.  Cook foods using methods other than frying.  Limit canned vegetables. If you do use them, rinse them well to decrease the sodium.  When eating at a restaurant, ask that your food be prepared with less salt, or no salt if possible. WHAT FOODS CAN I EAT? Seek help from a dietitian for individual calorie needs. Grains Whole grain or whole wheat bread. Brown rice. Whole grain or whole wheat pasta. Quinoa, bulgur, and whole grain cereals. Low-sodium cereals. Corn or whole wheat flour tortillas. Whole grain cornbread. Whole grain crackers. Low-sodium crackers. Vegetables Fresh or frozen vegetables (raw, steamed, roasted, or grilled). Low-sodium or reduced-sodium tomato and vegetable juices. Low-sodium or reduced-sodium tomato sauce and paste. Low-sodium or reduced-sodium canned vegetables.  Fruits All fresh, canned (in natural juice), or frozen  fruits. Meat and Other Protein Products Ground beef (85% or leaner), grass-fed beef, or beef trimmed of fat. Skinless chicken or [place]. Ground chicken or [place]. Pork trimmed of fat. All fish and seafood. Eggs. Dried beans, peas, or lentils. Unsalted nuts and seeds. Unsalted canned beans. Dairy Low-fat dairy products, such as skim or 1% milk, 2% or reduced-fat cheeses, low-fat ricotta or cottage cheese, or plain low-fat yogurt. Low-sodium or reduced-sodium cheeses. Fats and Oils Tub margarines without trans fats. Light or reduced-fat  mayonnaise and salad dressings (reduced sodium). Avocado. Safflower, olive, or canola oils. Natural peanut or almond butter. Other Unsalted popcorn and pretzels. The items listed above may not be a complete list of recommended foods or beverages. Contact your dietitian for more options. WHAT FOODS ARE NOT RECOMMENDED? Grains White bread. White pasta. White rice. Refined cornbread. Bagels and croissants. Crackers that contain trans fat. Vegetables Creamed or fried vegetables. Vegetables in a cheese sauce. Regular canned vegetables. Regular canned tomato sauce and paste. Regular tomato and vegetable juices. Fruits Dried fruits. Canned fruit in light or heavy syrup. Fruit juice. Meat and Other Protein Products Fatty cuts of meat. Ribs, chicken wings, bacon, sausage, bologna, salami, chitterlings, fatback, hot dogs, bratwurst, and packaged luncheon meats. Salted nuts and seeds. Canned beans with salt. Dairy Whole or 2% milk, cream, half-and-half, and cream cheese. Whole-fat or sweetened yogurt. Full-fat cheeses or blue cheese. Nondairy creamers and whipped toppings. Processed cheese, cheese spreads, or cheese curds. Condiments Onion and garlic salt, seasoned salt, table salt, and sea salt. Canned and packaged gravies. Worcestershire sauce. Tartar sauce. Barbecue sauce. Teriyaki sauce. Soy sauce, including reduced sodium. Steak sauce. Fish sauce. Oyster sauce. Cocktail sauce. Horseradish. Ketchup and mustard. Meat flavorings and tenderizers. Bouillon cubes. Hot sauce. Tabasco sauce. Marinades. Taco seasonings. Relishes. Fats and Oils Butter, stick margarine, lard, shortening, ghee, and bacon fat. Coconut, palm kernel, or palm oils. Regular salad dressings. Other Pickles and olives. Salted popcorn and pretzels. The items listed above may not be a complete list of foods and beverages to avoid. Contact your dietitian for more information. WHERE CAN I FIND MORE INFORMATION? National Heart, Lung, and  Blood Institute: [email or web address] Document Released: 05/05/2011 Document Revised: 09/30/2013 Document Reviewed: 03/20/2013 [facility] Patient Information 2015 [place], [place]. This information is not intended to replace advice given to you by your health care provider. Make sure you discuss any questions you have with your health care provider.

## 2014-05-13 LAB — BASIC METABOLIC PANEL WITH GFR
BUN: 9 mg/dL (ref 6–23)
CHLORIDE: 97 meq/L (ref 96–112)
CO2: 24 meq/L (ref 19–32)
Calcium: 9.9 mg/dL (ref 8.4–10.5)
Creat: 0.71 mg/dL (ref 0.50–1.10)
GFR, Est Non African American: >89 mL/min
Glucose, Bld: 87 mg/dL (ref 70–99)
POTASSIUM: 4 meq/L (ref 3.5–5.3)
Sodium: 134 mEq/L — ABNORMAL LOW (ref 135–145)

## 2014-05-13 LAB — CBC WITH DIFFERENTIAL/PLATELET
Basophils Absolute: 0.1 10*3/uL (ref 0.0–0.1)
Basophils Relative: 3 % — ABNORMAL HIGH (ref 0–1)
Eosinophils Absolute: 0.1 10*3/uL (ref 0.0–0.7)
Eosinophils Relative: 2 % (ref 0–5)
HEMATOCRIT: 39.3 % (ref 36.0–46.0)
Hemoglobin: 13.6 g/dL (ref 12.0–15.0)
LYMPHS PCT: 22 % (ref 12–46)
Lymphs Abs: 0.9 10*3/uL (ref 0.7–4.0)
MCH: 33.5 pg (ref 26.0–34.0)
MCHC: 34.6 g/dL (ref 30.0–36.0)
MCV: 96.8 fL (ref 78.0–100.0)
MONO ABS: 0.4 10*3/uL (ref 0.1–1.0)
MONOS PCT: 9 % (ref 3–12)
MPV: 10.1 fL (ref 9.4–12.4)
NEUTROS PCT: 64 % (ref 43–77)
Neutro Abs: 2.8 10*3/uL (ref 1.7–7.7)
Platelets: 347 10*3/uL (ref 150–400)
RBC: 4.06 MIL/uL (ref 3.87–5.11)
RDW: 14.2 % (ref 11.5–15.5)
WBC: 4.3 10*3/uL (ref 4.0–10.5)

## 2014-05-13 LAB — LIPID PANEL
Cholesterol: 253 mg/dL — ABNORMAL HIGH (ref 0–200)
HDL: 84 mg/dL (ref >39)
LDL Cholesterol: 133 mg/dL — ABNORMAL HIGH (ref 0–99)
TRIGLYCERIDES: 179 mg/dL — AB (ref <150)
Total CHOL/HDL Ratio: 3 Ratio
VLDL: 36 mg/dL (ref 0–40)

## 2014-05-13 LAB — HEPATIC FUNCTION PANEL
ALK PHOS: 126 U/L — AB (ref 39–117)
ALT: 31 U/L (ref 0–35)
AST: 40 U/L — AB (ref 0–37)
Albumin: 4.2 g/dL (ref 3.5–5.2)
BILIRUBIN DIRECT: 0.1 mg/dL (ref 0.0–0.3)
BILIRUBIN INDIRECT: 0.3 mg/dL (ref 0.2–1.2)
Total Bilirubin: 0.4 mg/dL (ref 0.2–1.2)
Total Protein: 7.6 g/dL (ref 6.0–8.3)

## 2014-05-13 LAB — MAGNESIUM: Magnesium: 1.5 mg/dL (ref 1.5–2.5)

## 2014-05-13 LAB — VITAMIN D 25 HYDROXY (VIT D DEFICIENCY, FRACTURES): VIT D 25 HYDROXY: 35 ng/mL (ref 30–100)

## 2014-05-25 ENCOUNTER — Other Ambulatory Visit: Payer: Self-pay | Admitting: Internal Medicine

## 2014-05-25 DIAGNOSIS — F419 Anxiety disorder, unspecified: Secondary | ICD-10-CM

## 2014-05-25 DIAGNOSIS — K219 Gastro-esophageal reflux disease without esophagitis: Secondary | ICD-10-CM

## 2014-06-09 ENCOUNTER — Other Ambulatory Visit: Payer: Self-pay | Admitting: Physician Assistant

## 2014-06-23 ENCOUNTER — Other Ambulatory Visit: Payer: Self-pay | Admitting: Physician Assistant

## 2014-06-25 ENCOUNTER — Ambulatory Visit (INDEPENDENT_AMBULATORY_CARE_PROVIDER_SITE_OTHER): Payer: Managed Care, Other (non HMO) | Admitting: Physician Assistant

## 2014-06-25 ENCOUNTER — Encounter: Payer: Self-pay | Admitting: Physician Assistant

## 2014-06-25 ENCOUNTER — Encounter: Payer: Self-pay | Admitting: Internal Medicine

## 2014-06-25 VITALS — BP 128/78 | HR 84 | Temp 97.9°F | Resp 16 | Ht 66.0 in | Wt 197.0 lb

## 2014-06-25 DIAGNOSIS — Z0001 Encounter for general adult medical examination with abnormal findings: Secondary | ICD-10-CM

## 2014-06-25 DIAGNOSIS — N951 Menopausal and female climacteric states: Secondary | ICD-10-CM

## 2014-06-25 DIAGNOSIS — Z91199 Patient's noncompliance with other medical treatment and regimen due to unspecified reason: Secondary | ICD-10-CM

## 2014-06-25 DIAGNOSIS — E559 Vitamin D deficiency, unspecified: Secondary | ICD-10-CM

## 2014-06-25 DIAGNOSIS — E785 Hyperlipidemia, unspecified: Secondary | ICD-10-CM

## 2014-06-25 DIAGNOSIS — M069 Rheumatoid arthritis, unspecified: Secondary | ICD-10-CM

## 2014-06-25 DIAGNOSIS — Z9119 Patient's noncompliance with other medical treatment and regimen: Secondary | ICD-10-CM

## 2014-06-25 DIAGNOSIS — R945 Abnormal results of liver function studies: Secondary | ICD-10-CM

## 2014-06-25 DIAGNOSIS — K2100 Gastro-esophageal reflux disease with esophagitis, without bleeding: Secondary | ICD-10-CM

## 2014-06-25 DIAGNOSIS — Z1211 Encounter for screening for malignant neoplasm of colon: Secondary | ICD-10-CM

## 2014-06-25 DIAGNOSIS — R0982 Postnasal drip: Secondary | ICD-10-CM

## 2014-06-25 DIAGNOSIS — B009 Herpesviral infection, unspecified: Secondary | ICD-10-CM

## 2014-06-25 DIAGNOSIS — F329 Major depressive disorder, single episode, unspecified: Secondary | ICD-10-CM

## 2014-06-25 DIAGNOSIS — I10 Essential (primary) hypertension: Secondary | ICD-10-CM

## 2014-06-25 DIAGNOSIS — E538 Deficiency of other specified B group vitamins: Secondary | ICD-10-CM | POA: Insufficient documentation

## 2014-06-25 DIAGNOSIS — F32A Depression, unspecified: Secondary | ICD-10-CM | POA: Insufficient documentation

## 2014-06-25 DIAGNOSIS — R232 Flushing: Secondary | ICD-10-CM | POA: Insufficient documentation

## 2014-06-25 DIAGNOSIS — R350 Frequency of micturition: Secondary | ICD-10-CM

## 2014-06-25 DIAGNOSIS — E669 Obesity, unspecified: Secondary | ICD-10-CM

## 2014-06-25 DIAGNOSIS — R7989 Other specified abnormal findings of blood chemistry: Secondary | ICD-10-CM

## 2014-06-25 DIAGNOSIS — R6889 Other general symptoms and signs: Secondary | ICD-10-CM

## 2014-06-25 DIAGNOSIS — F419 Anxiety disorder, unspecified: Secondary | ICD-10-CM

## 2014-06-25 DIAGNOSIS — Z79899 Other long term (current) drug therapy: Secondary | ICD-10-CM | POA: Insufficient documentation

## 2014-06-25 LAB — CBC WITH DIFFERENTIAL/PLATELET
BASOS ABS: 0.1 10*3/uL (ref 0.0–0.1)
Basophils Relative: 2 % — ABNORMAL HIGH (ref 0–1)
Eosinophils Absolute: 0.1 10*3/uL (ref 0.0–0.7)
Eosinophils Relative: 3 % (ref 0–5)
HEMATOCRIT: 38.2 % (ref 36.0–46.0)
HEMOGLOBIN: 13.1 g/dL (ref 12.0–15.0)
Lymphocytes Relative: 11 % — ABNORMAL LOW (ref 12–46)
Lymphs Abs: 0.3 10*3/uL — ABNORMAL LOW (ref 0.7–4.0)
MCH: 34.2 pg — ABNORMAL HIGH (ref 26.0–34.0)
MCHC: 34.3 g/dL (ref 30.0–36.0)
MCV: 99.7 fL (ref 78.0–100.0)
MPV: 10.2 fL (ref 8.6–12.4)
Monocytes Absolute: 0.7 10*3/uL (ref 0.1–1.0)
Monocytes Relative: 23 % — ABNORMAL HIGH (ref 3–12)
NEUTROS ABS: 1.8 10*3/uL (ref 1.7–7.7)
Neutrophils Relative %: 61 % (ref 43–77)
Platelets: 125 10*3/uL — ABNORMAL LOW (ref 150–400)
RBC: 3.83 MIL/uL — AB (ref 3.87–5.11)
RDW: 16.4 % — ABNORMAL HIGH (ref 11.5–15.5)
WBC: 3 10*3/uL — ABNORMAL LOW (ref 4.0–10.5)

## 2014-06-25 LAB — VITAMIN B12: Vitamin B-12: 1269 pg/mL — ABNORMAL HIGH (ref 211–911)

## 2014-06-25 LAB — HEPATIC FUNCTION PANEL
ALK PHOS: 183 U/L — AB (ref 39–117)
ALT: 146 U/L — ABNORMAL HIGH (ref 0–35)
AST: 242 U/L — ABNORMAL HIGH (ref 0–37)
Albumin: 4.2 g/dL (ref 3.5–5.2)
Bilirubin, Direct: 0.3 mg/dL (ref 0.0–0.3)
Indirect Bilirubin: 0.5 mg/dL (ref 0.2–1.2)
TOTAL PROTEIN: 7.3 g/dL (ref 6.0–8.3)
Total Bilirubin: 0.8 mg/dL (ref 0.2–1.2)

## 2014-06-25 LAB — BASIC METABOLIC PANEL WITH GFR
BUN: 12 mg/dL (ref 6–23)
CO2: 23 mEq/L (ref 19–32)
CREATININE: 0.69 mg/dL (ref 0.50–1.10)
Calcium: 9.3 mg/dL (ref 8.4–10.5)
Chloride: 95 mEq/L — ABNORMAL LOW (ref 96–112)
GFR, Est African American: >89 mL/min
GFR, Est Non African American: >89 mL/min
Glucose, Bld: 96 mg/dL (ref 70–99)
Potassium: 4.1 mEq/L (ref 3.5–5.3)
Sodium: 130 mEq/L — ABNORMAL LOW (ref 135–145)

## 2014-06-25 LAB — LIPID PANEL
CHOLESTEROL: 278 mg/dL — AB (ref 0–200)
HDL: 116 mg/dL (ref >39)
LDL Cholesterol: 147 mg/dL — ABNORMAL HIGH (ref 0–99)
Total CHOL/HDL Ratio: 2.4 Ratio
Triglycerides: 73 mg/dL (ref <150)
VLDL: 15 mg/dL (ref 0–40)

## 2014-06-25 LAB — MAGNESIUM: Magnesium: 1.7 mg/dL (ref 1.5–2.5)

## 2014-06-25 LAB — TSH: TSH: 0.338 u[IU]/mL — AB (ref 0.350–4.500)

## 2014-06-25 NOTE — Patient Instructions (Signed)
Encourage you to get the 3D Mammogram Cologuard is an easy to use noninvasive colon cancer screening test based on the latest advances in stool DNA science.   Colon cancer is 3rd most diagnosed cancer and 2nd leading cause of death in both men and women 51 years of age and older despite being one of the most preventable and treatable cancers if found early.   The 3D Mammogram is much more specific and sensitive to pick up breast cancer. For women with fibrocystic breast or lumpy breast it can be hard to determine if it is cancer or not but the 3D mammogram is able to tell this difference which cuts back on unneeded additional tests or scary call backs.  Your ears and sinuses are connected by the eustachian tube. When your sinuses are inflamed, this can close off the tube and cause fluid to collect in your middle ear. This can then cause dizziness, popping, clicking, ringing, and echoing in your ears. This is often NOT an infection and does NOT require antibiotics, it is caused by inflammation so the treatments help the inflammation. This can take a long time to get better so please be patient.  Here are things you can do to help with this: - Try the Flonase or Nasonex. Remember to spray each nostril twice towards the outer part of your eye.  Do not sniff but instead pinch your nose and tilt your head back to help the medicine get into your sinuses.  The best time to do this is at bedtime.Stop if you get blurred vision or nose bleeds.  -While drinking fluids, pinch and hold nose close and swallow, to help open eustachian tubes to drain fluid behind ear drums. -Please pick one of the over the counter allergy medications below and take it once daily for allergies.  It will also help with fluid behind ear drums. Claritin or loratadine cheapest but likely the weakest  Zyrtec or certizine at night because it can make you sleepy The strongest is allegra or fexafinadine  Cheapest at [employer], [employer],  [employer] -can use decongestant over the counter, please do not use if you have high blood pressure or certain heart conditions.   if worsening HA, changes vision/speech, imbalance, weakness go to the ER  Before you even begin to attack a weight-loss plan, it pays to remember this: You are not fat. You have fat. Losing weight isn't about blame or shame; it's simply another achievement to accomplish. Dieting is like any other skill-you have to buckle down and work at it. As long as you act in a smart, reasonable way, you'll ultimately get where you want to be. Here are some weight loss pearls for you.  1. It's Not a Diet. It's a Lifestyle Thinking of a diet as something you're on and suffering through only for the short term doesn't work. To shed weight and keep it off, you need to make permanent changes to the way you eat. It's OK to indulge occasionally, of course, but if you cut calories temporarily and then revert to your old way of eating, you'll gain back the weight quicker than you can say yo-yo. Use it to lose it. Research shows that one of the best predictors of long-term weight loss is how many pounds you drop in the first month. For that reason, nutritionists often suggest being stricter for the first two weeks of your new eating strategy to build momentum. Cut out added sugar and alcohol and avoid unrefined carbs. After  that, figure out how you can reincorporate them in a way that's healthy and maintainable.  2. There's a Right Way to Exercise Working out burns calories and fat and boosts your metabolism by building muscle. But those trying to lose weight are notorious for overestimating the number of calories they burn and underestimating the amount they take in. Unfortunately, your system is biologically programmed to hold on to extra pounds and that means when you start exercising, your body senses the deficit and ramps up its hunger signals. If you're not diligent, you'll eat everything you  burn and then some. Use it to lose it. Cardio gets all the exercise glory, but strength and interval training are the real heroes. They help you build lean muscle, which in turn increases your metabolism and calorie-burning ability 3. Don't Overreact to Mild Hunger Some people have a hard time losing weight because of hunger anxiety. To them, being hungry is bad-something to be avoided at all costs-so they carry snacks with them and eat when they don't need to. Others eat because they're stressed out or bored. While you never want to get to the point of being ravenous (that's when bingeing is likely to happen), a hunger pang, a craving, or the fact that it's 3:00 p.m. should not send you racing for the vending machine or obsessing about the energy bar in your purse. Ideally, you should put off eating until your stomach is growling and it's difficult to concentrate.  Use it to lose it. When you feel the urge to eat, use the HALT method. Ask yourself, Am I really hungry? Or am I angry or anxious, lonely or bored, or tired? If you're still not certain, try the apple test. If you're truly hungry, an apple should seem delicious; if it doesn't, something else is going on. Or you can try drinking water and making yourself busy, if you are still hungry try a healthy snack.  4. Not All Calories Are Created Equal The mechanics of weight loss are pretty simple: Take in fewer calories than you use for energy. But the kind of food you eat makes all the difference. Processed food that's high in saturated fat and refined starch or sugar can cause inflammation that disrupts the hormone signals that tell your brain you're full. The result: You eat a lot more.  Use it to lose it. Clean up your diet. Swap in whole, unprocessed foods, including vegetables, lean protein, and healthy fats that will fill you up and give you the biggest nutritional bang for your calorie buck. In a few weeks, as your brain starts receiving regular  hunger and fullness signals once again, you'll notice that you feel less hungry overall and naturally start cutting back on the amount you eat.  5. Protein, Produce, and Plant-Based Fats Are Your Weight-Loss Trinity Here's why eating the three Ps regularly will help you drop pounds. Protein fills you up. You need it to build lean muscle, which keeps your metabolism humming so that you can torch more fat. People in a weight-loss program who ate double the recommended daily allowance for protein (about 110 grams for a 150-pound woman) lost 70 percent of their weight from fat, while people who ate the RDA lost only about 40 percent, one study found. Produce is packed with filling fiber. "It's very difficult to consume too many calories if you're eating a lot of vegetables. Example: Three cups of broccoli is a lot of food, yet only 93 calories. (Fruit is another  story. It can be easy to overeat and can contain a lot of calories from sugar, so be sure to monitor your intake.) Plant-based fats like olive oil and those in avocados and nuts are healthy and extra satiating.  Use it to lose it. Aim to incorporate each of the three Ps into every meal and snack. People who eat protein throughout the day are able to keep weight off, according to a study in the American Journal of Clinical Nutrition. In addition to meat, poultry and seafood, good sources are beans, lentils, eggs, tofu, and yogurt. As for fat, keep portion sizes in check by measuring out salad dressing, oil, and nut butters (shoot for one to two tablespoons). Finally, eat veggies or a little fruit at every meal. People who did that consumed 308 fewer calories but didn't feel any hungrier than when they didn't eat more produce.  7. How You Eat Is As Important As What You Eat In order for your brain to register that you're full, you need to focus on what you're eating. Sit down whenever you eat, preferably at a table. Turn off the TV or computer, put down  your phone, and look at your food. Smell it. Chew slowly, and don't put another bite on your fork until you swallow. When women ate lunch this attentively, they consumed 30 percent less when snacking later than those who listened to an audiobook at lunchtime, according to a study in the [place] Journal of Nutrition. 8. Weighing Yourself Really Works The scale provides the best evidence about whether your efforts are paying off. Seeing the numbers tick up or down or stagnate is motivation to keep going-or to rethink your approach. A 2015 study at [facility] found that daily weigh-ins helped people lose more weight, keep it off, and maintain that loss, even after two years. Use it to lose it. Step on the scale at the same time every day for the best results. If your weight shoots up several pounds from one weigh-in to the next, don't freak out. Eating a lot of salt the night before or having your period is the likely culprit. The number should return to normal in a day or two. It's a steady climb that you need to do something about. 9. Too Much Stress and Too Little Sleep Are Your Enemies When you're tired and frazzled, your body cranks up the production of cortisol, the stress hormone that can cause carb cravings. Not getting enough sleep also boosts your levels of ghrelin, a hormone associated with hunger, while suppressing leptin, a hormone that signals fullness and satiety. People on a diet who slept only five and a half hours a night for two weeks lost 55 percent less fat and were hungrier than those who slept eight and a half hours, according to a study in the [place] [employer] Journal. Use it to lose it. Prioritize sleep, aiming for seven hours or more a night, which research shows helps lower stress. And make sure you're getting quality zzz's. If a snoring spouse or a fidgety cat wakes you up frequently throughout the night, you may end up getting the equivalent of just four hours of  sleep, according to a study from [facility]. Keep pets out of the bedroom, and use a white-noise app to drown out snoring. 10. You Will Hit a plateau-And You Can Bust Through It As you slim down, your body releases much less leptin, the fullness hormone.  If you're not strength training,  start right now. Building muscle can raise your metabolism to help you overcome a plateau. To keep your body challenged and burning calories, incorporate new moves and more intense intervals into your workouts or add another sweat session to your weekly routine. Alternatively, cut an extra 100 calories or so a day from your diet. Now that you've lost weight, your body simply doesn't need as much fuel.     Marland Kitchen

## 2014-06-25 NOTE — Progress Notes (Addendum)
Complete Physical  Assessment and Plan: 1. Anxiety Anxiety- continue medications, stress management techniques discussed, increase water, good sleep hygiene discussed, increase exercise, and increase veggies.   2. Rheumatoid arthritis involving multiple joints Send labs to Dr. Ancil Linsey and Dr. Dareen Piano.   3. Depression, remssion Depression- continue medications, stress management techniques discussed, increase water, good sleep hygiene discussed, increase exercise, and increase veggies.   4. Herpes simplex type II infection Continue meds PRN  5. Essential hypertension - continue medications, DASH diet, exercise and monitor at home. Call if greater than 130/80.  - CBC with Differential/Platelet - BASIC METABOLIC PANEL WITH GFR - Hepatic function panel - TSH - Urinalysis, Routine w reflex microscopic - Microalbumin / creatinine urine ratio - EKG 12-Lead - [place], RETROPERITNL ABD,  LTD  6. Gastroesophageal reflux disease with esophagitis Continue PPI/H2 blocker, diet discussed  7. Hyperlipidemia -continue medications, check lipids, decrease fatty foods, increase activity.  - Lipid panel  8. Obesity Obesity with co morbidities- long discussion about weight loss, diet, and exercise  9. B12 deficiency - Vitamin B12  10. Vitamin D deficiency - Vit D  25 hydroxy (rtn osteoporosis monitoring)  11. Medication management - Magnesium  12. Elevated liver function tests Check labs, avoid tylenol, alcohol, weight loss advised.   13. Noncompliance Noncompliance- emphasized compliance with medications and appointments, patient expressed understanding of risks.   14. Hot flashes Continue prempro for now however LONG discussion about how needs to stay on bASA and needs to get MGM early, declines Dx MGM, willing to get screening MGM.   15. Urinary frequency ? ICS from stress, rule out infection - Urine culture  16. Colon cancer screening Declines- long discussion about risk of not  getting including cancer and death, understands and declines  17. Post-nasal drip Stop afrin, switch allegra, and given nasal spray samples.   Discussed med's effects and SE's. Screening labs and tests as requested with regular follow-up as recommended.  Addendum: Strikingly abnormal labs. Patient informed to stop tylenol, alcohol, and Arava and plaquenil due to WBC and LFTs, follow up 1 week, sent labs to Dr. Ancil Linsey, told to go to ER if severe AB pain, nausea, vomiting, etc.  Hyponatremia- she will cut celexa in half, will recheck 1 week.  + Hyperthyroidism- will check next week, if still abnormal will get [place] and 24 hour RAI uptake.  Lab Results  Component Value Date   ALT 146* 06/25/2014   AST 242* 06/25/2014   ALKPHOS 183* 06/25/2014   BILITOT 0.8 06/25/2014   Lab Results  Component Value Date   WBC 3.0* 06/25/2014   HGB 13.1 06/25/2014   HCT 38.2 06/25/2014   MCV 99.7 06/25/2014   PLT 125* 06/25/2014   Lab Results  Component Value Date   CREATININE 0.69 06/25/2014   BUN 12 06/25/2014   NA 130* 06/25/2014   K 4.1 06/25/2014   CL 95* 06/25/2014   CO2 23 06/25/2014   Lab Results  Component Value Date   TSH 0.338* 06/25/2014     HPI  49 y.o. female  presents for a complete physical.  Her blood pressure has been controlled at home, on losartan 100mg  QD, today their BP is BP: 128/78 mmHg, Her BP is better since she quit her job last week due to stress, she is currently looking for a job that is less stressful.  She does workout, not regularly, just starting to walk on treadmill for 10 mins She denies chest pain, shortness of breath, dizziness.  She  is not on cholesterol medication, occ FO and RYRE and denies myalgias. Her cholesterol is at goal. The cholesterol last visit was:   Lab Results  Component Value Date   CHOL 253* 05/12/2014   HDL 84 05/12/2014   LDLCALC 133* 05/12/2014   TRIG 179* 05/12/2014   CHOLHDL 3.0 05/12/2014   Last K0U in the office was:  Lab  Results  Component Value Date   HGBA1C 5.3 08/21/2013  Patient is not on Vitamin D supplement.   Lab Results  Component Value Date   VD25OH 35 05/12/2014  Has B12 def, on supplement when remembers  Lab Results  Component Value Date   VITAMINB12 1785* 08/21/2013  She has anxiety/depression and insomnia and takes xanax due to this and is on celexa.  Has GERD is on protonix and is controlled.  She has hot flashes and is on prempro for this and states it is better since stopping her job.  She is on bASA.  Has history of elevated LFTs.  Lab Results  Component Value Date   ALT 31 05/12/2014   AST 40* 05/12/2014   ALKPHOS 126* 05/12/2014   BILITOT 0.4 05/12/2014  She has a history of hypokalemia due to HCTZ but she is now on 1/2 of HCTZ and she is off potassium pills.  Lab Results  Component Value Date   CREATININE 0.71 05/12/2014   BUN 9 05/12/2014   NA 134* 05/12/2014   K 4.0 05/12/2014   CL 97 05/12/2014   CO2 24 05/12/2014   She has RA and is on plaquenil, was following with Dr. Dareen Piano but has appointment with Dr. Ancil Linsey in [place] in March.  BMI is Body mass index is 31.81 kg/(m^2)., she is working on diet and exercise. Wt Readings from Last 3 Encounters:  06/25/14 197 lb (89.359 kg)  05/12/14 194 lb (87.998 kg)  11/06/13 204 lb (92.534 kg)    Current Medications:  Current Outpatient Prescriptions on File Prior to Visit  Medication Sig Dispense Refill  . acyclovir (ZOVIRAX) 400 MG tablet Take 1 tablet (400 mg total) by mouth 2 (two) times daily. 60 tablet 11  . ALPRAZolam (XANAX) 0.5 MG tablet TAKE 1 TABLET BY MOUTH 3 TIMES A DAY AS NEEDED 90 tablet 1  . aspirin 81 MG tablet Take 81 mg by mouth daily.    Marland Kitchen atenolol (TENORMIN) 100 MG tablet TAKE 1 TABLET BY MOUTH TWICE DAILY 180 tablet 0  . butalbital-acetaminophen-caffeine (FIORICET, ESGIC) 50-325-40 MG per tablet Take 1 tablet by mouth 2 (two) times daily as needed. For migraine    . citalopram (CELEXA) 40 MG tablet Take  1 tablet (40 mg total) by mouth daily. 30 tablet 2  . Cyanocobalamin (B-12) 1000 MCG SUBL Place under the tongue.    . hydrochlorothiazide (HYDRODIURIL) 25 MG tablet TAKE 0.5 TABLETS (12.5 MG TOTAL) BY MOUTH DAILY. 30 tablet 0  . hydroxychloroquine (PLAQUENIL) 200 MG tablet Take 400 mg by mouth daily before breakfast.     . hyoscyamine (LEVSIN, ANASPAZ) 0.125 MG tablet Take 0.125 mg by mouth every 4 (four) hours as needed.    . leflunomide (ARAVA) 10 MG tablet Take 10 mg by mouth daily before breakfast.     . losartan (COZAAR) 100 MG tablet Take 1 tablet (100 mg total) by mouth daily. 30 tablet 3  . MIMVEY 1-0.5 MG per tablet TAKE 1 TABLET BY MOUTH ONCE DAILY 28 tablet 3  . pantoprazole (PROTONIX) 40 MG tablet TAKE 1 TABLET BY MOUTH EVERY  DAY 30 tablet 5  . PREMPRO 0.45-1.5 MG per tablet TAKE 1 TABLET BY MOUTH EVERY DAY 28 tablet 2   No current facility-administered medications on file prior to visit.   Health Maintenance:   Immunization History  Administered Date(s) Administered  . Influenza-Unspecified 04/09/2013  . Pneumococcal-Unspecified 06/13/1995  . Tdap 06/13/2011   TDAP  2013 Pneumovax: 1997 Flu vaccine: 03/2013 at work Zostavax: N/A  Pap: 06/22/2012 normal - never abnormal pap- due 2017 MGM: 06/28/2012 OVER DUE DEXA: N/A  Colonoscopy: Declines and declines stool cards EGD: N/A  Patient Care Team: Lucky Cowboy, MD as PCP - General (Internal Medicine) Lenna Sciara, MD as Referring Physician (Internal Medicine) Fredrich Birks, OD as Referring Physician (Optometry) [place] Callas, DDS (Dentistry) Karn Cassis, MD as Consulting Physician (Neurosurgery)  Allergies:  Allergies  Allergen Reactions  . Codeine Itching   Medical History:  Past Medical History  Diagnosis Date  . Neuromuscular disorder   . Leg pain, right   . Anxiety   . Arthritis     rheumatoid arthritis- hands, degeneration of lumbar  spine   . GERD (gastroesophageal reflux disease)   .  Mental disorder   . Eye irritation     R eye- redness- from removing contact lense  . Hypertension   . Hyperlipidemia   . Allergy   . Herpes simplex type II infection   . Obesity   . Depression    Surgical History:  Past Surgical History  Procedure Laterality Date  . Lumbar laminectomy/decompression microdiscectomy  10/18/2011    Procedure: LUMBAR LAMINECTOMY/DECOMPRESSION MICRODISCECTOMY;  Surgeon: Karn Cassis, MD;  Location: [facility];  Service: Neurosurgery;  Laterality: Right;  Redo Right Lumbar four-fivelaminectomy microdiskectomy  . Back surgery      09/2011, first back surgery- 2000    . Eye surgery      lasik procedure very long ago- not successful  . Cholecystectomy      09/2008  . Parotidectomy Left    Family History:  Family History  Problem Relation Age of Onset  . Hypertension Mother   . Stroke Mother   . COPD Father   . Heart disease Father   . Heart disease Sister    Social History:  History  Substance Use Topics  . Smoking status: Never Smoker   . Smokeless tobacco: Never Used  . Alcohol Use: Yes     Comment: on holidays only   Review of Systems: Review of Systems  Constitutional: Negative.   HENT: Positive for congestion (has been using afrin x 3-4 weeks). Negative for ear discharge, ear pain, hearing loss, nosebleeds, sore throat and tinnitus.   Eyes: Negative.   Respiratory: Positive for cough (with nasal drip). Negative for hemoptysis, sputum production, shortness of breath, wheezing and stridor.   Cardiovascular: Positive for palpitations (with stress, 90% better since quit job). Negative for chest pain, orthopnea, claudication, leg swelling and PND.  Gastrointestinal: Negative.   Genitourinary: Negative.        Some urge incontinence at work, has been better without stress.   Musculoskeletal: Negative.   Skin: Negative.   Neurological: Negative.  Negative for headaches.  Psychiatric/Behavioral: Negative.  Negative for depression. The  patient is not nervous/anxious and does not have insomnia.     Physical Exam: Estimated body mass index is 31.81 kg/(m^2) as calculated from the following:   Height as of this encounter: 5\' 6"  (1.676 m).   Weight as of this encounter: 197 lb (89.359 kg). BP 128/78  mmHg  Pulse 84  Temp(Src) 97.9 F (36.6 C)  Resp 16  Ht 5\' 6"  (1.676 m)  Wt 197 lb (89.359 kg)  BMI 31.81 kg/m2 General Appearance: Well nourished, in no apparent distress.  Eyes: PERRLA, EOMs, conjunctiva no swelling or erythema, normal fundi and vessels.  Sinuses: No Frontal/maxillary tenderness  ENT/Mouth: Ext aud canals clear, normal light reflex with TMs without erythema, bulging. Good dentition. No erythema, swelling, or exudate on post pharynx. Tonsils not swollen or erythematous. Hearing normal.  Neck: Supple, thyroid normal. No bruits  Respiratory: Respiratory effort normal, BS equal bilaterally without rales, rhonchi, wheezing or stridor.  Cardio: RRR without murmurs, rubs or gallops. Brisk peripheral pulses without edema.  Chest: symmetric, with normal excursions and percussion.  Breasts: Symmetric, right breast at 7-8oclock mobile deeper 2cm nodule, nontender, without nipple discharge, retractions.  Abdomen: Soft, nontender, no guarding, rebound, hernias, masses, or organomegaly. .  Lymphatics: Non tender without lymphadenopathy.  Genitourinary: defer Musculoskeletal: Full ROM all peripheral extremities,5/5 strength, and normal gait.  Skin: Warm, dry without rashes, lesions, ecchymosis. Neuro: Cranial nerves intact, reflexes equal bilaterally. Normal muscle tone, no cerebellar symptoms. Sensation intact.  Psych: Awake and oriented X 3, normal affect, Insight and Judgment appropriate.   EKG: WNL, PRWP, no ST changes. AORTA SCAN: WNL   Quentin Mulling 9:25 AM [facility]

## 2014-06-26 LAB — MICROALBUMIN / CREATININE URINE RATIO: CREATININE, URINE: 19.9 mg/dL

## 2014-06-26 LAB — URINE CULTURE

## 2014-06-26 LAB — URINALYSIS, ROUTINE W REFLEX MICROSCOPIC
Bilirubin Urine: NEGATIVE
Glucose, UA: NEGATIVE mg/dL
HGB URINE DIPSTICK: NEGATIVE
Ketones, ur: NEGATIVE mg/dL
LEUKOCYTES UA: NEGATIVE
Nitrite: NEGATIVE
PH: 6 (ref 5.0–8.0)
Protein, ur: NEGATIVE mg/dL
Specific Gravity, Urine: 1.007 (ref 1.005–1.030)
UROBILINOGEN UA: 0.2 mg/dL (ref 0.0–1.0)

## 2014-06-26 LAB — VITAMIN D 25 HYDROXY (VIT D DEFICIENCY, FRACTURES): Vit D, 25-Hydroxy: 25 ng/mL — ABNORMAL LOW (ref 30–100)

## 2014-06-26 NOTE — Addendum Note (Signed)
Addended by: Quentin Mulling R on: 06/26/2014 09:03 AM   Modules accepted: Kipp Brood

## 2014-07-08 ENCOUNTER — Encounter: Payer: Self-pay | Admitting: Physician Assistant

## 2014-07-08 ENCOUNTER — Encounter: Payer: Self-pay | Admitting: Internal Medicine

## 2014-07-08 ENCOUNTER — Ambulatory Visit (INDEPENDENT_AMBULATORY_CARE_PROVIDER_SITE_OTHER): Payer: Managed Care, Other (non HMO) | Admitting: Physician Assistant

## 2014-07-08 VITALS — BP 158/90 | HR 88 | Temp 97.7°F | Resp 16 | Ht 66.0 in | Wt 199.0 lb

## 2014-07-08 DIAGNOSIS — F419 Anxiety disorder, unspecified: Secondary | ICD-10-CM

## 2014-07-08 DIAGNOSIS — R197 Diarrhea, unspecified: Secondary | ICD-10-CM

## 2014-07-08 DIAGNOSIS — R1013 Epigastric pain: Secondary | ICD-10-CM

## 2014-07-08 DIAGNOSIS — I10 Essential (primary) hypertension: Secondary | ICD-10-CM

## 2014-07-08 MED ORDER — PROMETHAZINE HCL 25 MG PO TABS: 25.0000 mg | ORAL_TABLET | Freq: Four times a day (QID) | ORAL | Status: DC | PRN

## 2014-07-08 NOTE — Progress Notes (Signed)
Assessment and Plan: Hypertension- get back on losartan, continue atenolol.  Epigastric pain/nausea/vomiting/diarrhea- ? Pancreatitis, liver failure, ulcer, electrolyte abnormality, withdrawal- patient refuses labs/ER, long discussion about the risk of liver failure/pancreatitis/death, understands, suppose to get insurance tomorrow, will come back for labs but will go to ER if pain is worse, dizziness, blood/dark stools, etc. Will do liquid diet/bowel rest in the mean time, will do samples of dexilant, phenergan, declines pain med at this time Withdrawal-? Start back on 1/2 of celexa Hyperthyroidism- will recheck TSH with labs, may need RAI uptake.    Will need CBC, BMP, LFT, TSH, acute hep panel, pancreatic enzymes, when she returns or she needs to go to ER.   HPI 49 y.o.female presents for 1 week follow up for abnormal labs. She was seen 01/27 for over due OV for CPE. She has a history of noncompliance, RF, HTN, chol, preDM, GERD. Last visit she had grossly abnormal LFT and WBC, patient instructed to stop tylenol, alcohol, and Arava and plaquenil and follow up here in 1 week. She also had hyponatremia, was suppose to cut celexa in half and she has hyperthyroidism with possible symptoms of hyperthyroidism with diarrhea, anxiety, elevated BP.  Lab Results  Component Value Date   WBC 3.0* 06/25/2014   HGB 13.1 06/25/2014   HCT 38.2 06/25/2014   MCV 99.7 06/25/2014   PLT 125* 06/25/2014   Lab Results  Component Value Date   CREATININE 0.69 06/25/2014   BUN 12 06/25/2014   NA 130* 06/25/2014   K 4.1 06/25/2014   CL 95* 06/25/2014   CO2 23 06/25/2014   Lab Results  Component Value Date   ALT 146* 06/25/2014   AST 242* 06/25/2014   ALKPHOS 183* 06/25/2014   BILITOT 0.8 06/25/2014   She stopped all of her medications but the atenolol and protonix 4 days a go, and she states for 2 weeks she has had very sharp epigastric pain with some radiation to the center of her back when it is very  bad, gnawing sensation with vomiting and diarrhea, she states the epigastric pain is worse with lying down. Has vomiting/diarrhea every hour last night. She has a lot of beltching/gas. She has been taking 4 aleve b/c she was told to stop the tylenol. She has not had any alcohol.  Her BP is elevated, she is on her atenolol but no on losartan 100mg  QD, BP: (!) 158/90 mmHg   Past Medical History  Diagnosis Date  . Neuromuscular disorder   . Leg pain, right   . Anxiety   . Rheumatoid arthritis involving multiple joints     rheumatoid arthritis- hands, degeneration of lumbar  spine   . GERD (gastroesophageal reflux disease)   . Mental disorder   . Eye irritation     R eye- redness- from removing contact lense  . Hypertension   . Hyperlipidemia   . Allergy   . Herpes simplex type II infection   . Obesity   . Depression      Allergies  Allergen Reactions  . Codeine Itching      Current Outpatient Prescriptions on File Prior to Visit  Medication Sig Dispense Refill  . acyclovir (ZOVIRAX) 400 MG tablet Take 1 tablet (400 mg total) by mouth 2 (two) times daily. 60 tablet 11  . ALPRAZolam (XANAX) 0.5 MG tablet TAKE 1 TABLET BY MOUTH 3 TIMES A DAY AS NEEDED 90 tablet 1  . aspirin 81 MG tablet Take 81 mg by mouth daily.     atenolol (TENORMIN) 100 MG tablet TAKE 1 TABLET BY MOUTH TWICE DAILY 180 tablet 0  . butalbital-acetaminophen-caffeine (FIORICET, ESGIC) 50-325-40 MG per tablet Take 1 tablet by mouth 2 (two) times daily as needed. For migraine    . citalopram (CELEXA) 40 MG tablet Take 1 tablet (40 mg total) by mouth daily. 30 tablet 2  . Cyanocobalamin (B-12) 1000 MCG SUBL Place under the tongue.    . hydrochlorothiazide (HYDRODIURIL) 25 MG tablet TAKE 0.5 TABLETS (12.5 MG TOTAL) BY MOUTH DAILY. 30 tablet 0  . hydroxychloroquine (PLAQUENIL) 200 MG tablet Take 400 mg by mouth daily before breakfast.     . hyoscyamine (LEVSIN, ANASPAZ) 0.125 MG tablet Take 0.125 mg by mouth every 4  (four) hours as needed.    . leflunomide (ARAVA) 10 MG tablet Take 10 mg by mouth daily before breakfast.     . losartan (COZAAR) 100 MG tablet Take 1 tablet (100 mg total) by mouth daily. 30 tablet 3  . pantoprazole (PROTONIX) 40 MG tablet TAKE 1 TABLET BY MOUTH EVERY DAY 30 tablet 5  . PREMPRO 0.45-1.5 MG per tablet TAKE 1 TABLET BY MOUTH EVERY DAY 28 tablet 2   No current facility-administered medications on file prior to visit.    ROS: all negative except above.   Physical Exam: Filed Weights   07/08/14 1159  Weight: 199 lb (90.266 kg)   BP 158/90 mmHg  Pulse 88  Temp(Src) 97.7 F (36.5 C)  Resp 16  Ht 5\' 6"  (1.676 m)  Wt 199 lb (90.266 kg)  BMI 32.13 kg/m2 General Appearance: Well nourished, in no apparent distress. Eyes: PERRLA, EOMs, conjunctiva no swelling or erythema Sinuses: No Frontal/maxillary tenderness ENT/Mouth: Ext aud canals clear, TMs without erythema, bulging. No erythema, swelling, or exudate on post pharynx.  Tonsils not swollen or erythematous. Hearing normal.  Neck: Supple, thyroid normal.  Respiratory: Respiratory effort normal, BS equal bilaterally without rales, rhonchi, wheezing or stridor.  Cardio: RRR with no MRGs. Brisk peripheral pulses without edema.  Abdomen: Soft, + BS.  + epigastric tenderness, + palpable liver no guarding, rebound, hernias.  Lymphatics: Non tender without lymphadenopathy.  Musculoskeletal: Full ROM, 5/5 strength, normal gait.  Skin: Warm, dry without rashes, lesions, ecchymosis.  Neuro: Cranial nerves intact. Normal m uscle tone, no cerebellar symptoms. Sensation intact.  Psych: Awake and oriented X 3, normal affect, Insight and Judgment appropriate.     , PA-C 12:06 PM [facility]

## 2014-07-08 NOTE — Patient Instructions (Addendum)
Restart losartan restrart celexa 1/2 pill   Acute Pancreatitis Acute pancreatitis is a disease in which the pancreas becomes suddenly inflamed. The pancreas is a large gland located behind your stomach. The pancreas produces enzymes that help digest food. The pancreas also releases the hormones glucagon and insulin that help regulate blood sugar. Damage to the pancreas occurs when the digestive enzymes from the pancreas are activated and begin attacking the pancreas before being released into the intestine. Most acute attacks last a couple of days and can cause serious complications. Some people become dehydrated and develop low blood pressure. In severe cases, bleeding into the pancreas can lead to shock and can be life-threatening. The lungs, heart, and kidneys may fail. CAUSES  Pancreatitis can happen to anyone. In some cases, the cause is unknown. Most cases are caused by:  Alcohol abuse.  Gallstones. Other less common causes are:  Certain medicines.  Exposure to certain chemicals.  Infection.  Damage caused by an accident (trauma).  Abdominal surgery. SYMPTOMS   Pain in the upper abdomen that may radiate to the back.  Tenderness and swelling of the abdomen.  Nausea and vomiting. DIAGNOSIS  Your caregiver will perform a physical exam. Blood and stool tests may be done to confirm the diagnosis. Imaging tests may also be done, such as X-rays, CT scans, or an ultrasound of the abdomen. TREATMENT  Treatment usually requires a stay in the hospital. Treatment may include:  Pain medicine.  Fluid replacement through an intravenous line (IV).  Placing a tube in the stomach to remove stomach contents and control vomiting.  Not eating for 3 or 4 days. This gives your pancreas a rest, because enzymes are not being produced that can cause further damage.  Antibiotic medicines if your condition is caused by an infection.  Surgery of the pancreas or gallbladder. HOME CARE  INSTRUCTIONS   Follow the diet advised by your caregiver. This may involve avoiding alcohol and decreasing the amount of fat in your diet.  Eat smaller, more frequent meals. This reduces the amount of digestive juices the pancreas produces.  Drink enough fluids to keep your urine clear or pale yellow.  Only take over-the-counter or prescription medicines as directed by your caregiver.  Avoid drinking alcohol if it caused your condition.  Do not smoke.  Get plenty of rest.  Check your blood sugar at home as directed by your caregiver.  Keep all follow-up appointments as directed by your caregiver. SEEK MEDICAL CARE IF:   You do not recover as quickly as expected.  You develop new or worsening symptoms.  You have persistent pain, weakness, or nausea.  You recover and then have another episode of pain. SEEK IMMEDIATE MEDICAL CARE IF:   You are unable to eat or keep fluids down.  Your pain becomes severe.  You have a fever or persistent symptoms for more than 2 to 3 days.  You have a fever and your symptoms suddenly get worse.  Your skin or the white part of your eyes turn yellow (jaundice).  You develop vomiting.  You feel dizzy, or you faint.  Your blood sugar is high (over 300 mg/dL). MAKE SURE YOU:   Understand these instructions.  Will watch your condition.  Will get help right away if you are not doing well or get worse. Document Released: 05/16/2005 Document Revised: 11/15/2011 Document Reviewed: 08/25/2011 [facility] Patient Information 2015 [place], [place]. This information is not intended to replace advice given to you by your health care provider.  Make sure you discuss any questions you have with your health care provider.  

## 2014-07-10 ENCOUNTER — Other Ambulatory Visit: Payer: Managed Care, Other (non HMO)

## 2014-07-10 DIAGNOSIS — R197 Diarrhea, unspecified: Secondary | ICD-10-CM

## 2014-07-10 DIAGNOSIS — Z91199 Patient's noncompliance with other medical treatment and regimen due to unspecified reason: Secondary | ICD-10-CM

## 2014-07-10 DIAGNOSIS — F419 Anxiety disorder, unspecified: Secondary | ICD-10-CM

## 2014-07-10 DIAGNOSIS — R7989 Other specified abnormal findings of blood chemistry: Secondary | ICD-10-CM

## 2014-07-10 DIAGNOSIS — Z9119 Patient's noncompliance with other medical treatment and regimen: Secondary | ICD-10-CM

## 2014-07-10 DIAGNOSIS — R1013 Epigastric pain: Secondary | ICD-10-CM

## 2014-07-10 DIAGNOSIS — R945 Abnormal results of liver function studies: Principal | ICD-10-CM

## 2014-07-10 LAB — CBC WITH DIFFERENTIAL/PLATELET
Basophils Absolute: 0.1 10*3/uL (ref 0.0–0.1)
Basophils Relative: 2 % — ABNORMAL HIGH (ref 0–1)
EOS PCT: 1 % (ref 0–5)
Eosinophils Absolute: 0.1 10*3/uL (ref 0.0–0.7)
HCT: 35.7 % — ABNORMAL LOW (ref 36.0–46.0)
Hemoglobin: 12 g/dL (ref 12.0–15.0)
LYMPHS ABS: 0.6 10*3/uL — AB (ref 0.7–4.0)
LYMPHS PCT: 10 % — AB (ref 12–46)
MCH: 33.1 pg (ref 26.0–34.0)
MCHC: 33.6 g/dL (ref 30.0–36.0)
MCV: 98.3 fL (ref 78.0–100.0)
MPV: 11.5 fL (ref 8.6–12.4)
Monocytes Absolute: 0.8 10*3/uL (ref 0.1–1.0)
Monocytes Relative: 13 % — ABNORMAL HIGH (ref 3–12)
NEUTROS ABS: 4.7 10*3/uL (ref 1.7–7.7)
Neutrophils Relative %: 74 % (ref 43–77)
Platelets: 113 10*3/uL — ABNORMAL LOW (ref 150–400)
RBC: 3.63 MIL/uL — AB (ref 3.87–5.11)
RDW: 16.9 % — ABNORMAL HIGH (ref 11.5–15.5)
WBC: 6.4 10*3/uL (ref 4.0–10.5)

## 2014-07-10 LAB — BASIC METABOLIC PANEL WITH GFR
BUN: 15 mg/dL (ref 6–23)
CALCIUM: 8.8 mg/dL (ref 8.4–10.5)
CO2: 18 mEq/L — ABNORMAL LOW (ref 19–32)
CREATININE: 0.84 mg/dL (ref 0.50–1.10)
Chloride: 99 mEq/L (ref 96–112)
GFR, EST NON AFRICAN AMERICAN: 81 mL/min
Glucose, Bld: 72 mg/dL (ref 70–99)
Potassium: 3.7 mEq/L (ref 3.5–5.3)
Sodium: 134 mEq/L — ABNORMAL LOW (ref 135–145)

## 2014-07-10 LAB — HEPATIC FUNCTION PANEL
ALT: 118 U/L — ABNORMAL HIGH (ref 0–35)
AST: 186 U/L — ABNORMAL HIGH (ref 0–37)
Albumin: 4.1 g/dL (ref 3.5–5.2)
Alkaline Phosphatase: 220 U/L — ABNORMAL HIGH (ref 39–117)
BILIRUBIN INDIRECT: 1.2 mg/dL (ref 0.2–1.2)
BILIRUBIN TOTAL: 1.9 mg/dL — AB (ref 0.2–1.2)
Bilirubin, Direct: 0.7 mg/dL — ABNORMAL HIGH (ref 0.0–0.3)
TOTAL PROTEIN: 6.7 g/dL (ref 6.0–8.3)

## 2014-07-10 LAB — LIPASE: LIPASE: 42 U/L (ref 0–75)

## 2014-07-10 LAB — AMYLASE: Amylase: 18 U/L (ref 0–105)

## 2014-07-10 NOTE — Patient Instructions (Signed)

## 2014-07-11 ENCOUNTER — Telehealth: Payer: Self-pay | Admitting: Physician Assistant

## 2014-07-11 LAB — HEPATITIS C ANTIBODY: HCV Ab: NEGATIVE

## 2014-07-11 LAB — TSH: TSH: 1.498 u[IU]/mL (ref 0.350–4.500)

## 2014-07-11 LAB — HEPATITIS A ANTIBODY, TOTAL: Hep A Total Ab: NONREACTIVE

## 2014-07-11 LAB — HEPATITIS B CORE ANTIBODY, TOTAL: Hep B Core Total Ab: NONREACTIVE

## 2014-07-11 LAB — HEPATITIS B SURFACE ANTIBODY,QUALITATIVE: HEP B S AB: NEGATIVE

## 2014-07-11 NOTE — Telephone Encounter (Signed)
Michele Cooley, 49 y.o. female with history of cholecystectomy, RA, had strikingly abnormal labs on 06/25/2014. Patient informed to stop tylenol, alcohol, and Arava and plaquenil due to WBC and LFTs.Cut celexa and HCTZ in half due to hyponatremia and has hyperthyroidism. She had lost her insurance in the interim and did not want to get labs until 07/10/2014, patient before this reported 2 weeks of a very sharp epigastric pain with some radiation to the center of her back, gnawing sensation with vomiting and diarrhea,  worse with lying down. She was put on a liquid diet and labs were recollected 07/10/2014.   Newest labs showed continuing LFT elevation with possible metabolic acidosis with fear from possible lactic acidosis. Patient was called, states she is feeling better but after talking with husband Thayer Ohm, he states she has been hallucinating. Patient and husband instructed to go to the ER. Will go to ER in [facility].

## 2014-07-13 ENCOUNTER — Encounter: Payer: Self-pay | Admitting: Physician Assistant

## 2014-07-14 LAB — HEPATITIS B E ANTIBODY: HEPATITIS BE ANTIBODY: NONREACTIVE

## 2014-07-15 ENCOUNTER — Other Ambulatory Visit: Payer: Self-pay | Admitting: Internal Medicine

## 2014-07-25 ENCOUNTER — Encounter: Payer: Self-pay | Admitting: Internal Medicine

## 2014-07-28 ENCOUNTER — Telehealth: Payer: Self-pay | Admitting: Physician Assistant

## 2014-07-28 MED ORDER — HYOSCYAMINE SULFATE 0.125 MG PO TABS: 0.1250 mg | ORAL_TABLET | ORAL | Status: DC | PRN

## 2014-07-28 MED ORDER — ONDANSETRON HCL 4 MG PO TABS: 4.0000 mg | ORAL_TABLET | Freq: Every day | ORAL | Status: AC | PRN

## 2014-07-28 NOTE — Telephone Encounter (Signed)
Patient with history of elevated LFTs, alcoholic hepatitis, calls with office with diarrhea, for 2 days, better today, and vomiting. Using imodium and phenergan. Will suggest stopping alcohol, go to urgent care/ER if severe AB pain, go to liquid diet for a few days and continue meds.

## 2014-07-29 ENCOUNTER — Ambulatory Visit (INDEPENDENT_AMBULATORY_CARE_PROVIDER_SITE_OTHER): Payer: Managed Care, Other (non HMO) | Admitting: Physician Assistant

## 2014-07-29 ENCOUNTER — Encounter: Payer: Self-pay | Admitting: Physician Assistant

## 2014-07-29 VITALS — BP 138/92 | HR 76 | Temp 97.7°F | Resp 16 | Ht 66.0 in | Wt 196.0 lb

## 2014-07-29 DIAGNOSIS — Z9119 Patient's noncompliance with other medical treatment and regimen: Secondary | ICD-10-CM

## 2014-07-29 DIAGNOSIS — R7989 Other specified abnormal findings of blood chemistry: Secondary | ICD-10-CM

## 2014-07-29 DIAGNOSIS — R443 Hallucinations, unspecified: Secondary | ICD-10-CM

## 2014-07-29 DIAGNOSIS — I10 Essential (primary) hypertension: Secondary | ICD-10-CM

## 2014-07-29 DIAGNOSIS — R197 Diarrhea, unspecified: Secondary | ICD-10-CM

## 2014-07-29 DIAGNOSIS — R945 Abnormal results of liver function studies: Secondary | ICD-10-CM

## 2014-07-29 DIAGNOSIS — R58 Hemorrhage, not elsewhere classified: Secondary | ICD-10-CM

## 2014-07-29 DIAGNOSIS — Z91199 Patient's noncompliance with other medical treatment and regimen due to unspecified reason: Secondary | ICD-10-CM

## 2014-07-29 DIAGNOSIS — R1013 Epigastric pain: Secondary | ICD-10-CM

## 2014-07-29 LAB — CBC WITH DIFFERENTIAL/PLATELET
Basophils Absolute: 0.1 10*3/uL (ref 0.0–0.1)
Basophils Relative: 2 % — ABNORMAL HIGH (ref 0–1)
EOS PCT: 2 % (ref 0–5)
Eosinophils Absolute: 0.1 10*3/uL (ref 0.0–0.7)
HEMATOCRIT: 36.3 % (ref 36.0–46.0)
HEMOGLOBIN: 12.2 g/dL (ref 12.0–15.0)
LYMPHS ABS: 0.8 10*3/uL (ref 0.7–4.0)
LYMPHS PCT: 21 % (ref 12–46)
MCH: 33.5 pg (ref 26.0–34.0)
MCHC: 33.6 g/dL (ref 30.0–36.0)
MCV: 99.7 fL (ref 78.0–100.0)
MONO ABS: 0.7 10*3/uL (ref 0.1–1.0)
MONOS PCT: 19 % — AB (ref 3–12)
MPV: 11.3 fL (ref 8.6–12.4)
Neutro Abs: 2.1 10*3/uL (ref 1.7–7.7)
Neutrophils Relative %: 56 % (ref 43–77)
Platelets: 125 10*3/uL — ABNORMAL LOW (ref 150–400)
RBC: 3.64 MIL/uL — AB (ref 3.87–5.11)
RDW: 16.9 % — ABNORMAL HIGH (ref 11.5–15.5)
WBC: 3.7 10*3/uL — AB (ref 4.0–10.5)

## 2014-07-29 NOTE — Patient Instructions (Signed)

## 2014-07-29 NOTE — Progress Notes (Signed)
Assessment and Plan: Nausea vomiting and diarrhea- check labs, ? Pancreatitis, drug use, liver failure- will check labs and get AB [place].  Stop NSAIDS RA- needs to follow up sooner for medicaitons  Hallucinations- normal neuro- will check for drug use and check labs.   HPI 49 Cooley presents for follow up. She was seen in the office mutliple times for epigastric pain/nausea/vomiting/diarrhea, she was sent to ER for elevated liver functions and hallucinations. It was uncovered that she was using alcohol regularly and stopped due to LFTs and was experiencing withdrawals. However she denies heavy using of alcohol, has not had a month and a half without drinking.  She states she was at the collesium this past Friday when she had imbalance, Saturday AM had burping/gas and vomiting. Every time she drinks water she would vomit with diarrhea, took 6 imodium and levsin which helps. She went on liquid diet Saturday and Sunday, Sunday afternoon she had stopped vomiting. She has been having extreme fatigue. She has been having aleve 2 in the AM and 2 in the evening. She is off her RA medication, has appointment march 29th. She admits to some hallucinations over the last month, but states she feels it is her cataracts.   Lab Results  Component Value Date   ALT 118* 07/10/2014   AST 186* 07/10/2014   ALKPHOS 220* 07/10/2014   BILITOT 1.9* 07/10/2014   Lab Results  Component Value Date   WBC 6.4 07/10/2014   HGB 12.0 07/10/2014   HCT 35.7* 07/10/2014   MCV 98.3 07/10/2014   PLT 113* 07/10/2014   Lab Results  Component Value Date   TSH 1.498 07/10/2014     Past Medical History  Diagnosis Date  . Neuromuscular disorder   . Leg pain, right   . Anxiety   . Rheumatoid arthritis involving multiple joints     rheumatoid arthritis- hands, degeneration of lumbar  spine   . GERD (gastroesophageal reflux disease)   . Mental disorder   . Eye irritation     R eye- redness- from removing contact lense  .  Hypertension   . Hyperlipidemia   . Allergy   . Herpes simplex type II infection   . Obesity   . Depression   . Alcoholism      Allergies  Allergen Reactions  . Codeine Itching      Current Outpatient Prescriptions on File Prior to Visit  Medication Sig Dispense Refill  . acyclovir (ZOVIRAX) 400 MG tablet Take 1 tablet (400 mg total) by mouth 2 (two) times daily. 60 tablet 11  . ALPRAZolam (XANAX) 0.5 MG tablet TAKE 1 TABLET BY MOUTH 3 TIMES A DAY AS NEEDED 90 tablet 1  . aspirin 81 MG tablet Take 81 mg by mouth daily.    Marland Kitchen atenolol (TENORMIN) 100 MG tablet TAKE 1 TABLET BY MOUTH TWICE DAILY 180 tablet 0  . butalbital-acetaminophen-caffeine (FIORICET, ESGIC) 50-325-40 MG per tablet Take 1 tablet by mouth 2 (two) times daily as needed. For migraine    . citalopram (CELEXA) 40 MG tablet Take 1 tablet (40 mg total) by mouth daily. 30 tablet 2  . Cyanocobalamin (B-12) 1000 MCG SUBL Place under the tongue.    . hydrochlorothiazide (HYDRODIURIL) 25 MG tablet TAKE 0.5 TABLETS (12.5 MG TOTAL) BY MOUTH DAILY. 30 tablet 0  . hydroxychloroquine (PLAQUENIL) 200 MG tablet Take 400 mg by mouth daily before breakfast.     . hyoscyamine (LEVSIN, ANASPAZ) 0.125 MG tablet Take 1 tablet (0.125 mg total)  by mouth every 4 (four) hours as needed. 30 tablet 0  . leflunomide (ARAVA) 10 MG tablet Take 10 mg by mouth daily before breakfast.     . losartan (COZAAR) 100 MG tablet Take 1 tablet (100 mg total) by mouth daily. 30 tablet 3  . ondansetron (ZOFRAN) 4 MG tablet Take 1 tablet (4 mg total) by mouth daily as needed for nausea or vomiting. 30 tablet 1  . pantoprazole (PROTONIX) 40 MG tablet TAKE 1 TABLET BY MOUTH EVERY DAY 30 tablet 5  . PREMPRO 0.45-1.5 MG per tablet TAKE 1 TABLET BY MOUTH EVERY DAY 28 tablet 2  . promethazine (PHENERGAN) 25 MG tablet Take 1 tablet (25 mg total) by mouth every 6 (six) hours as needed for nausea or vomiting. 30 tablet 0   No current facility-administered medications on  file prior to visit.    ROS: all negative except above.   Physical Exam: Filed Weights   07/29/14 1612  Weight: 196 lb (88.905 kg)   BP 138/92 mmHg  Pulse 76  Temp(Src) 97.7 F (36.5 C)  Resp 16  Ht 5\' 6"  (1.676 m)  Wt 196 lb (88.905 kg)  BMI 31.65 kg/m2 General Appearance: Well nourished, in no apparent distress. Eyes: PERRLA, EOMs, conjunctiva no swelling or erythema Sinuses: No Frontal/maxillary tenderness ENT/Mouth: Ext aud canals clear, TMs without erythema, bulging. No erythema, swelling, or exudate on post pharynx.  Tonsils not swollen or erythematous. Hearing normal.  Neck: Supple, thyroid normal.  Respiratory: Respiratory effort normal, BS equal bilaterally without rales, rhonchi, wheezing or stridor.  Cardio: RRR with no MRGs. Brisk peripheral pulses without edema.  Abdomen: distended and hard + BS.  + epigastric tenderness no guarding, rebound, hernias, masses. Lymphatics: Non tender without lymphadenopathy.  Musculoskeletal: Full ROM, 5/5 strength, normal gait.  Skin: Warm, dry without rashes, lesions, ecchymosis.  Neuro: Cranial nerves intact. Normal muscle tone, no cerebellar symptoms.  Psych: Awake and oriented X 3, normal affect, Insight and Judgment appropriate.     , PA-C 4:40 PM [facility]

## 2014-07-30 ENCOUNTER — Telehealth: Payer: Self-pay | Admitting: Physician Assistant

## 2014-07-30 ENCOUNTER — Encounter (HOSPITAL_COMMUNITY): Payer: Self-pay

## 2014-07-30 ENCOUNTER — Telehealth: Payer: Self-pay

## 2014-07-30 ENCOUNTER — Observation Stay (HOSPITAL_COMMUNITY)
Admission: EM | Admit: 2014-07-30 | Discharge: 2014-07-31 | Disposition: A | Discharge status: Home / Self Care | Payer: Managed Care, Other (non HMO) | Attending: Internal Medicine | Admitting: Internal Medicine

## 2014-07-30 ENCOUNTER — Other Ambulatory Visit: Payer: Self-pay | Admitting: Physician Assistant

## 2014-07-30 ENCOUNTER — Emergency Department (HOSPITAL_COMMUNITY): Payer: Managed Care, Other (non HMO)

## 2014-07-30 DIAGNOSIS — B009 Herpesviral infection, unspecified: Secondary | ICD-10-CM | POA: Insufficient documentation

## 2014-07-30 DIAGNOSIS — Z79890 Hormone replacement therapy (postmenopausal): Secondary | ICD-10-CM | POA: Insufficient documentation

## 2014-07-30 DIAGNOSIS — F329 Major depressive disorder, single episode, unspecified: Secondary | ICD-10-CM | POA: Insufficient documentation

## 2014-07-30 DIAGNOSIS — Z6829 Body mass index (BMI) 29.0-29.9, adult: Secondary | ICD-10-CM | POA: Diagnosis not present

## 2014-07-30 DIAGNOSIS — Z7982 Long term (current) use of aspirin: Secondary | ICD-10-CM | POA: Insufficient documentation

## 2014-07-30 DIAGNOSIS — F419 Anxiety disorder, unspecified: Secondary | ICD-10-CM | POA: Diagnosis not present

## 2014-07-30 DIAGNOSIS — R441 Visual hallucinations: Secondary | ICD-10-CM

## 2014-07-30 DIAGNOSIS — R443 Hallucinations, unspecified: Secondary | ICD-10-CM | POA: Diagnosis present

## 2014-07-30 DIAGNOSIS — K219 Gastro-esophageal reflux disease without esophagitis: Secondary | ICD-10-CM | POA: Insufficient documentation

## 2014-07-30 DIAGNOSIS — R7989 Other specified abnormal findings of blood chemistry: Secondary | ICD-10-CM

## 2014-07-30 DIAGNOSIS — Z791 Long term (current) use of non-steroidal anti-inflammatories (NSAID): Secondary | ICD-10-CM | POA: Diagnosis not present

## 2014-07-30 DIAGNOSIS — E876 Hypokalemia: Secondary | ICD-10-CM | POA: Diagnosis not present

## 2014-07-30 DIAGNOSIS — E669 Obesity, unspecified: Secondary | ICD-10-CM | POA: Insufficient documentation

## 2014-07-30 DIAGNOSIS — R109 Unspecified abdominal pain: Secondary | ICD-10-CM

## 2014-07-30 DIAGNOSIS — Z79899 Other long term (current) drug therapy: Secondary | ICD-10-CM | POA: Diagnosis not present

## 2014-07-30 DIAGNOSIS — M069 Rheumatoid arthritis, unspecified: Secondary | ICD-10-CM | POA: Insufficient documentation

## 2014-07-30 DIAGNOSIS — F1020 Alcohol dependence, uncomplicated: Secondary | ICD-10-CM | POA: Diagnosis not present

## 2014-07-30 DIAGNOSIS — E785 Hyperlipidemia, unspecified: Secondary | ICD-10-CM | POA: Diagnosis not present

## 2014-07-30 DIAGNOSIS — K7682 Hepatic encephalopathy: Secondary | ICD-10-CM | POA: Diagnosis present

## 2014-07-30 DIAGNOSIS — K7290 Hepatic failure, unspecified without coma: Secondary | ICD-10-CM | POA: Diagnosis not present

## 2014-07-30 DIAGNOSIS — R945 Abnormal results of liver function studies: Secondary | ICD-10-CM

## 2014-07-30 DIAGNOSIS — I10 Essential (primary) hypertension: Secondary | ICD-10-CM | POA: Diagnosis not present

## 2014-07-30 LAB — DRUG SCREEN, URINE
Amphetamine Screen, Ur: NEGATIVE
Barbiturate Quant, Ur: NEGATIVE
Benzodiazepines.: NEGATIVE
Cocaine Metabolites: NEGATIVE
Creatinine,U: 132.01 mg/dL
Marijuana Metabolite: NEGATIVE
Methadone: NEGATIVE
Opiates: NEGATIVE
Phencyclidine (PCP): NEGATIVE
Propoxyphene: NEGATIVE

## 2014-07-30 LAB — HEPATIC FUNCTION PANEL
ALBUMIN: 4 g/dL (ref 3.5–5.2)
ALK PHOS: 224 U/L — AB (ref 39–117)
ALT: 112 U/L — ABNORMAL HIGH (ref 0–35)
ALT: 106 U/L — AB (ref 0–35)
AST: 156 U/L — AB (ref 0–37)
AST: 144 U/L — AB (ref 0–37)
Albumin: 4.2 g/dL (ref 3.5–5.2)
Alkaline Phosphatase: 227 U/L — ABNORMAL HIGH (ref 39–117)
BILIRUBIN DIRECT: 0.8 mg/dL — AB (ref 0.0–0.5)
BILIRUBIN INDIRECT: 1.3 mg/dL — AB (ref 0.3–0.9)
Bilirubin, Direct: 1 mg/dL — ABNORMAL HIGH (ref 0.0–0.3)
Indirect Bilirubin: 1.1 mg/dL (ref 0.2–1.2)
TOTAL PROTEIN: 7.1 g/dL (ref 6.0–8.3)
Total Bilirubin: 2.1 mg/dL — ABNORMAL HIGH (ref 0.2–1.2)
Total Bilirubin: 2.1 mg/dL — ABNORMAL HIGH (ref 0.3–1.2)
Total Protein: 7.7 g/dL (ref 6.0–8.3)

## 2014-07-30 LAB — CBC WITH DIFFERENTIAL/PLATELET
Basophils Absolute: 0.1 10*3/uL (ref 0.0–0.1)
Basophils Relative: 1 % (ref 0–1)
EOS ABS: 0.1 10*3/uL (ref 0.0–0.7)
Eosinophils Relative: 3 % (ref 0–5)
HCT: 37.3 % (ref 36.0–46.0)
Hemoglobin: 12.5 g/dL (ref 12.0–15.0)
LYMPHS ABS: 0.8 10*3/uL (ref 0.7–4.0)
LYMPHS PCT: 21 % (ref 12–46)
MCH: 33.7 pg (ref 26.0–34.0)
MCHC: 33.5 g/dL (ref 30.0–36.0)
MCV: 100.5 fL — ABNORMAL HIGH (ref 78.0–100.0)
Monocytes Absolute: 0.9 10*3/uL (ref 0.1–1.0)
Monocytes Relative: 24 % — ABNORMAL HIGH (ref 3–12)
Neutro Abs: 1.9 10*3/uL (ref 1.7–7.7)
Neutrophils Relative %: 51 % (ref 43–77)
Platelets: 117 10*3/uL — ABNORMAL LOW (ref 150–400)
RBC: 3.71 MIL/uL — AB (ref 3.87–5.11)
RDW: 16.8 % — ABNORMAL HIGH (ref 11.5–15.5)
WBC: 3.7 10*3/uL — AB (ref 4.0–10.5)

## 2014-07-30 LAB — AMYLASE: Amylase: 11 U/L (ref 0–105)

## 2014-07-30 LAB — PROTIME-INR
INR: 1.02 (ref <1.50)
INR: 1.06 (ref 0.00–1.49)
Prothrombin Time: 13.4 seconds (ref 11.6–15.2)
Prothrombin Time: 13.9 seconds (ref 11.6–15.2)

## 2014-07-30 LAB — URINALYSIS, ROUTINE W REFLEX MICROSCOPIC
Bilirubin Urine: NEGATIVE
Glucose, UA: NEGATIVE mg/dL
Hgb urine dipstick: NEGATIVE
KETONES UR: NEGATIVE mg/dL
LEUKOCYTES UA: NEGATIVE
NITRITE: NEGATIVE
Protein, ur: NEGATIVE mg/dL
SPECIFIC GRAVITY, URINE: 1.009 (ref 1.005–1.030)
Urobilinogen, UA: 0.2 mg/dL (ref 0.0–1.0)
pH: 6.5 (ref 5.0–8.0)

## 2014-07-30 LAB — BASIC METABOLIC PANEL WITH GFR
BUN: 12 mg/dL (ref 6–23)
CALCIUM: 9.2 mg/dL (ref 8.4–10.5)
CO2: 19 mEq/L (ref 19–32)
CREATININE: 0.72 mg/dL (ref 0.50–1.10)
Chloride: 97 mEq/L (ref 96–112)
Glucose, Bld: 89 mg/dL (ref 70–99)
Potassium: 3.4 mEq/L — ABNORMAL LOW (ref 3.5–5.3)
Sodium: 133 mEq/L — ABNORMAL LOW (ref 135–145)

## 2014-07-30 LAB — RAPID URINE DRUG SCREEN, HOSP PERFORMED
AMPHETAMINES: NOT DETECTED
BENZODIAZEPINES: POSITIVE — AB
Barbiturates: NOT DETECTED
COCAINE: NOT DETECTED
OPIATES: NOT DETECTED
TETRAHYDROCANNABINOL: NOT DETECTED

## 2014-07-30 LAB — BASIC METABOLIC PANEL
ANION GAP: 11 (ref 5–15)
BUN: 15 mg/dL (ref 6–23)
CO2: 21 mmol/L (ref 19–32)
Calcium: 9.6 mg/dL (ref 8.4–10.5)
Chloride: 102 mmol/L (ref 96–112)
Creatinine, Ser: 0.79 mg/dL (ref 0.50–1.10)
GFR calc Af Amer: >90 mL/min (ref >90)
GFR calc non Af Amer: >90 mL/min (ref >90)
Glucose, Bld: 99 mg/dL (ref 70–99)
POTASSIUM: 3.2 mmol/L — AB (ref 3.5–5.1)
Sodium: 134 mmol/L — ABNORMAL LOW (ref 135–145)

## 2014-07-30 LAB — AMMONIA
AMMONIA: 81 umol/L — AB (ref 16–53)
Ammonia: 52 umol/L — ABNORMAL HIGH (ref 11–32)

## 2014-07-30 LAB — TSH: TSH: 1.421 u[IU]/mL (ref 0.350–4.500)

## 2014-07-30 LAB — LIPASE: LIPASE: 11 U/L (ref 0–75)

## 2014-07-30 LAB — ETHANOL: Alcohol, Ethyl (B): <10 mg/dL (ref 0–10)

## 2014-07-30 MED ORDER — PANTOPRAZOLE SODIUM 40 MG PO TBEC
40.0000 mg | DELAYED_RELEASE_TABLET | Freq: Every day | ORAL | Status: DC
  Administered 2014-07-31: 40 mg via ORAL
  Filled 2014-07-30 (×2): qty 1

## 2014-07-30 MED ORDER — ASPIRIN 81 MG PO CHEW
81.0000 mg | CHEWABLE_TABLET | Freq: Every day | ORAL | Status: DC
  Administered 2014-07-30 – 2014-07-31 (×2): 81 mg via ORAL
  Filled 2014-07-30 (×3): qty 1

## 2014-07-30 MED ORDER — DOCUSATE SODIUM 100 MG PO CAPS
100.0000 mg | ORAL_CAPSULE | Freq: Two times a day (BID) | ORAL | Status: DC
  Administered 2014-07-30: 100 mg via ORAL
  Filled 2014-07-30 (×5): qty 1

## 2014-07-30 MED ORDER — FENTANYL CITRATE 0.05 MG/ML IJ SOLN: 50.0000 ug | Freq: Once | INTRAMUSCULAR | Status: DC

## 2014-07-30 MED ORDER — ENOXAPARIN SODIUM 40 MG/0.4ML ~~LOC~~ SOLN
40.0000 mg | SUBCUTANEOUS | Status: DC
Start: 2014-07-30 — End: 2014-07-31
  Administered 2014-07-30: 40 mg via SUBCUTANEOUS
  Filled 2014-07-30 (×3): qty 0.4

## 2014-07-30 MED ORDER — CONJ ESTROG-MEDROXYPROGEST ACE 0.45-1.5 MG PO TABS
1.0000 | ORAL_TABLET | Freq: Every day | ORAL | Status: DC
Start: 2014-07-31 — End: 2014-07-31

## 2014-07-30 MED ORDER — FOLIC ACID 1 MG PO TABS
1.0000 mg | ORAL_TABLET | Freq: Every day | ORAL | Status: DC
  Administered 2014-07-30 – 2014-07-31 (×2): 1 mg via ORAL
  Filled 2014-07-30 (×3): qty 1

## 2014-07-30 MED ORDER — ONDANSETRON HCL 4 MG PO TABS: 4.0000 mg | ORAL_TABLET | Freq: Four times a day (QID) | ORAL | Status: DC | PRN

## 2014-07-30 MED ORDER — SODIUM CHLORIDE 0.9 % IJ SOLN
3.0000 mL | Freq: Two times a day (BID) | INTRAMUSCULAR | Status: DC
  Administered 2014-07-30 – 2014-07-31 (×2): 3 mL via INTRAVENOUS

## 2014-07-30 MED ORDER — POTASSIUM CHLORIDE CRYS ER 20 MEQ PO TBCR
40.0000 meq | EXTENDED_RELEASE_TABLET | Freq: Once | ORAL | Status: AC
  Administered 2014-07-30: 40 meq via ORAL
  Filled 2014-07-30: qty 2

## 2014-07-30 MED ORDER — SODIUM CHLORIDE 0.9 % IV BOLUS (SEPSIS)
1000.0000 mL | Freq: Once | INTRAVENOUS | Status: AC
  Administered 2014-07-30: 1000 mL via INTRAVENOUS

## 2014-07-30 MED ORDER — VITAMIN B-1 100 MG PO TABS
100.0000 mg | ORAL_TABLET | Freq: Every day | ORAL | Status: DC
  Administered 2014-07-30 – 2014-07-31 (×2): 100 mg via ORAL
  Filled 2014-07-30 (×3): qty 1

## 2014-07-30 MED ORDER — LACTULOSE 10 GM/15ML PO SOLN
20.0000 g | Freq: Two times a day (BID) | ORAL | Status: DC
  Administered 2014-07-30 – 2014-07-31 (×2): 20 g via ORAL
  Filled 2014-07-30 (×5): qty 30

## 2014-07-30 MED ORDER — SODIUM CHLORIDE 0.9 % IV SOLN
INTRAVENOUS | Status: AC
  Administered 2014-07-30: 1000 mL via INTRAVENOUS
  Administered 2014-07-31: 10:00:00 via INTRAVENOUS

## 2014-07-30 MED ORDER — LOSARTAN POTASSIUM 50 MG PO TABS
100.0000 mg | ORAL_TABLET | Freq: Every day | ORAL | Status: DC
Start: 2014-07-31 — End: 2014-07-31
  Administered 2014-07-31: 100 mg via ORAL
  Filled 2014-07-30 (×2): qty 2

## 2014-07-30 MED ORDER — LACTULOSE 10 GM/15ML PO SOLN
20.0000 g | Freq: Once | ORAL | Status: AC
  Administered 2014-07-30: 20 g via ORAL
  Filled 2014-07-30: qty 30

## 2014-07-30 MED ORDER — ATENOLOL 100 MG PO TABS
100.0000 mg | ORAL_TABLET | Freq: Two times a day (BID) | ORAL | Status: DC
  Administered 2014-07-30 – 2014-07-31 (×2): 100 mg via ORAL
  Filled 2014-07-30 (×5): qty 1

## 2014-07-30 MED ORDER — ONDANSETRON HCL 4 MG/2ML IJ SOLN: 4.0000 mg | Freq: Four times a day (QID) | INTRAMUSCULAR | Status: DC | PRN

## 2014-07-30 MED ORDER — CITALOPRAM HYDROBROMIDE 20 MG PO TABS
20.0000 mg | ORAL_TABLET | Freq: Every day | ORAL | Status: DC
  Administered 2014-07-31: 20 mg via ORAL
  Filled 2014-07-30 (×2): qty 1

## 2014-07-30 NOTE — ED Notes (Signed)
Pt aware urine sample needed. She reports having used the restroom prior to being asked for sample while in triage

## 2014-07-30 NOTE — ED Notes (Addendum)
Pt told to come to ED for abnormal labs.  Liver enzymes elevated.  BP also elevated in triage area.  Pt was having hallucinations yesterday also.  Pt has been seeing MD for 1 1/2 months d/t abdominal pain with n/v

## 2014-07-30 NOTE — ED Provider Notes (Signed)
CSN: [id number]     Arrival date & time 07/30/14  1603 History   First MD Initiated Contact with Patient 07/30/14 1655     Chief Complaint  Patient presents with  . Abnormal Lab     (Consider location/radiation/quality/duration/timing/severity/associated sxs/prior Treatment) The history is provided by the patient.  Michele Cooley is a 49 y.o. female hx of anxiety, rheumatoid arthritis, HTN, here presenting with abnormal labs, abdominal pain, visual hallucination. She isn't having intermittent epigastric pain that is worse with eating for the last several months. She is seen in the ED at [facility] recently and had elevated LFTs and was thought to be from alcoholic hepatitis. She states that she has not been drinking alcohol for the last month and a half. She had more tremors as well as having visual hallucinations since yesterday. She was seen people in the ER as well as ants crawling on her. Denies any auditory hallucinations or thoughts of harming herself. She went to see her primary care doctor yesterday and was noted to have elevated LFTs as well as ammonia 82. She is sent for possible hepatic encephalopathy. Of note she is currently on Plaquenil for rheumatoid arthritis.    Past Medical History  Diagnosis Date  . Neuromuscular disorder   . Leg pain, right   . Anxiety   . Rheumatoid arthritis involving multiple joints     rheumatoid arthritis- hands, degeneration of lumbar  spine   . GERD (gastroesophageal reflux disease)   . Mental disorder   . Eye irritation     R eye- redness- from removing contact lense  . Hypertension   . Hyperlipidemia   . Allergy   . Herpes simplex type II infection   . Obesity   . Depression   . Alcoholism    Past Surgical History  Procedure Laterality Date  . Lumbar laminectomy/decompression microdiscectomy  10/18/2011    Procedure: LUMBAR LAMINECTOMY/DECOMPRESSION MICRODISCECTOMY;  Surgeon: Karn Cassis, MD;  Location: [facility];  Service:  Neurosurgery;  Laterality: Right;  Redo Right Lumbar four-fivelaminectomy microdiskectomy  . Back surgery      09/2011, first back surgery- 2000    . Eye surgery      lasik procedure very long ago- not successful  . Cholecystectomy      09/2008  . Parotidectomy Left    Family History  Problem Relation Age of Onset  . Hypertension Mother   . Stroke Mother   . COPD Father   . Heart disease Father   . Heart disease Sister    History  Substance Use Topics  . Smoking status: Never Smoker   . Smokeless tobacco: Never Used  . Alcohol Use: 0.0 oz/week    0 Standard drinks or equivalent per week     Comment: + history of alcohol abuse and history of DTs with hallucinations.    OB History    No data available     Review of Systems  Gastrointestinal: Positive for abdominal pain.  All other systems reviewed and are negative.     Allergies  Codeine  Home Medications   Prior to Admission medications   Medication Sig Start Date End Date Taking? Authorizing Provider  acyclovir (ZOVIRAX) 400 MG tablet Take 1 tablet (400 mg total) by mouth 2 (two) times daily. 03/18/14  Yes Lucky Cowboy, MD  ALPRAZolam Prudy Feeler) 0.5 MG tablet TAKE 1 TABLET BY MOUTH 3 TIMES A DAY AS NEEDED Patient taking differently: TAKE 1 TABLET BY MOUTH 3 TIMES A DAY  AS NEEDED ANXIETY 05/25/14  Yes Lucky Cowboy, MD  aspirin 81 MG tablet Take 81 mg by mouth daily.   Yes Historical Provider, MD  atenolol (TENORMIN) 100 MG tablet TAKE 1 TABLET BY MOUTH TWICE DAILY 07/15/14  Yes Lucky Cowboy, MD  Cholecalciferol (VITAMIN D) 2000 UNITS CAPS Take 2,000 Units by mouth daily.   Yes Historical Provider, MD  citalopram (CELEXA) 40 MG tablet Take 1 tablet (40 mg total) by mouth daily. Patient taking differently: Take 20 mg by mouth daily.  05/12/14 05/12/15 Yes Quentin Mulling, PA-C  Cyanocobalamin (B-12) 1000 MCG SUBL Place 1,000 mcg under the tongue daily.    Yes Historical Provider, MD  dexlansoprazole (DEXILANT) 60 MG  capsule Take 60 mg by mouth daily.   Yes Historical Provider, MD  hydrochlorothiazide (HYDRODIURIL) 25 MG tablet TAKE 0.5 TABLETS (12.5 MG TOTAL) BY MOUTH DAILY. 06/09/14  Yes Lucky Cowboy, MD  hydroxychloroquine (PLAQUENIL) 200 MG tablet Take 400 mg by mouth daily before breakfast.    Yes Historical Provider, MD  hydroxypropyl methylcellulose / hypromellose (ISOPTO TEARS / GONIOVISC) 2.5 % ophthalmic solution Place 1 drop into both eyes 3 (three) times daily as needed for dry eyes.   Yes Historical Provider, MD  hyoscyamine (LEVSIN, ANASPAZ) 0.125 MG tablet Take 1 tablet (0.125 mg total) by mouth every 4 (four) hours as needed. Patient taking differently: Take 0.125 mg by mouth every 4 (four) hours as needed for bladder spasms.  07/28/14  Yes Quentin Mulling, PA-C  leflunomide (ARAVA) 10 MG tablet Take 10 mg by mouth daily before breakfast.    Yes Historical Provider, MD  losartan (COZAAR) 100 MG tablet Take 1 tablet (100 mg total) by mouth daily. 05/12/14 05/12/15 Yes Quentin Mulling, PA-C  naproxen sodium (ANAPROX) 220 MG tablet Take 440 mg by mouth 2 (two) times daily with a meal.   Yes Historical Provider, MD  pantoprazole (PROTONIX) 40 MG tablet TAKE 1 TABLET BY MOUTH EVERY DAY 05/25/14  Yes Lucky Cowboy, MD  PREMPRO 0.45-1.5 MG per tablet TAKE 1 TABLET BY MOUTH EVERY DAY 06/23/14  Yes Quentin Mulling, PA-C  promethazine (PHENERGAN) 25 MG tablet Take 1 tablet (25 mg total) by mouth every 6 (six) hours as needed for nausea or vomiting. 07/08/14  Yes Quentin Mulling, PA-C  ondansetron (ZOFRAN) 4 MG tablet Take 1 tablet (4 mg total) by mouth daily as needed for nausea or vomiting. Patient not taking: Reported on 07/30/2014 07/28/14 07/28/15  Quentin Mulling, PA-C   BP 184/109 mmHg  Pulse 71  Temp(Src) 98.6 F (37 C) (Oral)  Resp 18  SpO2 100%  LMP 06/14/2012 Physical Exam  Constitutional: She is oriented to person, place, and time.  Chronically ill, dehydrated   HENT:  Head: Normocephalic.  MM  slightly dry   Eyes: Conjunctivae are normal. Pupils are equal, round, and reactive to light.  Neck: Normal range of motion. Neck supple.  Cardiovascular: Normal rate, regular rhythm and normal heart sounds.   Pulmonary/Chest: Effort normal and breath sounds normal. No respiratory distress. She has no wheezes. She has no rales.  Abdominal: Soft. Bowel sounds are normal.  + epigastric and RUQ tenderness, no rebound   Musculoskeletal: Normal range of motion. She exhibits no edema or tenderness.  Neurological: She is alert and oriented to person, place, and time. No cranial nerve deficit. Coordination normal.  No obvious ataxia, or asterixis. Mild tremors   Skin: Skin is warm and dry.  Psychiatric: She has a normal mood and affect. Her behavior is  normal. Judgment and thought content normal.  Nursing note and vitals reviewed.   ED Course  Procedures (including critical care time) Labs Review Labs Reviewed  CBC WITH DIFFERENTIAL/PLATELET - Abnormal; Notable for the following:    WBC 3.7 (*)    RBC 3.71 (*)    MCV 100.5 (*)    RDW 16.8 (*)    Platelets 117 (*)    Monocytes Relative 24 (*)    All other components within normal limits  BASIC METABOLIC PANEL - Abnormal; Notable for the following:    Sodium 134 (*)    Potassium 3.2 (*)    All other components within normal limits  HEPATIC FUNCTION PANEL - Abnormal; Notable for the following:    AST 144 (*)    ALT 106 (*)    Alkaline Phosphatase 227 (*)    Total Bilirubin 2.1 (*)    Bilirubin, Direct 0.8 (*)    Indirect Bilirubin 1.3 (*)    All other components within normal limits  AMMONIA - Abnormal; Notable for the following:    Ammonia 52 (*)    All other components within normal limits  URINE RAPID DRUG SCREEN (HOSP PERFORMED)    Imaging Review US Abdomen Complete  07/30/2014   CLINICAL DATA:  Elevated liver function tests. Right upper quadrant pain. Symptoms for 1-1/2 months.  EXAM: ULTRASOUND ABDOMEN COMPLETE  COMPARISON:   Abdominal ultrasound 08/29/2008.  FINDINGS: Gallbladder: Removed.  Common bile duct: Diameter: 0.5 cm  Liver: No focal liver lesion is identified. The liver demonstrates increased echogenicity and coarsened echotexture. No intrahepatic biliary ductal dilatation is seen.  IVC: No abnormality visualized.  Pancreas: Visualized portion unremarkable.  Spleen: Size and appearance within normal limits.  Right Kidney: Length: 11.2 cm. Echogenicity within normal limits. No mass or hydronephrosis visualized.  Left Kidney: Length: 11.3 cm. Echogenicity within normal limits. No mass or hydronephrosis visualized.  Abdominal aorta: Not visualized.  Other findings: None.  IMPRESSION: No acute abnormality.  Fatty infiltration of the liver.  Status post cholecystectomy.   Electronically Signed   By: Drusilla Kanner M.D.   On: 07/30/2014 19:05     EKG Interpretation None      MDM   Final diagnoses:  Abdominal pain  Elevated LFTs    DAISHA FILOSA is a 49 y.o. female here with visual hallucinations, elevated LFTs and ab pain. Concerned for possible hepatic encephalopathy. Had neg ETOH and nl INR and lipase yesterday. Denies alcohol use. Will get RUQ [place] and repeat LFTs and ammonia. Likely will admit.   7:19 PM US showed fatty liver. LFTs elevated. Ammonia 52. Will admit for further workup.   Richardean Canal, MD 07/30/14 [phone]

## 2014-07-30 NOTE — Telephone Encounter (Signed)
Per request from [place] I called patient to express that Marchelle Folks does want her to go the the ER today and not to wait for another episode of hallucinations, I stressed the importance to her and her response was "I'll try" and "I'll do my best" Marchelle Folks was advised.

## 2014-07-30 NOTE — ED Notes (Signed)
Patient transported to CT 

## 2014-07-30 NOTE — Telephone Encounter (Signed)
48 year WF with history of choley, RA, HTN, noncompliance, preDM, chol, has had a complicated, prolonged course since Dec of HTN, flushing, tremors, elevated LFTs, with epigastric pain, nausea and vomiting. She was on RA medications that was discontinued, put on liquid diet and improved however with developing hallucinations and LFTs she was sent to ER, went to [facility] on 02/12. There they thought it could have been alcoholic hepatitis with withdrawals and she was dismissed. She presented to the office yesterday with continuing hallucinations, claiming she has not had any alcohol in 1.5 months, and continuing nausea, diarrhea, and epigastric pain. Labs were drawn that showed no alcohol, neg urine drug screen and worsen LFTs with elevated ammonia, hypokalemia and hyponatremia, normal albumin/INR. Due to possibility of liver failure and hallucination, sent to the ER, she is not driving. Her husband called and was rather forceful and aggressive, insisting that I take her to the ER or skip the ER and admit her. I explained several times that we do not have admitting privileges but that the ER can see my notes and labs and will give her the best care possible. He finally said he would take her. I have called the ER and spoke with Hen the charge nurse at 3:20.

## 2014-07-30 NOTE — H&P (Signed)
PCP: Nadean Corwin, MD    Chief Complaint:  hallucinations  HPI: Michele Cooley is a 49 y.o. female   has a past medical history of Neuromuscular disorder; Leg pain, right; Anxiety; Rheumatoid arthritis involving multiple joints; GERD (gastroesophageal reflux disease); Mental disorder; Eye irritation; Hypertension; Hyperlipidemia; Allergy; Herpes simplex type II infection; Obesity; Depression; and Alcoholism.   Presented with  4 days ago patient developed nausea and vomiting and diarrhea. She has occasional episodes of that. Patient has hx of liver disease possible due to hx of alcoholism. Patient has rheumatoid arthritis and has been followed for her labs with  Rheumatology. Patient states she presented to her PCP to get her labs checked routinely and was noted to have elevated a morning level up to 100. She has been noticing some confusion past 2 days and yesterday started to have visual hallucinations including seeing people in the yard and seen and scrolling on the floor and herself. Consequently she recently had episodes of nausea vomiting and diarrhea which she gets on occasion. She is currently stating she feels back to her normal self nausea vomiting diarrhea has resolved. Patient does not appear to be confused. The emergency department ammonia levels noted to be down to 52. Patient denies any change in her medications she does report that recently she hasn't been sleeping much Hospitalist was called for admission for hepatic encephalopathy  Review of Systems:    Pertinent positives include:  abdominal pain, nausea, vomiting, diarrhea confusion and hallucinations   Constitutional:  No weight loss, night sweats, Fevers, chills, fatigue, weight loss  HEENT:  No headaches, Difficulty swallowing,Tooth/dental problems,Sore throat,  No sneezing, itching, ear ache, nasal congestion, post nasal drip,  Cardio-vascular:  No chest pain, Orthopnea, PND, anasarca, dizziness,  palpitations.no Bilateral lower extremity swelling  GI:  No heartburn, indigestion, , change in bowel habits, loss of appetite, melena, blood in stool, hematemesis Resp:  no shortness of breath at rest. No dyspnea on exertion, No excess mucus, no productive cough, No non-productive cough, No coughing up of blood.No change in color of mucus.No wheezing. Skin:  no rash or lesions. No jaundice GU:  no dysuria, change in color of urine, no urgency or frequency. No straining to urinate.  No flank pain.  Musculoskeletal:  No joint pain or no joint swelling. No decreased range of motion. No back pain.  Psych:  No change in mood or affect. No depression or anxiety. No memory loss.  Neuro: no localizing neurological complaints, no tingling, no weakness, no double vision, no gait abnormality, no slurred speech, no confusion  Otherwise ROS are negative except for above, 10 systems were reviewed  Past Medical History: Past Medical History  Diagnosis Date  . Neuromuscular disorder   . Leg pain, right   . Anxiety   . Rheumatoid arthritis involving multiple joints     rheumatoid arthritis- hands, degeneration of lumbar  spine   . GERD (gastroesophageal reflux disease)   . Mental disorder   . Eye irritation     R eye- redness- from removing contact lense  . Hypertension   . Hyperlipidemia   . Allergy   . Herpes simplex type II infection   . Obesity   . Depression   . Alcoholism    Past Surgical History  Procedure Laterality Date  . Lumbar laminectomy/decompression microdiscectomy  10/18/2011    Procedure: LUMBAR LAMINECTOMY/DECOMPRESSION MICRODISCECTOMY;  Surgeon: Karn Cassis, MD;  Location: [facility];  Service: Neurosurgery;  Laterality: Right;  Redo  Right Lumbar four-fivelaminectomy microdiskectomy  . Back surgery      09/2011, first back surgery- 2000    . Eye surgery      lasik procedure very long ago- not successful  . Cholecystectomy      09/2008  . Parotidectomy Left       Medications: Prior to Admission medications   Medication Sig Start Date End Date Taking? Authorizing Provider  acyclovir (ZOVIRAX) 400 MG tablet Take 1 tablet (400 mg total) by mouth 2 (two) times daily. 03/18/14  Yes Lucky Cowboy, MD  ALPRAZolam Prudy Feeler) 0.5 MG tablet TAKE 1 TABLET BY MOUTH 3 TIMES A DAY AS NEEDED Patient taking differently: TAKE 1 TABLET BY MOUTH 3 TIMES A DAY AS NEEDED ANXIETY 05/25/14  Yes Lucky Cowboy, MD  aspirin 81 MG tablet Take 81 mg by mouth daily.   Yes Historical Provider, MD  atenolol (TENORMIN) 100 MG tablet TAKE 1 TABLET BY MOUTH TWICE DAILY 07/15/14  Yes Lucky Cowboy, MD  Cholecalciferol (VITAMIN D) 2000 UNITS CAPS Take 2,000 Units by mouth daily.   Yes Historical Provider, MD  citalopram (CELEXA) 40 MG tablet Take 1 tablet (40 mg total) by mouth daily. Patient taking differently: Take 20 mg by mouth daily.  05/12/14 05/12/15 Yes Quentin Mulling, PA-C  Cyanocobalamin (B-12) 1000 MCG SUBL Place 1,000 mcg under the tongue daily.    Yes Historical Provider, MD  dexlansoprazole (DEXILANT) 60 MG capsule Take 60 mg by mouth daily.   Yes Historical Provider, MD  hydrochlorothiazide (HYDRODIURIL) 25 MG tablet TAKE 0.5 TABLETS (12.5 MG TOTAL) BY MOUTH DAILY. 06/09/14  Yes Lucky Cowboy, MD  hydroxychloroquine (PLAQUENIL) 200 MG tablet Take 400 mg by mouth daily before breakfast.    Yes Historical Provider, MD  hydroxypropyl methylcellulose / hypromellose (ISOPTO TEARS / GONIOVISC) 2.5 % ophthalmic solution Place 1 drop into both eyes 3 (three) times daily as needed for dry eyes.   Yes Historical Provider, MD  hyoscyamine (LEVSIN, ANASPAZ) 0.125 MG tablet Take 1 tablet (0.125 mg total) by mouth every 4 (four) hours as needed. Patient taking differently: Take 0.125 mg by mouth every 4 (four) hours as needed for bladder spasms.  07/28/14  Yes Quentin Mulling, PA-C  leflunomide (ARAVA) 10 MG tablet Take 10 mg by mouth daily before breakfast.    Yes Historical  Provider, MD  losartan (COZAAR) 100 MG tablet Take 1 tablet (100 mg total) by mouth daily. 05/12/14 05/12/15 Yes Quentin Mulling, PA-C  naproxen sodium (ANAPROX) 220 MG tablet Take 440 mg by mouth 2 (two) times daily with a meal.   Yes Historical Provider, MD  pantoprazole (PROTONIX) 40 MG tablet TAKE 1 TABLET BY MOUTH EVERY DAY 05/25/14  Yes Lucky Cowboy, MD  PREMPRO 0.45-1.5 MG per tablet TAKE 1 TABLET BY MOUTH EVERY DAY 06/23/14  Yes Quentin Mulling, PA-C  promethazine (PHENERGAN) 25 MG tablet Take 1 tablet (25 mg total) by mouth every 6 (six) hours as needed for nausea or vomiting. 07/08/14  Yes Quentin Mulling, PA-C  ondansetron (ZOFRAN) 4 MG tablet Take 1 tablet (4 mg total) by mouth daily as needed for nausea or vomiting. Patient not taking: Reported on 07/30/2014 07/28/14 07/28/15  Quentin Mulling, PA-C    Allergies:   Allergies  Allergen Reactions  . Codeine Itching    Social History:  Ambulatory  Independently Lives at home  With family     reports that she has never smoked. She has never used smokeless tobacco. She reports that she drinks alcohol. She reports  that she does not use illicit drugs.    Family History: family history includes COPD in her father; Heart disease in her father and sister; Hypertension in her mother; Stroke in her mother.    Physical Exam: Patient Vitals for the past 24 hrs:  BP Temp Temp src Pulse Resp SpO2  07/30/14 1924 180/100 mmHg - - - - -  07/30/14 1920 181/100 mmHg - - 62 22 100 %  07/30/14 1620 (!) 184/109 mmHg 98.6 F (37 C) Oral 71 18 100 %    1. General:  in No Acute distress 2. Psychological: Alert and  Oriented 3. Head/ENT:   Moist  Mucous Membranes                          Head Non traumatic, neck supple                          Normal  Dentition 4. SKIN:   decreased Skin turgor,  Skin clean Dry and intact no rash 5. Heart: Regular rate and rhythm no Murmur, Rub or gallop 6. Lungs: Clear to auscultation bilaterally, no wheezes or  crackles   7. Abdomen: Soft, non-tender, Non distended 8. Lower extremities: no clubbing, cyanosis, or edema 9. Neurologically Grossly intact, moving all 4 extremities equally no asterixis  10. MSK: Normal range of motion  body mass index is unknown because there is no weight on file.   Labs on Admission:   Results for orders placed or performed during the hospital encounter of 07/30/14 (from the past 24 hour(s))  CBC with Differential     Status: Abnormal   Collection Time: 07/30/14  5:14 PM  Result Value Ref Range   WBC 3.7 (L) 4.0 - 10.5 K/uL   RBC 3.71 (L) 3.87 - 5.11 MIL/uL   Hemoglobin 12.5 12.0 - 15.0 g/dL   HCT 92.4 26.8 - 34.1 %   MCV 100.5 (H) 78.0 - 100.0 fL   MCH 33.7 26.0 - 34.0 pg   MCHC 33.5 30.0 - 36.0 g/dL   RDW 96.2 (H) 22.9 - 79.8 %   Platelets 117 (L) 150 - 400 K/uL   Neutrophils Relative % 51 43 - 77 %   Neutro Abs 1.9 1.7 - 7.7 K/uL   Lymphocytes Relative 21 12 - 46 %   Lymphs Abs 0.8 0.7 - 4.0 K/uL   Monocytes Relative 24 (H) 3 - 12 %   Monocytes Absolute 0.9 0.1 - 1.0 K/uL   Eosinophils Relative 3 0 - 5 %   Eosinophils Absolute 0.1 0.0 - 0.7 K/uL   Basophils Relative 1 0 - 1 %   Basophils Absolute 0.1 0.0 - 0.1 K/uL  Basic metabolic panel     Status: Abnormal   Collection Time: 07/30/14  5:14 PM  Result Value Ref Range   Sodium 134 (L) 135 - 145 mmol/L   Potassium 3.2 (L) 3.5 - 5.1 mmol/L   Chloride 102 96 - 112 mmol/L   CO2 21 19 - 32 mmol/L   Glucose, Bld 99 70 - 99 mg/dL   BUN 15 6 - 23 mg/dL   Creatinine, Ser 9.21 0.50 - 1.10 mg/dL   Calcium 9.6 8.4 - 19.4 mg/dL   GFR calc non Af Amer >90 >90 mL/min   GFR calc Af Amer >90 >90 mL/min   Anion gap 11 5 - 15  Hepatic function panel     Status: Abnormal  Collection Time: 07/30/14  5:14 PM  Result Value Ref Range   Total Protein 7.7 6.0 - 8.3 g/dL   Albumin 4.2 3.5 - 5.2 g/dL   AST 270 (H) 0 - 37 U/L   ALT 106 (H) 0 - 35 U/L   Alkaline Phosphatase 227 (H) 39 - 117 U/L   Total Bilirubin  2.1 (H) 0.3 - 1.2 mg/dL   Bilirubin, Direct 0.8 (H) 0.0 - 0.5 mg/dL   Indirect Bilirubin 1.3 (H) 0.3 - 0.9 mg/dL  Ammonia     Status: Abnormal   Collection Time: 07/30/14  5:15 PM  Result Value Ref Range   Ammonia 52 (H) 11 - 32 umol/L    UANo evidence of UTI  Lab Results  Component Value Date   HGBA1C 5.3 08/21/2013    Estimated Creatinine Clearance: 94.4 mL/min (by C-G formula based on Cr of 0.79).  BNP (last 3 results) No results for input(s): PROBNP in the last 8760 hours.  Other results:  I have pearsonaly reviewed this: ECG not obtained   There were no vitals filed for this visit.   Cultures: No results found for: SDES, SPECREQUEST, CULT, REPTSTATUS   Radiological Exams on Admission: Ct Head Wo Contrast  07/30/2014   CLINICAL DATA:  Altered mental status and hallucinations today.  EXAM: CT HEAD WITHOUT CONTRAST  TECHNIQUE: Contiguous axial images were obtained from the base of the skull through the vertex without intravenous contrast.  COMPARISON:  None.  FINDINGS: There is no evidence of acute intracranial abnormality including hemorrhage, infarct, mass lesion, mass effect, midline shift or abnormal extra-axial fluid collection. No hydrocephalus or pneumocephalus. The calvarium is intact. Imaged paranasal sinuses and mastoid air cells are clear.  IMPRESSION: Negative head CT.   Electronically Signed   By: Drusilla Kanner M.D.   On: 07/30/2014 20:18   US Abdomen Complete  07/30/2014   CLINICAL DATA:  Elevated liver function tests. Right upper quadrant pain. Symptoms for 1-1/2 months.  EXAM: ULTRASOUND ABDOMEN COMPLETE  COMPARISON:  Abdominal ultrasound 08/29/2008.  FINDINGS: Gallbladder: Removed.  Common bile duct: Diameter: 0.5 cm  Liver: No focal liver lesion is identified. The liver demonstrates increased echogenicity and coarsened echotexture. No intrahepatic biliary ductal dilatation is seen.  IVC: No abnormality visualized.  Pancreas: Visualized portion unremarkable.   Spleen: Size and appearance within normal limits.  Right Kidney: Length: 11.2 cm. Echogenicity within normal limits. No mass or hydronephrosis visualized.  Left Kidney: Length: 11.3 cm. Echogenicity within normal limits. No mass or hydronephrosis visualized.  Abdominal aorta: Not visualized.  Other findings: None.  IMPRESSION: No acute abnormality.  Fatty infiltration of the liver.  Status post cholecystectomy.   Electronically Signed   By: Drusilla Kanner M.D.   On: 07/30/2014 19:05    Chart has been reviewed  Assessment/Plan   49 year old female history of liver disease secondary to alcohol resolved. Presents with elevated ammonia level a.m. and visual hallucinations some confusion is currently improving  Present on Admission:  . Hepatic encephalopathy - currently improving we'll administer IV fluids start lactulose and monitor admit to observation  . Increased ammonia level currently improving continue to monitor  . Hallucinations - possibly separate secondary to hepatic encephalopathy no new medications. CT of the head unremarkable  . Rheumatoid arthritis involving multiple joints plaque were no has been on hold all continue to hold  . Hypertension continue albuterol and Cozaar  . Nausea and vomiting currently resolved   hypokalemia will replace    Prophylaxis:  Lovenox,  Protonix  CODE STATUS:  FULL CODE    Other plan as per orders.  I have spent a total of  55 min on this admission  Michele Cooley 07/30/2014, 8:40 PM  [facility]  Pager [phone]   after 2 AM please page floor coverage PA If 7AM-7PM, please contact the day team taking care of the patient  [email or web address]  Password [email or web address]

## 2014-07-30 NOTE — ED Notes (Signed)
Pt was unable to give a urine specimen at this time. Pt stated she will call when she can.

## 2014-07-31 ENCOUNTER — Encounter: Payer: Self-pay | Admitting: Physician Assistant

## 2014-07-31 ENCOUNTER — Other Ambulatory Visit: Payer: Self-pay | Admitting: Physician Assistant

## 2014-07-31 DIAGNOSIS — F419 Anxiety disorder, unspecified: Secondary | ICD-10-CM

## 2014-07-31 LAB — HEPATITIS PANEL, ACUTE
HCV Ab: NEGATIVE
HEP A IGM: NONREACTIVE
HEP B C IGM: NONREACTIVE
Hepatitis B Surface Ag: NEGATIVE

## 2014-07-31 LAB — COMPREHENSIVE METABOLIC PANEL
ALBUMIN: 3.4 g/dL — AB (ref 3.5–5.2)
ALT: 81 U/L — AB (ref 0–35)
AST: 113 U/L — ABNORMAL HIGH (ref 0–37)
Alkaline Phosphatase: 172 U/L — ABNORMAL HIGH (ref 39–117)
Anion gap: 8 (ref 5–15)
BUN: 11 mg/dL (ref 6–23)
CALCIUM: 8.5 mg/dL (ref 8.4–10.5)
CO2: 23 mmol/L (ref 19–32)
Chloride: 105 mmol/L (ref 96–112)
Creatinine, Ser: 0.69 mg/dL (ref 0.50–1.10)
GFR calc non Af Amer: >90 mL/min (ref >90)
Glucose, Bld: 103 mg/dL — ABNORMAL HIGH (ref 70–99)
Potassium: 3.3 mmol/L — ABNORMAL LOW (ref 3.5–5.1)
Sodium: 136 mmol/L (ref 135–145)
TOTAL PROTEIN: 6.4 g/dL (ref 6.0–8.3)
Total Bilirubin: 2 mg/dL — ABNORMAL HIGH (ref 0.3–1.2)

## 2014-07-31 LAB — CBC
HCT: 32.6 % — ABNORMAL LOW (ref 36.0–46.0)
Hemoglobin: 10.7 g/dL — ABNORMAL LOW (ref 12.0–15.0)
MCH: 33.3 pg (ref 26.0–34.0)
MCHC: 32.8 g/dL (ref 30.0–36.0)
MCV: 101.6 fL — ABNORMAL HIGH (ref 78.0–100.0)
Platelets: 105 10*3/uL — ABNORMAL LOW (ref 150–400)
RBC: 3.21 MIL/uL — ABNORMAL LOW (ref 3.87–5.11)
RDW: 16.9 % — AB (ref 11.5–15.5)
WBC: 3.2 10*3/uL — AB (ref 4.0–10.5)

## 2014-07-31 LAB — MAGNESIUM: Magnesium: 1.6 mg/dL (ref 1.5–2.5)

## 2014-07-31 LAB — FERRITIN: Ferritin: 221 ng/mL (ref 10–291)

## 2014-07-31 LAB — CLOSTRIDIUM DIFFICILE BY PCR: CDIFFPCR: NEGATIVE

## 2014-07-31 LAB — AMMONIA: Ammonia: 44 umol/L — ABNORMAL HIGH (ref 11–32)

## 2014-07-31 LAB — TSH: TSH: 1.134 u[IU]/mL (ref 0.350–4.500)

## 2014-07-31 LAB — PHOSPHORUS: Phosphorus: 3.7 mg/dL (ref 2.3–4.6)

## 2014-07-31 MED ORDER — CLONIDINE HCL 0.1 MG PO TABS
0.1000 mg | ORAL_TABLET | Freq: Two times a day (BID) | ORAL | Status: DC
  Administered 2014-07-31: 0.1 mg via ORAL
  Filled 2014-07-31 (×2): qty 1

## 2014-07-31 MED ORDER — CLONIDINE HCL 0.1 MG PO TABS: 0.1000 mg | ORAL_TABLET | Freq: Two times a day (BID) | ORAL | Status: DC

## 2014-07-31 MED ORDER — ALPRAZOLAM 0.5 MG PO TABS: ORAL_TABLET | ORAL | Status: DC

## 2014-07-31 MED ORDER — HYDRALAZINE HCL 20 MG/ML IJ SOLN
INTRAMUSCULAR | Status: AC
  Filled 2014-07-31: qty 1

## 2014-07-31 MED ORDER — HYDRALAZINE HCL 20 MG/ML IJ SOLN
10.0000 mg | Freq: Once | INTRAMUSCULAR | Status: AC
  Administered 2014-07-31: 10 mg via INTRAVENOUS
  Filled 2014-07-31: qty 1

## 2014-07-31 MED ORDER — LACTULOSE 10 GM/15ML PO SOLN: 20.0000 g | Freq: Two times a day (BID) | ORAL | Status: DC

## 2014-07-31 NOTE — Progress Notes (Signed)
Discharge instructions given to pt with all questions answered.  

## 2014-07-31 NOTE — Discharge Summary (Signed)
Physician Discharge Summary  TONE MILLAGE ZSW:[id number] DOB: Nov 28, 1965 DOA: 07/30/2014  PCP: Nadean Corwin, MD  Admit date: 07/30/2014 Discharge date: 07/31/2014  Time spent: 50 minutes  Recommendations for Outpatient Follow-up:  1. Please follow-up on blood pressures, she was hypertensive during this hospitalization as clonidine 0.1 mg by mouth twice Michele day was added to her regimen. She was instructed to check her blood pressures daily and report to her primary care provider.  2. Please follow-up on hepatitis panel, ferritin level, ceruloplasmin level, alpha-1 antitrypsin, antinuclear antibodies, anti-smooth muscle antibodies. I spoke with GI recommended checking these labs prior to discharge. Patient instructed to follow-up with [facility] in 2 weeks to go over results. 3. Follow-up on Michele copy his metabolic panel and ammonia level on hospital follow-up  Discharge Diagnoses:  Active Problems:   Hypertension   Rheumatoid arthritis involving multiple joints   Increased ammonia level   Hallucinations   Nausea and vomiting   Hepatic encephalopathy   Elevated LFTs   Hallucination, visual   Discharge Condition: Stable  History of present illness:  Michele Cooley is Michele 49 year old Cooley with Michele past medical history of rheumatoid arthritis, anxiety, HTN, hyperlipidemia, obesity, depression, and alcoholism who presented to the [facility] on 07/30/14 after receiving an abnormal lab value from her PCP. She reports visual hallucinations in the form of seeing people who were not really there and increased confusion over the last 2 days. She also reports intermittent episodes of nausea, vomiting, and diarrhea over the past several months. Denies alcohol use, with last intake more than Michele month ago.   In the ED, patient was found to have an elevated ammonia level of 52 (previously 81 at PCP). LFTs were also elevated with AST 144 and ALT 106. Patient was hypokalemic with K+ of 3.2. Coagulation studies were  normal with PT 13.9 and INR 1.06. TSH was normal at 1.134. Hepatitis panel from 07/10/14 was negative. CT head was negative. Abdominal ultrasound showed fatty infiltration of the liver. Patient was hypertensive with Michele BP of 184/109. Started on lactulose.   At the time of discharge Michele Cooley is feeling much better and is no longer experiencing confusion/ hallucinations. She is anxious to go home. She reports 4 normal bowel movements since last night. Denies nausea, vomiting, diarrhea, abdominal pain, headache, cough, chest pain, or generalized weakness. Her labs have improved and at the time of discharge her ammonia is 44, AST 113, ALT 81. Patient's potasium has improved and is 3.3 upon discharge. She is hypertensive with BP of 177/108 and was given clonidine prior to discharge. Admits to medication noncompliance because of the schedule but states she routinely takes BP medication.   Hospital Course:   Hepatic encephalopathy -Patient presented with history of confusion and hallucinations -Upon admission had elevated ammonia at 82, improving to 44 after the administration of lactulose  -Currently back to baseline and feeling better -She had abdominal ultrasound performed during this hospitalization which showed fatty infiltration of liver. She reported having her last alcoholic drink about Michele month and half ago. Possibilities include alcoholic liver disease, nonalcoholic steatohepatitis, along with infectious and autoimmune causes.  -Case was discussed with Dr. Ewing Schlein of [facility], who recommended checking alpha-1 antitrypsin level, antinuclear antibodies, anti-smooth muscle anti-bodies, hepatitis panel, ferritin level, serial plasmin level prior to discharge. Patient to follow-up with [facility] in 2 weeks to go over results. -She was discharged on lactulose 20 g by mouth twice Michele day    Hypertension: -Patient  hypertensive during this hospitalization -Will discharge her on atenolol 100mg  and losartan 100mg ,  added clonidine 0.1 mg by mouth twice Michele day -Please follow-up on her blood pressures on hospital follow-up visit. She was instructed to check her blood pressures at home and report these to her primary care provider.    Rheumatoid arthritis involving multiple joints -Patient taking Plaquenil at home though reports not having taken it for Michele month -Currently pain free    Hallucinations/acute encephalopathy -Likely secondary to hepatic encephalopathy  -CT head negative -Improved with the administration of lactulose, ammonia levels trending down to 44 on day of discharge.    Nausea and vomiting -Patient is currently symptom free    Elevated LFTs - Patient presented with AST 144 and ALT 106 -Has been trending down and today AST 113 and ALT 81 -Abdominal ultrasound showing fatty infiltration of liver. Patient to follow-up with [facility] in 2 weeks  Procedures:  None  Consultations:  Telephone consultation to Gastroenterology   Discharge Exam: Filed Vitals:   07/30/14 2049 07/30/14 2200 07/31/14 0545 07/31/14 0928  BP: 165/94 169/111 167/101 177/108  Pulse: 71 69 68 69  Temp:   98 F (36.7 C) 98.8 F (37.1 C)  TempSrc:   Oral Oral  Resp: 16 15 18 18   Height:  5\' 9"  (1.753 m)    Weight:  89.359 kg (197 lb)    SpO2: 96% 98% 98% 98%   Filed Weights   07/30/14 2200  Weight: 89.359 kg (197 lb)     General: Well developed, overweight, NAD, appears stated age Cardiovascular: RRR, S1 and S2, no murmur, gallop, or rub Respiratory: Clear to auscultation bilaterally Abdominal: Soft, mild RUQ tenderness with palpation Extremities: Warm and dry, strength 5/5 UE/LE bilaterally, no evidence of cyanosis, clubbing, or edema  Discharge Instructions  Diet recommendation: Heart healthy- low sodium    Current Discharge Medication List    CONTINUE these medications which have NOT CHANGED   Details  acyclovir (ZOVIRAX) 400 MG tablet Take 1 tablet (400 mg total) by mouth 2 (two) times  daily. Qty: 60 tablet, Refills: 11    ALPRAZolam (XANAX) 0.5 MG tablet TAKE 1 TABLET BY MOUTH 3 TIMES Michele DAY AS NEEDED Qty: 90 tablet, Refills: 1   Associated Diagnoses: Anxiety disorder, unspecified anxiety disorder type    aspirin 81 MG tablet Take 81 mg by mouth daily.    atenolol (TENORMIN) 100 MG tablet TAKE 1 TABLET BY MOUTH TWICE DAILY Qty: 180 tablet, Refills: 0    Cholecalciferol (VITAMIN D) 2000 UNITS CAPS Take 2,000 Units by mouth daily.    citalopram (CELEXA) 40 MG tablet Take 1 tablet (40 mg total) by mouth daily. Qty: 30 tablet, Refills: 2   Associated Diagnoses: Hot flashes    Cyanocobalamin (B-12) 1000 MCG SUBL Place 1,000 mcg under the tongue daily.     dexlansoprazole (DEXILANT) 60 MG capsule Take 60 mg by mouth daily.    hydrochlorothiazide (HYDRODIURIL) 25 MG tablet TAKE 0.5 TABLETS (12.5 MG TOTAL) BY MOUTH DAILY. Qty: 30 tablet, Refills: 0    hydroxychloroquine (PLAQUENIL) 200 MG tablet Take 400 mg by mouth daily before breakfast.     hydroxypropyl methylcellulose / hypromellose (ISOPTO TEARS / GONIOVISC) 2.5 % ophthalmic solution Place 1 drop into both eyes 3 (three) times daily as needed for dry eyes.    hyoscyamine (LEVSIN, ANASPAZ) 0.125 MG tablet Take 1 tablet (0.125 mg total) by mouth every 4 (four) hours as needed. Qty: 30 tablet, Refills: 0  leflunomide (ARAVA) 10 MG tablet Take 10 mg by mouth daily before breakfast.     losartan (COZAAR) 100 MG tablet Take 1 tablet (100 mg total) by mouth daily. Qty: 30 tablet, Refills: 3   Associated Diagnoses: Essential hypertension    naproxen sodium (ANAPROX) 220 MG tablet Take 440 mg by mouth 2 (two) times daily with Michele meal.    pantoprazole (PROTONIX) 40 MG tablet TAKE 1 TABLET BY MOUTH EVERY DAY Qty: 30 tablet, Refills: 5   Associated Diagnoses: Gastroesophageal reflux disease, esophagitis presence not specified    PREMPRO 0.45-1.5 MG per tablet TAKE 1 TABLET BY MOUTH EVERY DAY Qty: 28 tablet, Refills:  2    promethazine (PHENERGAN) 25 MG tablet Take 1 tablet (25 mg total) by mouth every 6 (six) hours as needed for nausea or vomiting. Qty: 30 tablet, Refills: 0    ondansetron (ZOFRAN) 4 MG tablet Take 1 tablet (4 mg total) by mouth daily as needed for nausea or vomiting. Qty: 30 tablet, Refills: 1       Allergies  Allergen Reactions  . Codeine Itching     The results of significant diagnostics from this hospitalization (including imaging, microbiology, ancillary and laboratory) are listed below for reference.    Significant Diagnostic Studies: Ct Head Wo Contrast  07/30/2014   CLINICAL DATA:  Altered mental status and hallucinations today.  EXAM: CT HEAD WITHOUT CONTRAST  TECHNIQUE: Contiguous axial images were obtained from the base of the skull through the vertex without intravenous contrast.  COMPARISON:  None.  FINDINGS: There is no evidence of acute intracranial abnormality including hemorrhage, infarct, mass lesion, mass effect, midline shift or abnormal extra-axial fluid collection. No hydrocephalus or pneumocephalus. The calvarium is intact. Imaged paranasal sinuses and mastoid air cells are clear.  IMPRESSION: Negative head CT.   Electronically Signed   By: Drusilla Kanner M.D.   On: 07/30/2014 20:18   US Abdomen Complete  07/30/2014   CLINICAL DATA:  Elevated liver function tests. Right upper quadrant pain. Symptoms for 1-1/2 months.  EXAM: ULTRASOUND ABDOMEN COMPLETE  COMPARISON:  Abdominal ultrasound 08/29/2008.  FINDINGS: Gallbladder: Removed.  Common bile duct: Diameter: 0.5 cm  Liver: No focal liver lesion is identified. The liver demonstrates increased echogenicity and coarsened echotexture. No intrahepatic biliary ductal dilatation is seen.  IVC: No abnormality visualized.  Pancreas: Visualized portion unremarkable.  Spleen: Size and appearance within normal limits.  Right Kidney: Length: 11.2 cm. Echogenicity within normal limits. No mass or hydronephrosis visualized.  Left  Kidney: Length: 11.3 cm. Echogenicity within normal limits. No mass or hydronephrosis visualized.  Abdominal aorta: Not visualized.  Other findings: None.  IMPRESSION: No acute abnormality.  Fatty infiltration of the liver.  Status post cholecystectomy.   Electronically Signed   By: Drusilla Kanner M.D.   On: 07/30/2014 19:05    Microbiology: Recent Results (from the past 240 hour(s))  Clostridium Difficile by PCR     Status: None   Collection Time: 07/31/14  2:27 AM  Result Value Ref Range Status   C difficile by pcr NEGATIVE NEGATIVE Final     Labs: Basic Metabolic Panel:  Recent Labs Lab 07/29/14 1719 07/30/14 1714 07/31/14 0427  NA 133* 134* 136  K 3.4* 3.2* 3.3*  CL 97 102 105  CO2 19 21 23   GLUCOSE 89 99 103*  BUN 12 15 11   CREATININE 0.72 0.79 0.69  CALCIUM 9.2 9.6 8.5  MG  --   --  1.6  PHOS  --   --  3.7   Liver Function Tests:  Recent Labs Lab 07/29/14 1719 07/30/14 1714 07/31/14 0427  AST 156* 144* 113*  ALT 112* 106* 81*  ALKPHOS 224* 227* 172*  BILITOT 2.1* 2.1* 2.0*  PROT 7.1 7.7 6.4  ALBUMIN 4.0 4.2 3.4*    Recent Labs Lab 07/29/14 1719  LIPASE 11  AMYLASE 11    Recent Labs Lab 07/30/14 1715 07/31/14 0427  AMMONIA 52* 44*   CBC:  Recent Labs Lab 07/29/14 1719 07/30/14 1714 07/31/14 0427  WBC 3.7* 3.7* 3.2*  NEUTROABS 2.1 1.9  --   HGB 12.2 12.5 10.7*  HCT 36.3 37.3 32.6*  MCV 99.7 100.5* 101.6*  PLT 125* 117* 105*   Signed:  Raspect, Erin, PA-S [facility] 07/31/2014, 11:37 AM   Addendum  I personally saw and evaluated patient on 07/31/2014 and agree with the above findings. Patient is Michele pleasant 49 year old Cooley who was admitted to the medicine service on 07/30/2014 presenting with acute encephalopathy and hallucinations. Initial lab work revealed an ammonia level of 81. Lab work also showed elevated AST of 156 and ALT of 112. AST 28 ALT ratio could be consistent with alcoholic hepatitis although patient denied  having alcohol in the past month and Michele half. Other possibilities include nonalcoholic sterile hepatitis along with infectious and autoimmune causes. I spoke with Dr.Magod of GI who recommended checking hepatitis panel, alpha 1 antitrypsin level, antinuclear anti-bodies, anti-smooth muscle antibiotic, ferritin level, ceruloplasmin level. Patient was instructed to follow-up with GI in 2 weeks. She was also instructed to not drink alcohol or take Tylenol, as well as checking her blood pressures regularly as she was hypertensive during this hospitalization. Clonidine 0.1 mg by mouth twice Michele day was added to her regimen. Please follow-up on blood pressures in the outpatient setting.

## 2014-07-31 NOTE — Progress Notes (Signed)
UR completed 

## 2014-08-01 LAB — ANTINUCLEAR ANTIBODIES, IFA

## 2014-08-01 LAB — FANA STAINING PATTERNS: Homogeneous Pattern: 1:160 {titer} — ABNORMAL HIGH

## 2014-08-01 LAB — ANTI-SMOOTH MUSCLE ANTIBODY, IGG: F-ACTIN AB IGG: 12 U (ref 0–19)

## 2014-08-04 ENCOUNTER — Telehealth: Payer: Self-pay | Admitting: *Deleted

## 2014-08-04 ENCOUNTER — Ambulatory Visit (HOSPITAL_COMMUNITY): Admission: RE | Admit: 2014-08-04 | Payer: Managed Care, Other (non HMO) | Source: Ambulatory Visit

## 2014-08-04 LAB — CERULOPLASMIN: Ceruloplasmin: 28 mg/dL (ref 18–53)

## 2014-08-04 LAB — ALPHA-1-ANTITRYPSIN: A1 ANTITRYPSIN SER: 118 mg/dL (ref 83–199)

## 2014-08-04 NOTE — Telephone Encounter (Signed)
Per Dr Rhea Belton- Patient was recently evaluated at [facility] and was told she had elevated lft's with possible decompensated liver disease and encephalopathy. Dr Rhea Belton would like [place] to get patient in to see [facility] liver clinic for evaluation. If needed, he will be glad to do endoscopy at some point. I will send records to [facility] to get an appointment for patient. I have spoken to patient and have advised her of this. She has verbalized understanding. I will contact her once I find out about an appointment time and date.

## 2014-08-04 NOTE — Telephone Encounter (Signed)
See previous phone note.  

## 2014-08-04 NOTE — Telephone Encounter (Signed)
-----   Message from Beverley Fiedler, MD sent at 08/04/2014  9:35 AM EST ----- Regarding: Referral Please refer to [facility] clinic for evaluation for reported history of elevated LFTs, recent admission with encephalopathy and decompensated liver disease

## 2014-08-12 ENCOUNTER — Encounter: Payer: Self-pay | Admitting: Physician Assistant

## 2014-08-12 NOTE — Telephone Encounter (Signed)
Patient has been scheduled for an appointment at [facility] with Chiquita Loth on 08/25/14 @ 2:15 pm. Patient has been advised.

## 2014-08-13 ENCOUNTER — Other Ambulatory Visit: Payer: Self-pay | Admitting: Physician Assistant

## 2014-08-14 ENCOUNTER — Other Ambulatory Visit: Payer: Self-pay

## 2014-08-15 ENCOUNTER — Other Ambulatory Visit (INDEPENDENT_AMBULATORY_CARE_PROVIDER_SITE_OTHER): Payer: Managed Care, Other (non HMO)

## 2014-08-15 ENCOUNTER — Other Ambulatory Visit: Payer: Self-pay | Admitting: Internal Medicine

## 2014-08-15 DIAGNOSIS — R899 Unspecified abnormal finding in specimens from other organs, systems and tissues: Secondary | ICD-10-CM

## 2014-08-16 ENCOUNTER — Other Ambulatory Visit: Payer: Self-pay | Admitting: Physician Assistant

## 2014-08-16 ENCOUNTER — Encounter: Payer: Self-pay | Admitting: Physician Assistant

## 2014-08-16 LAB — HEPATIC FUNCTION PANEL
ALBUMIN: 4.2 g/dL (ref 3.5–5.2)
ALK PHOS: 177 U/L — AB (ref 39–117)
ALT: 67 U/L — AB (ref 0–35)
AST: 161 U/L — AB (ref 0–37)
BILIRUBIN TOTAL: 1 mg/dL (ref 0.2–1.2)
Bilirubin, Direct: 0.3 mg/dL (ref 0.0–0.3)
Indirect Bilirubin: 0.7 mg/dL (ref 0.2–1.2)
TOTAL PROTEIN: 7 g/dL (ref 6.0–8.3)

## 2014-08-16 LAB — BASIC METABOLIC PANEL WITH GFR
BUN: 8 mg/dL (ref 6–23)
CO2: 26 meq/L (ref 19–32)
CREATININE: 0.72 mg/dL (ref 0.50–1.10)
Calcium: 9.5 mg/dL (ref 8.4–10.5)
Chloride: 99 mEq/L (ref 96–112)
GFR, Est Non African American: >89 mL/min
Glucose, Bld: 108 mg/dL — ABNORMAL HIGH (ref 70–99)
POTASSIUM: 3.1 meq/L — AB (ref 3.5–5.3)
Sodium: 140 mEq/L (ref 135–145)

## 2014-08-16 MED ORDER — LACTULOSE 10 GM/15ML PO SOLN: 20.0000 g | Freq: Two times a day (BID) | ORAL | Status: DC

## 2014-08-16 MED ORDER — POTASSIUM CHLORIDE ER 10 MEQ PO TBCR: 10.0000 meq | EXTENDED_RELEASE_TABLET | Freq: Every day | ORAL | Status: DC

## 2014-08-16 MED ORDER — PREDNISONE 20 MG PO TABS: 20.0000 mg | ORAL_TABLET | Freq: Every day | ORAL | Status: DC

## 2014-08-16 MED ORDER — AMLODIPINE BESYLATE 10 MG PO TABS: 10.0000 mg | ORAL_TABLET | Freq: Every day | ORAL | Status: DC

## 2014-08-26 ENCOUNTER — Other Ambulatory Visit (HOSPITAL_COMMUNITY): Payer: Self-pay | Admitting: Nurse Practitioner

## 2014-08-26 DIAGNOSIS — R945 Abnormal results of liver function studies: Principal | ICD-10-CM

## 2014-08-26 DIAGNOSIS — R7989 Other specified abnormal findings of blood chemistry: Secondary | ICD-10-CM

## 2014-08-29 ENCOUNTER — Other Ambulatory Visit: Payer: Self-pay | Admitting: Radiology

## 2014-09-01 ENCOUNTER — Other Ambulatory Visit: Payer: Self-pay | Admitting: Radiology

## 2014-09-02 ENCOUNTER — Ambulatory Visit (HOSPITAL_COMMUNITY)
Admission: RE | Admit: 2014-09-02 | Discharge: 2014-09-02 | Disposition: A | Discharge status: Home / Self Care | Payer: Managed Care, Other (non HMO) | Source: Ambulatory Visit | Attending: Interventional Radiology | Admitting: Interventional Radiology

## 2014-09-02 ENCOUNTER — Encounter (HOSPITAL_COMMUNITY): Payer: Self-pay

## 2014-09-02 DIAGNOSIS — R7989 Other specified abnormal findings of blood chemistry: Secondary | ICD-10-CM | POA: Diagnosis present

## 2014-09-02 DIAGNOSIS — G709 Myoneural disorder, unspecified: Secondary | ICD-10-CM | POA: Diagnosis not present

## 2014-09-02 DIAGNOSIS — E785 Hyperlipidemia, unspecified: Secondary | ICD-10-CM | POA: Insufficient documentation

## 2014-09-02 DIAGNOSIS — F329 Major depressive disorder, single episode, unspecified: Secondary | ICD-10-CM | POA: Insufficient documentation

## 2014-09-02 DIAGNOSIS — F419 Anxiety disorder, unspecified: Secondary | ICD-10-CM | POA: Diagnosis not present

## 2014-09-02 DIAGNOSIS — R945 Abnormal results of liver function studies: Secondary | ICD-10-CM

## 2014-09-02 DIAGNOSIS — Z6830 Body mass index (BMI) 30.0-30.9, adult: Secondary | ICD-10-CM | POA: Insufficient documentation

## 2014-09-02 DIAGNOSIS — E669 Obesity, unspecified: Secondary | ICD-10-CM | POA: Insufficient documentation

## 2014-09-02 DIAGNOSIS — Z9089 Acquired absence of other organs: Secondary | ICD-10-CM | POA: Insufficient documentation

## 2014-09-02 DIAGNOSIS — I10 Essential (primary) hypertension: Secondary | ICD-10-CM | POA: Insufficient documentation

## 2014-09-02 DIAGNOSIS — F1020 Alcohol dependence, uncomplicated: Secondary | ICD-10-CM | POA: Insufficient documentation

## 2014-09-02 DIAGNOSIS — K7581 Nonalcoholic steatohepatitis (NASH): Secondary | ICD-10-CM | POA: Diagnosis not present

## 2014-09-02 DIAGNOSIS — Z79899 Other long term (current) drug therapy: Secondary | ICD-10-CM | POA: Insufficient documentation

## 2014-09-02 DIAGNOSIS — M069 Rheumatoid arthritis, unspecified: Secondary | ICD-10-CM | POA: Insufficient documentation

## 2014-09-02 DIAGNOSIS — K219 Gastro-esophageal reflux disease without esophagitis: Secondary | ICD-10-CM | POA: Diagnosis not present

## 2014-09-02 DIAGNOSIS — Z7982 Long term (current) use of aspirin: Secondary | ICD-10-CM | POA: Insufficient documentation

## 2014-09-02 DIAGNOSIS — Z9049 Acquired absence of other specified parts of digestive tract: Secondary | ICD-10-CM | POA: Insufficient documentation

## 2014-09-02 LAB — CBC
HCT: 39.1 % (ref 36.0–46.0)
Hemoglobin: 13 g/dL (ref 12.0–15.0)
MCH: 32.8 pg (ref 26.0–34.0)
MCHC: 33.2 g/dL (ref 30.0–36.0)
MCV: 98.7 fL (ref 78.0–100.0)
Platelets: 282 10*3/uL (ref 150–400)
RBC: 3.96 MIL/uL (ref 3.87–5.11)
RDW: 14.8 % (ref 11.5–15.5)
WBC: 4.7 10*3/uL (ref 4.0–10.5)

## 2014-09-02 LAB — PROTIME-INR
INR: 1 (ref 0.00–1.49)
Prothrombin Time: 13.3 seconds (ref 11.6–15.2)

## 2014-09-02 LAB — APTT: aPTT: 29 seconds (ref 24–37)

## 2014-09-02 MED ORDER — MIDAZOLAM HCL 2 MG/2ML IJ SOLN
INTRAMUSCULAR | Status: AC | PRN
  Administered 2014-09-02 (×2): 1 mg via INTRAVENOUS

## 2014-09-02 MED ORDER — FENTANYL CITRATE 0.05 MG/ML IJ SOLN
INTRAMUSCULAR | Status: AC | PRN
  Administered 2014-09-02 (×2): 25 ug via INTRAVENOUS
  Administered 2014-09-02: 50 ug via INTRAVENOUS

## 2014-09-02 MED ORDER — SODIUM CHLORIDE 0.9 % IV SOLN: Freq: Once | INTRAVENOUS | Status: DC

## 2014-09-02 MED ORDER — GELATIN ABSORBABLE 12-7 MM EX MISC
CUTANEOUS | Status: AC
  Filled 2014-09-02: qty 1

## 2014-09-02 MED ORDER — MIDAZOLAM HCL 2 MG/2ML IJ SOLN
INTRAMUSCULAR | Status: AC
  Filled 2014-09-02: qty 2

## 2014-09-02 MED ORDER — FENTANYL CITRATE 0.05 MG/ML IJ SOLN
INTRAMUSCULAR | Status: AC
  Filled 2014-09-02: qty 2

## 2014-09-02 MED ORDER — LIDOCAINE-EPINEPHRINE 1 %-1:100000 IJ SOLN
INTRAMUSCULAR | Status: AC
  Filled 2014-09-02: qty 1

## 2014-09-02 NOTE — Procedures (Signed)
Technically successful US guided biopsy of right lobe of the liver.  No immediate complications.  

## 2014-09-02 NOTE — H&P (Signed)
Chief Complaint: Elevated liver function tests  Referring Physician(s): Drazek,Dawn  History of Present Illness: Michele Cooley is a 49 y.o. 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 Neuromuscular disorder   . Leg pain, right   . Anxiety   . Rheumatoid arthritis involving multiple joints     rheumatoid arthritis- hands, degeneration of lumbar  spine   . GERD (gastroesophageal reflux disease)   . Mental disorder   . Eye irritation     R eye- redness- from removing contact lense  . Hypertension   . Hyperlipidemia   . Allergy   . Herpes simplex type II infection   . Obesity   . Depression   . Alcoholism     Past Surgical History  Procedure Laterality Date  . Lumbar laminectomy/decompression microdiscectomy  10/18/2011    Procedure: LUMBAR LAMINECTOMY/DECOMPRESSION MICRODISCECTOMY;  Surgeon: Karn Cassis, MD;  Location: [facility];  Service: Neurosurgery;  Laterality: Right;  Redo Right Lumbar four-fivelaminectomy microdiskectomy  . Back surgery      09/2011, first back surgery- 2000    . Eye surgery      lasik procedure very long ago- not successful  . Cholecystectomy      09/2008  . Parotidectomy Left     Allergies: Codeine  Medications: Prior to Admission medications   Medication Sig Start Date End Date Taking? Authorizing Provider  ALPRAZolam Prudy Feeler) 0.5 MG tablet TAKE 1 TABLET BY MOUTH 3 TIMES A DAY AS NEEDED ANXIETY 07/31/14  Yes Quentin Mulling, PA-C  amLODipine (NORVASC) 10 MG tablet Take 1 tablet (10 mg total) by mouth daily. 08/16/14 08/16/15 Yes Quentin Mulling, PA-C  aspirin 81 MG tablet Take 81 mg by mouth daily.   Yes Historical Provider, MD  atenolol  (TENORMIN) 100 MG tablet TAKE 1 TABLET BY MOUTH TWICE DAILY 07/15/14  Yes Lucky Cowboy, MD  Cholecalciferol (VITAMIN D) 2000 UNITS CAPS Take 2,000 Units by mouth daily.   Yes Historical Provider, MD  citalopram (CELEXA) 40 MG tablet TAKE 1 TABLET (40 MG TOTAL) BY MOUTH DAILY. 08/13/14  Yes Lucky Cowboy, MD  hydroxypropyl methylcellulose / hypromellose (ISOPTO TEARS / GONIOVISC) 2.5 % ophthalmic solution Place 1 drop into both eyes 3 (three) times daily as needed for dry eyes.   Yes Historical Provider, MD  lactulose (CHRONULAC) 10 GM/15ML solution Take 30 mLs (20 g total) by mouth 2 (two) times daily. 08/16/14  Yes Quentin Mulling, PA-C  losartan (COZAAR) 100 MG tablet Take 1 tablet (100 mg total) by mouth daily. 05/12/14 05/12/15 Yes Quentin Mulling, PA-C  naproxen sodium (ANAPROX) 220 MG tablet Take 440 mg by mouth 2 (two) times daily with a meal.   Yes Historical Provider, MD  ondansetron (ZOFRAN) 4 MG tablet Take 1 tablet (4 mg total) by mouth daily as needed for nausea or vomiting. 07/28/14 07/28/15 Yes Quentin Mulling, PA-C  pantoprazole (PROTONIX) 40 MG tablet Take 40 mg by mouth daily. 03/17/14  Yes Historical Provider, MD  potassium chloride (K-DUR) 10 MEQ tablet Take 1 tablet (10 mEq total) by mouth daily. 08/16/14  Yes Quentin Mulling, PA-C  predniSONE (DELTASONE) 20 MG tablet Take 1 tablet (20 mg total) by mouth daily with breakfast. 08/16/14  Yes Quentin Mulling,  PA-C  promethazine (PHENERGAN) 25 MG tablet Take 25 mg by mouth every 6 (six) hours as needed. 07/28/14  Yes Historical Provider, MD  rifaximin (XIFAXAN) 550 MG TABS tablet Take 550 mg by mouth 2 (two) times daily.   Yes Historical Provider, MD  cloNIDine (CATAPRES) 0.1 MG tablet Take 1 tablet (0.1 mg total) by mouth 2 (two) times daily. Patient not taking: Reported on 08/29/2014 07/31/14   Jeralyn Bennett, MD  PREMPRO 0.45-1.5 MG per tablet TAKE 1 TABLET BY MOUTH EVERY DAY Patient not taking: Reported on 08/29/2014 06/23/14   Quentin Mulling,  PA-C     Family History  Problem Relation Age of Onset  . Hypertension Mother   . Stroke Mother   . COPD Father   . Heart disease Father   . Heart disease Sister     History   Social History  . Marital Status: Married    Spouse Name: N/A  . Number of Children: N/A  . Years of Education: N/A   Social History Main Topics  . Smoking status: Never Smoker   . Smokeless tobacco: Never Used  . Alcohol Use: 0.0 oz/week    0 Standard drinks or equivalent per week     Comment: + history of alcohol abuse and history of DTs with hallucinations.   . Drug Use: No  . Sexual Activity: Not on file   Other Topics Concern  . None   Social History Narrative     Review of Systems: A 12 point ROS discussed and pertinent positives are indicated in the HPI above.  All other systems are negative.  Review of Systems  Constitutional: Positive for fatigue. Negative for fever, activity change and appetite change.  Respiratory: Negative for cough and shortness of breath.   Gastrointestinal: Positive for nausea. Negative for abdominal pain.  Neurological: Negative for weakness.  Psychiatric/Behavioral: Negative for behavioral problems and confusion.    Vital Signs: BP 155/98 mmHg  Pulse 67  Temp(Src) 98.3 F (36.8 C) (Oral)  Resp 18  Ht 5\' 6"  (1.676 m)  Wt 85.276 kg (188 lb)  BMI 30.36 kg/m2  SpO2 100%  LMP 06/14/2012  Physical Exam  Constitutional: She is oriented to person, place, and time. She appears well-nourished.  Cardiovascular: Normal rate, regular rhythm and normal heart sounds.   No murmur heard. Pulmonary/Chest: Effort normal and breath sounds normal. She has no wheezes.  Abdominal: Soft. Bowel sounds are normal. There is no tenderness.  Musculoskeletal: Normal range of motion.  Neurological: She is alert and oriented to person, place, and time.  Skin: Skin is warm and dry.  Psychiatric: She has a normal mood and affect. Her behavior is normal. Judgment and thought  content normal.  Nursing note and vitals reviewed.   Mallampati Score:  MD Evaluation Airway: WNL Heart: WNL Abdomen: WNL Chest/ Lungs: WNL ASA  Classification: 2 Mallampati/Airway Score: Two  Imaging: No results found.  Labs:  CBC:  Recent Labs  07/10/14 1506 07/29/14 1719 07/30/14 1714 07/31/14 0427  WBC 6.4 3.7* 3.7* 3.2*  HGB 12.0 12.2 12.5 10.7*  HCT 35.7* 36.3 37.3 32.6*  PLT 113* 125* 117* 105*    COAGS:  Recent Labs  07/29/14 1719 07/30/14 2050  INR 1.02 1.06    BMP:  Recent Labs  07/29/14 1719 07/30/14 1714 07/31/14 0427 08/15/14 1126  NA 133* 134* 136 140  K 3.4* 3.2* 3.3* 3.1*  CL 97 102 105 99  CO2 19 21 23 26   GLUCOSE 89 99 103*  108*  BUN 12 15 11 8   CALCIUM 9.2 9.6 8.5 9.5  CREATININE 0.72 0.79 0.69 0.72  GFRNONAA >89 >90 >90 >89  GFRAA >89 >90 >90 >89    LIVER FUNCTION TESTS:  Recent Labs  07/29/14 1719 07/30/14 1714 07/31/14 0427 08/15/14 1126  BILITOT 2.1* 2.1* 2.0* 1.0  AST 156* 144* 113* 161*  ALT 112* 106* 81* 67*  ALKPHOS 224* 227* 172* 177*  PROT 7.1 7.7 6.4 7.0  ALBUMIN 4.0 4.2 3.4* 4.2    TUMOR MARKERS: No results for input(s): AFPTM, CEA, CA199, CHROMGRNA in the last 8760 hours.  Assessment and Plan:  Elevated LFTs Poss autoimmune hepatitis Fatty liver on 08/17/14 Now scheduled for random liver core bx Risks and Benefits discussed with the patient including, but not limited to bleeding, infection, damage to adjacent structures or low yield requiring additional tests. All of the patient's questions were answered, patient is agreeable to proceed. Consent signed and in chart.    Thank you for this interesting consult.  I greatly enjoyed meeting Michele Cooley and look forward to participating in their care.  Signed: Tennyson Wacha A 09/02/2014, 9:34 AM   I spent a total of  20 Minutes   in face to face in clinical consultation, greater than 50% of which was counseling/coordinating care for random liver  bx

## 2014-09-02 NOTE — Sedation Documentation (Signed)
MD obtaining samples. Pt awake, no c/o pain at this point but very nervous.

## 2014-09-02 NOTE — Discharge Instructions (Signed)
Liver Biopsy, Care After °These instructions give you information on caring for yourself after your procedure. Your doctor may also give you more specific instructions. Call your doctor if you have any problems or questions after your procedure. °HOME CARE °· Rest at home for 1-2 days or as told by your doctor. °· Have someone stay with you for at least 24 hours. °· Do not do these things in the first 24 hours: °¨ Drive. °¨ Use machinery. °¨ Take care of other people. °¨ Sign legal documents. °¨ Take a bath or shower. °· There are many different ways to close and cover a cut (incision). For example, a cut can be closed with stitches, skin glue, or adhesive strips. Follow your doctor's instructions on: °¨ Taking care of your cut. °¨ Changing and removing your bandage (dressing). °¨ Removing whatever was used to close your cut. °· Do not drink alcohol in the first week. °· Do not lift more than 5 pounds or play contact sports for the first 2 weeks. °· Take medicines only as told by your doctor. For 1 week, do not take medicine that has aspirin in it or medicines like ibuprofen. °· Get your test results. °GET HELP IF: °· A cut bleeds and leaves more than just a small spot of blood. °· A cut is red, puffs up (swells), or hurts more than before. °· Fluid or something else comes from a cut. °· A cut smells bad. °· You have a fever or chills. °GET HELP RIGHT AWAY IF: °· You have swelling, bloating, or pain in your belly (abdomen). °· You get dizzy or faint. °· You have a rash. °· You feel sick to your stomach (nauseous) or throw up (vomit). °· You have trouble breathing, feel short of breath, or feel faint. °· Your chest hurts. °· You have problems talking or seeing. °· You have trouble balancing or moving your arms or legs. °Document Released: 02/23/2008 Document Revised: 09/30/2013 Document Reviewed: 07/12/2013 °[employer]® Patient Information ©2015 [employer]. This information is not intended to replace advice given to  you by your health care provider. Make sure you discuss any questions you have with your health care provider. ° °

## 2014-09-05 ENCOUNTER — Telehealth: Payer: Self-pay | Admitting: *Deleted

## 2014-09-05 NOTE — Telephone Encounter (Signed)
Left message for patient to call back. Per [facility] note from [facility], NP, patient has ongoing nausea and vomiting in addition to her liver disease. She would like this evaluated possibly with endoscopy and colonoscopy. Dr Rhea Belton would like to see patient in office.

## 2014-09-05 NOTE — Telephone Encounter (Signed)
Patient states that she will get back with [place] at a later time to schedule an office visit.

## 2014-09-11 ENCOUNTER — Other Ambulatory Visit: Payer: Self-pay | Admitting: Physician Assistant

## 2014-09-22 ENCOUNTER — Ambulatory Visit: Payer: Managed Care, Other (non HMO) | Admitting: Physician Assistant

## 2014-09-22 ENCOUNTER — Ambulatory Visit: Payer: Self-pay | Admitting: Physician Assistant

## 2014-09-22 ENCOUNTER — Ambulatory Visit (INDEPENDENT_AMBULATORY_CARE_PROVIDER_SITE_OTHER): Payer: Managed Care, Other (non HMO) | Admitting: Physician Assistant

## 2014-09-22 ENCOUNTER — Encounter: Payer: Self-pay | Admitting: Physician Assistant

## 2014-09-22 VITALS — BP 122/82 | HR 84 | Temp 97.7°F | Resp 16 | Ht 66.0 in | Wt 188.0 lb

## 2014-09-22 DIAGNOSIS — K2100 Gastro-esophageal reflux disease with esophagitis, without bleeding: Secondary | ICD-10-CM

## 2014-09-22 DIAGNOSIS — Z91199 Patient's noncompliance with other medical treatment and regimen due to unspecified reason: Secondary | ICD-10-CM

## 2014-09-22 DIAGNOSIS — K7682 Hepatic encephalopathy: Secondary | ICD-10-CM

## 2014-09-22 DIAGNOSIS — E785 Hyperlipidemia, unspecified: Secondary | ICD-10-CM

## 2014-09-22 DIAGNOSIS — Z79899 Other long term (current) drug therapy: Secondary | ICD-10-CM

## 2014-09-22 DIAGNOSIS — M069 Rheumatoid arthritis, unspecified: Secondary | ICD-10-CM

## 2014-09-22 DIAGNOSIS — K7290 Hepatic failure, unspecified without coma: Secondary | ICD-10-CM

## 2014-09-22 DIAGNOSIS — E669 Obesity, unspecified: Secondary | ICD-10-CM

## 2014-09-22 DIAGNOSIS — E559 Vitamin D deficiency, unspecified: Secondary | ICD-10-CM

## 2014-09-22 DIAGNOSIS — I10 Essential (primary) hypertension: Secondary | ICD-10-CM

## 2014-09-22 DIAGNOSIS — R1013 Epigastric pain: Secondary | ICD-10-CM

## 2014-09-22 DIAGNOSIS — Z9119 Patient's noncompliance with other medical treatment and regimen: Secondary | ICD-10-CM

## 2014-09-22 LAB — CBC WITH DIFFERENTIAL/PLATELET
BASOS PCT: 3 % — AB (ref 0–1)
Basophils Absolute: 0.1 10*3/uL (ref 0.0–0.1)
EOS PCT: 3 % (ref 0–5)
Eosinophils Absolute: 0.1 10*3/uL (ref 0.0–0.7)
HCT: 36 % (ref 36.0–46.0)
Hemoglobin: 12.2 g/dL (ref 12.0–15.0)
LYMPHS PCT: 33 % (ref 12–46)
Lymphs Abs: 1.3 10*3/uL (ref 0.7–4.0)
MCH: 33.3 pg (ref 26.0–34.0)
MCHC: 33.9 g/dL (ref 30.0–36.0)
MCV: 98.4 fL (ref 78.0–100.0)
MONOS PCT: 14 % — AB (ref 3–12)
MPV: 9.6 fL (ref 8.6–12.4)
Monocytes Absolute: 0.5 10*3/uL (ref 0.1–1.0)
Neutro Abs: 1.8 10*3/uL (ref 1.7–7.7)
Neutrophils Relative %: 47 % (ref 43–77)
PLATELETS: 189 10*3/uL (ref 150–400)
RBC: 3.66 MIL/uL — AB (ref 3.87–5.11)
RDW: 15.8 % — AB (ref 11.5–15.5)
WBC: 3.8 10*3/uL — AB (ref 4.0–10.5)

## 2014-09-22 MED ORDER — SUCRALFATE 1 G PO TABS: 1.0000 g | ORAL_TABLET | Freq: Three times a day (TID) | ORAL | Status: DC | PRN

## 2014-09-22 NOTE — Patient Instructions (Addendum)
Get on protonix daily Can take carafate before food, do not take with rifaximin Increase prednisone to 20mg  TWO PILLS A DAY WITH FOOD for 7 days then go back down to one If worsening pain go to UC/ER   Costochondritis Costochondritis, sometimes called Tietze syndrome, is a swelling and irritation (inflammation) of the tissue (cartilage) that connects your ribs with your breastbone (sternum). It causes pain in the chest and rib area. Costochondritis usually goes away on its own over time. It can take up to 6 weeks or longer to get better, especially if you are unable to limit your activities. CAUSES  Some cases of costochondritis have no known cause. Possible causes include:  Injury (trauma).  Exercise or activity such as lifting.  Severe coughing. SIGNS AND SYMPTOMS  Pain and tenderness in the chest and rib area.  Pain that gets worse when coughing or taking deep breaths.  Pain that gets worse with specific movements. DIAGNOSIS  Your health care provider will do a physical exam and ask about your symptoms. Chest X-rays or other tests may be done to rule out other problems. TREATMENT  Costochondritis usually goes away on its own over time. Your health care provider may prescribe medicine to help relieve pain. HOME CARE INSTRUCTIONS   Avoid exhausting physical activity. Try not to strain your ribs during normal activity. This would include any activities using chest, abdominal, and side muscles, especially if heavy weights are used.  Apply ice to the affected area for the first 2 days after the pain begins.  Put ice in a plastic bag.  Place a towel between your skin and the bag.  Leave the ice on for 20 minutes, 2-3 times a day.  Only take over-the-counter or prescription medicines as directed by your health care provider. SEEK MEDICAL CARE IF:  You have redness or swelling at the rib joints. These are signs of infection.  Your pain does not go away despite rest or  medicine. SEEK IMMEDIATE MEDICAL CARE IF:   Your pain increases or you are very uncomfortable.  You have shortness of breath or difficulty breathing.  You cough up blood.  You have worse chest pains, sweating, or vomiting.  You have a fever or persistent symptoms for more than 2-3 days.  You have a fever and your symptoms suddenly get worse. MAKE SURE YOU:   Understand these instructions.  Will watch your condition.  Will get help right away if you are not doing well or get worse. Document Released: 02/23/2005 Document Revised: 03/06/2013 Document Reviewed: 12/18/2012 [facility] Patient Information 2015 [place], [place]. This information is not intended to replace advice given to you by your health care provider. Make sure you discuss any questions you have with your health care provider.  Gastritis, Adult Gastritis is soreness and swelling (inflammation) of the lining of the stomach. Gastritis can develop as a sudden onset (acute) or long-term (chronic) condition. If gastritis is not treated, it can lead to stomach bleeding and ulcers. CAUSES  Gastritis occurs when the stomach lining is weak or damaged. Digestive juices from the stomach then inflame the weakened stomach lining. The stomach lining may be weak or damaged due to viral or bacterial infections. One common bacterial infection is the Helicobacter pylori infection. Gastritis can also result from excessive alcohol consumption, taking certain medicines, or having too much acid in the stomach.  SYMPTOMS  In some cases, there are no symptoms. When symptoms are present, they may include:  Pain or a burning  sensation in the upper abdomen.  Nausea.  Vomiting.  An uncomfortable feeling of fullness after eating. DIAGNOSIS  Your caregiver may suspect you have gastritis based on your symptoms and a physical exam. To determine the cause of your gastritis, your caregiver may perform the following:  Blood or stool tests to check for  the H pylori bacterium.  Gastroscopy. A thin, flexible tube (endoscope) is passed down the esophagus and into the stomach. The endoscope has a light and camera on the end. Your caregiver uses the endoscope to view the inside of the stomach.  Taking a tissue sample (biopsy) from the stomach to examine under a microscope. TREATMENT  Depending on the cause of your gastritis, medicines may be prescribed. If you have a bacterial infection, such as an H pylori infection, antibiotics may be given. If your gastritis is caused by too much acid in the stomach, H2 blockers or antacids may be given. Your caregiver may recommend that you stop taking aspirin, ibuprofen, or other nonsteroidal anti-inflammatory drugs (NSAIDs). HOME CARE INSTRUCTIONS  Only take over-the-counter or prescription medicines as directed by your caregiver.  If you were given antibiotic medicines, take them as directed. Finish them even if you start to feel better.  Drink enough fluids to keep your urine clear or pale yellow.  Avoid foods and drinks that make your symptoms worse, such as:  Caffeine or alcoholic drinks.  Chocolate.  Peppermint or mint flavorings.  Garlic and onions.  Spicy foods.  Citrus fruits, such as oranges, lemons, or limes.  Tomato-based foods such as sauce, chili, salsa, and pizza.  Fried and fatty foods.  Eat small, frequent meals instead of large meals. SEEK IMMEDIATE MEDICAL CARE IF:   You have black or dark red stools.  You vomit blood or material that looks like coffee grounds.  You are unable to keep fluids down.  Your abdominal pain gets worse.  You have a fever.  You do not feel better after 1 week.  You have any other questions or concerns. MAKE SURE YOU:  Understand these instructions.  Will watch your condition.  Will get help right away if you are not doing well or get worse. Document Released: 05/10/2001 Document Revised: 11/15/2011 Document Reviewed:  06/29/2011 [facility] Patient Information 2015 [place], [place]. This information is not intended to replace advice given to you by your health care provider. Make sure you discuss any questions you have with your health care provider.

## 2014-09-22 NOTE — Progress Notes (Signed)
Assessment and Plan:  1. Hypertension -Continue medication, monitor blood pressure at home. Continue DASH diet.  Reminder to go to the ER if any CP, SOB, nausea, dizziness, severe HA, changes vision/speech, left arm numbness and tingling and jaw pain.  2. Cholesterol -Continue diet and exercise. Check cholesterol.   3. RF Continue follow up with rheum, Dr. Mariah Milling  4. Vitamin D Def - check level and continue medications.   5. AB pain Gastritis, pancreatitis, costochondritis very tender to palpation of rib cage and epigastric area without rebound, no N/V so doubtful pancreatitis however we will get labs.   Continue diet and meds as discussed. Further disposition pending results of labs. Over 30 minutes of exam, counseling, chart review, and critical decision making was performed  Future Appointments Date Time Provider Department Center  12/26/2014 10:00 AM Quentin Mulling, PA-C [facility] None  06/29/2015 9:00 AM Quentin Mulling, PA-C [facility] None     HPI 49 y.o. female  presents for 3 month follow up on hypertension, cholesterol, elevated LFTs, RF, possible autoimmune hepatitis and vitamin D deficiency. She presents today with AB pain.  She had a complicated course starting in Feb for AB pain, diarrhea, hallucinating, copmlicated further by a lapse in her insurance. She currently is being seen by Dr. Mariah Milling at [facility] for her RA and possible autoimmune hepatitis. She is just on prednisone for her RA, last visit 08/26/2014. In addition she is being followed by Dr. Rhea Belton and was referred to [facility] hepatology Transplant clinic and has alphfetoprotin, hepatitis A, B, serum immunogloboins, PT, LKM antibiodies, copper, and mitrochondrial antibodies done with a liver biopsy and goes back next week for resutls. .  Lab Results  Component Value Date   ALT 67* 08/15/2014   AST 161* 08/15/2014   ALKPHOS 177* 08/15/2014   BILITOT 1.0 08/15/2014  She started to have  epigastric pain Friday after lunch. It is constant sharp pain, worse with a deep breath, bending over, movement worse, better with not moving, has decrease appetite, denies N,V, diarrhea, constipation, dark black stools, blood in stool, fever, chillsl, aleve helps some. No leg swelling, no palpitations, SOB, cough.   Her blood pressure has been controlled at home, today their BP is BP: 122/82 mmHg  She does not workout. She denies chest pain, shortness of breath, dizziness.  She is not on cholesterol medication and denies myalgias. Her cholesterol is not at goal. The cholesterol last visit was:   Lab Results  Component Value Date   CHOL 278* 06/25/2014   HDL 116 06/25/2014   LDLCALC 147* 06/25/2014   TRIG 73 06/25/2014   CHOLHDL 2.4 06/25/2014  Last A1C in the office was:  Lab Results  Component Value Date   HGBA1C 5.3 08/21/2013  Patient is on Vitamin D supplement.   Lab Results  Component Value Date   VD25OH 25* 06/25/2014      Current Medications:  Current Outpatient Prescriptions on File Prior to Visit  Medication Sig Dispense Refill  . ALPRAZolam (XANAX) 0.5 MG tablet TAKE 1 TABLET BY MOUTH 3 TIMES A DAY AS NEEDED ANXIETY 90 tablet 0  . amLODipine (NORVASC) 10 MG tablet Take 1 tablet (10 mg total) by mouth daily. 30 tablet 11  . atenolol (TENORMIN) 100 MG tablet TAKE 1 TABLET BY MOUTH TWICE DAILY 180 tablet 0  . citalopram (CELEXA) 40 MG tablet TAKE 1 TABLET (40 MG TOTAL) BY MOUTH DAILY. 90 tablet 1  . hydroxypropyl methylcellulose / hypromellose (ISOPTO TEARS /  GONIOVISC) 2.5 % ophthalmic solution Place 1 drop into both eyes 3 (three) times daily as needed for dry eyes.    Marland Kitchen lactulose (CHRONULAC) 10 GM/15ML solution Take 30 mLs (20 g total) by mouth 2 (two) times daily. 240 mL 0  . losartan (COZAAR) 100 MG tablet TAKE 1 TABLET (100 MG TOTAL) BY MOUTH DAILY. 30 tablet 3  . ondansetron (ZOFRAN) 4 MG tablet Take 1 tablet (4 mg total) by mouth daily as needed for nausea or vomiting.  30 tablet 1  . pantoprazole (PROTONIX) 40 MG tablet Take 40 mg by mouth daily.    . potassium chloride (K-DUR) 10 MEQ tablet Take 1 tablet (10 mEq total) by mouth daily. 60 tablet 2  . predniSONE (DELTASONE) 20 MG tablet Take 1 tablet (20 mg total) by mouth daily with breakfast. 20 tablet 0  . promethazine (PHENERGAN) 25 MG tablet Take 25 mg by mouth every 6 (six) hours as needed.    . rifaximin (XIFAXAN) 550 MG TABS tablet Take 550 mg by mouth 2 (two) times daily.     No current facility-administered medications on file prior to visit.   Medical History:  Past Medical History  Diagnosis Date  . Neuromuscular disorder   . Leg pain, right   . Anxiety   . Rheumatoid arthritis involving multiple joints     rheumatoid arthritis- hands, degeneration of lumbar  spine   . GERD (gastroesophageal reflux disease)   . Mental disorder   . Eye irritation     R eye- redness- from removing contact lense  . Hypertension   . Hyperlipidemia   . Allergy   . Herpes simplex type II infection   . Obesity   . Depression   . Alcoholism    Allergies:  Allergies  Allergen Reactions  . Codeine Itching     Review of Systems:  Review of Systems  Constitutional: Positive for malaise/fatigue. Negative for fever, chills, weight loss and diaphoresis.  HENT: Negative.   Eyes: Negative.   Respiratory: Negative for cough, hemoptysis, sputum production, shortness of breath and wheezing.   Cardiovascular: Positive for chest pain. Negative for palpitations, orthopnea, claudication, leg swelling and PND.  Gastrointestinal: Positive for heartburn and abdominal pain. Negative for nausea, vomiting, diarrhea, constipation, blood in stool and melena.  Genitourinary: Negative.   Musculoskeletal: Positive for joint pain. Negative for myalgias, back pain, falls and neck pain.  Skin: Negative.   Neurological: Negative.  Negative for weakness and headaches.  Psychiatric/Behavioral: Negative.     Family history-  Review and unchanged Social history- Review and unchanged Physical Exam: BP 122/82 mmHg  Pulse 84  Temp(Src) 97.7 F (36.5 C)  Resp 16  Ht 5\' 6"  (1.676 m)  Wt 188 lb (85.276 kg)  BMI 30.36 kg/m2  LMP 06/14/2012 Wt Readings from Last 3 Encounters:  09/22/14 188 lb (85.276 kg)  07/29/14 196 lb (88.905 kg)  07/08/14 199 lb (90.266 kg)   General Appearance: Well nourished, in no apparent distress. Eyes: PERRLA, EOMs, conjunctiva no swelling or erythema Sinuses: No Frontal/maxillary tenderness ENT/Mouth: Ext aud canals clear, TMs without erythema, bulging. No erythema, swelling, or exudate on post pharynx.  Tonsils not swollen or erythematous.  Neck: Supple, thyroid normal.  Chest: + tenderness left chest to palpation.  Respiratory: Respiratory effort normal, BS equal bilaterally without rales, rhonchi, wheezing or stridor.  Cardio: RRR with no MRGs. Brisk peripheral pulses without edema.  Abdomen: Soft, + BS, obese, + epigastric tenderness with guarding, without rebound, hernias, masses,  spleen normal. Lymphatics: Non tender without lymphadenopathy.  Musculoskeletal: Full ROM, 5/5 strength, Normal gait Skin: Warm, dry without rashes, lesions, ecchymosis.  Neuro: Cranial nerves intact. Normal muscle tone, no cerebellar symptoms. Psych: Awake and oriented X 3, normal affect, Insight and Judgment appropriate.    Quentin Mulling, PA-C 12:01 PM [facility]

## 2014-09-23 LAB — LIPID PANEL
CHOLESTEROL: 350 mg/dL — AB (ref 0–200)
HDL: 118 mg/dL (ref >=46)
LDL Cholesterol: 200 mg/dL — ABNORMAL HIGH (ref 0–99)
Total CHOL/HDL Ratio: 3 Ratio
Triglycerides: 159 mg/dL — ABNORMAL HIGH (ref <150)
VLDL: 32 mg/dL (ref 0–40)

## 2014-09-23 LAB — HEPATIC FUNCTION PANEL
ALBUMIN: 4.3 g/dL (ref 3.5–5.2)
ALK PHOS: 133 U/L — AB (ref 39–117)
ALT: 47 U/L — ABNORMAL HIGH (ref 0–35)
AST: 69 U/L — AB (ref 0–37)
BILIRUBIN DIRECT: 0.1 mg/dL (ref 0.0–0.3)
BILIRUBIN INDIRECT: 0.3 mg/dL (ref 0.2–1.2)
BILIRUBIN TOTAL: 0.4 mg/dL (ref 0.2–1.2)
Total Protein: 7.4 g/dL (ref 6.0–8.3)

## 2014-09-23 LAB — BASIC METABOLIC PANEL WITH GFR
BUN: 14 mg/dL (ref 6–23)
CHLORIDE: 104 meq/L (ref 96–112)
CO2: 20 meq/L (ref 19–32)
Calcium: 9.1 mg/dL (ref 8.4–10.5)
Creat: 0.73 mg/dL (ref 0.50–1.10)
GFR, Est African American: >89 mL/min
GLUCOSE: 114 mg/dL — AB (ref 70–99)
POTASSIUM: 4 meq/L (ref 3.5–5.3)
SODIUM: 142 meq/L (ref 135–145)

## 2014-09-23 LAB — TSH: TSH: 0.451 u[IU]/mL (ref 0.350–4.500)

## 2014-09-23 LAB — MAGNESIUM: MAGNESIUM: 1.7 mg/dL (ref 1.5–2.5)

## 2014-09-23 LAB — VITAMIN D 25 HYDROXY (VIT D DEFICIENCY, FRACTURES): Vit D, 25-Hydroxy: 26 ng/mL — ABNORMAL LOW (ref 30–100)

## 2014-09-23 LAB — AMYLASE: Amylase: 20 U/L (ref 0–105)

## 2014-10-09 ENCOUNTER — Ambulatory Visit: Payer: Self-pay | Admitting: Internal Medicine

## 2014-10-09 ENCOUNTER — Encounter: Payer: Self-pay | Admitting: Internal Medicine

## 2014-10-09 ENCOUNTER — Ambulatory Visit (INDEPENDENT_AMBULATORY_CARE_PROVIDER_SITE_OTHER): Payer: Managed Care, Other (non HMO) | Admitting: Internal Medicine

## 2014-10-09 ENCOUNTER — Encounter (HOSPITAL_COMMUNITY): Payer: Self-pay

## 2014-10-09 VITALS — BP 122/72 | HR 72 | Temp 97.7°F | Resp 16 | Ht 66.0 in | Wt 191.0 lb

## 2014-10-09 DIAGNOSIS — M940 Chondrocostal junction syndrome [Tietze]: Secondary | ICD-10-CM

## 2014-10-09 NOTE — Progress Notes (Signed)
Subjective:    Patient ID: Michele Cooley, female    DOB: [birth year], 49 y.o.   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 Dr Redmond Pulling for her Rheumatoid Arthritis returns again today with c/o bilat lower ant chest wall soreness & pain which she rates sometimes up to 6-7/10. Denies any respiratory sx's - cough, congestion, sputum, fever, chills, dyspnea and likewise GI systems review is negative.   Medication Sig  . ALPRAZolam (XANAX) 0.5 MG tablet TAKE 1 TABLET BY MOUTH 3 TIMES A DAY AS NEEDED ANXIETY  . atenolol (TENORMIN) 100 MG tablet TAKE 1 TABLET BY MOUTH TWICE DAILY  . citalopram (CELEXA) 40 MG tablet TAKE 1 TABLET (40 MG TOTAL) BY MOUTH DAILY.  . hydroxypropyl methylcellulose / hypromellose (ISOPTO TEARS / GONIOVISC) 2.5 % ophthalmic solution Place 1 drop into both eyes 3 (three) times daily as needed for dry eyes.  Marland Kitchen losartan (COZAAR) 100 MG tablet TAKE 1 TABLET (100 MG TOTAL) BY MOUTH DAILY.  Marland Kitchen ondansetron (ZOFRAN) 4 MG tablet Take 1 tablet (4 mg total) by mouth daily as needed for nausea or vomiting.  . pantoprazole (PROTONIX) 40 MG tablet Take 40 mg by mouth daily.  . potassium chloride (K-DUR) 10 MEQ tablet Take 1 tablet (10 mEq total) by mouth daily.  . predniSONE (DELTASONE) 20 MG tablet Take 1 tablet (20 mg total) by mouth daily with breakfast.  . promethazine (PHENERGAN) 25 MG tablet Take 25 mg by mouth every 6 (six) hours as needed.  . sucralfate (CARAFATE) 1 G tablet Take 1 tablet (1 g total) by mouth 3 (three) times daily with meals as needed.  Marland Kitchen amLODipine (NORVASC) 10 MG tablet Take 1 tablet (10 mg total) by mouth daily.  Marland Kitchen lactulose (CHRONULAC) 10 GM/15ML solution Take 30 mLs (20 g total) by mouth 2 (two) times daily.  . rifaximin (XIFAXAN) 550 MG TABS tablet Take 550 mg by mouth 2 (two) times daily.    Allergies  Allergen Reactions  . Codeine Itching   Past Medical History  Diagnosis Date  . Neuromuscular disorder   . Leg pain, right   . Anxiety   . Rheumatoid arthritis involving multiple joints     rheumatoid arthritis- hands, degeneration of lumbar  spine   . GERD (gastroesophageal reflux disease)   . Mental disorder   . Eye irritation     R eye- redness- from removing contact lense  . Hypertension   . Hyperlipidemia   . Allergy   . Herpes simplex type II infection   . Obesity   . Depression   . Alcoholism    Past Surgical History  Procedure Laterality Date  . Lumbar laminectomy/decompression microdiscectomy  10/18/2011    Procedure: LUMBAR LAMINECTOMY/DECOMPRESSION MICRODISCECTOMY;  Surgeon: Karn Cassis, MD;  Location: [facility];  Service: Neurosurgery;  Laterality: Right;  Redo Right Lumbar four-fivelaminectomy microdiskectomy  . Back surgery      09/2011, first back surgery- 2000    . Eye surgery      lasik procedure very long ago- not successful  . Cholecystectomy      09/2008  . Parotidectomy Left    Review of Systems In addition to the HPI above,  No Fever-chills,  No Headache, No changes with Vision or hearing,  No problems swallowing food or Liquids,  No  productive Cough or Shortness of Breath,  No Abdominal pain, No Nausea or Vomitting, Bowel movements are regular,  No Blood in stool or Urine,  No dysuria,  No new skin rashes or bruises,  No new joints pains-aches,  No new weakness, tingling, numbness in any extremity,  No recent weight loss,  No polyuria, polydypsia or polyphagia,  No significant Mental Stressors.  A full 10 point Review of Systems was done, except as stated above, all other Review of Systems were negative    Objective:   Physical Exam  BP 122/72 mmHg  Pulse 72  Temp(Src) 97.7 F (36.5 C)  Resp 16  Ht 5\' 6"  (1.676 m)  Wt 191 lb (86.637 kg)  BMI 30.84 kg/m2  LMP 06/14/2012  HEENT - Eac's patent. TM's Nl. EOM's  full. PERRLA. NasoOroPharynx clear. Neck - supple. Nl Thyroid. Carotids 2+ & No bruits, nodes, JVD Chest - Clear equal BS w/o Rales, rhonchi, wheezes. Exquisite bilateral lower anterolateral chest wall to palpitation Cor - Nl HS. RRR w/o sig MGR. PP 1(+). No edema. Abd - No palpable organomegaly, masses or tenderness. BS nl. MS- FROM w/o deformities. Muscle power, tone and bulk Nl. Gait Nl. Neuro - No obvious Cr N abnormalities. Sensory, motor and Cerebellar functions appear Nl w/o focal abnormalities. Psyche - Mental status normal & appropriate.  No delusions, ideations or obvious mood abnormalities .   Assessment & Plan:   1. Costochondritis  - Recc trial with heating pad   - recc try increasing prednisone 20 mg bid for 5 days as emperic trial to see if helps chest wall discomfort

## 2014-10-09 NOTE — Patient Instructions (Signed)
May try doubling your Prednisone 20 mg dose   To 2 x day for 5 days   +++++++++++++++++++  Try Heating Pad  ++++++++++++++++   Recommend the book "The END of DIETING" by Dr Monico Hoar   & the book "The END of DIABETES " by Dr Monico Hoar  At [facility].com - get book & Audio CD's     Being diabetic has a  300% increased risk for heart attack, stroke, cancer, and alzheimer- type vascular dementia. It is very important that you work harder with diet by avoiding all foods that are white. Avoid white rice (brown & wild rice is OK), white potatoes (sweetpotatoes in moderation is OK), White bread or wheat bread or anything made out of white flour like bagels, donuts, rolls, buns, biscuits, cakes, pastries, cookies, pizza crust, and pasta (made from white flour & egg whites) - vegetarian pasta or spinach or wheat pasta is OK. Multigrain breads like Arnold's or Pepperidge Farm, or multigrain sandwich thins or flatbreads.  Diet, exercise and weight loss can reverse and cure diabetes in the early stages.  Diet, exercise and weight loss is very important in the control and prevention of complications of diabetes which affects every system in your body, ie. Brain - dementia/stroke, eyes - glaucoma/blindness, heart - heart attack/heart failure, kidneys - dialysis, stomach - gastric paralysis, intestines - malabsorption, nerves - severe painful neuritis, circulation - gangrene & loss of a leg(s), and finally cancer and Alzheimers.    I recommend avoid fried & greasy foods,  sweets/candy, white rice (brown or wild rice or Quinoa is OK), white potatoes (sweet potatoes are OK) - anything made from white flour - bagels, doughnuts, rolls, buns, biscuits,white and wheat breads, pizza crust and traditional pasta made of white flour & egg white(vegetarian pasta or spinach or wheat pasta is OK).  Multi-grain bread is OK - like multi-grain flat bread or sandwich thins. Avoid alcohol in excess. Exercise is also  important.    Eat all the vegetables you want - avoid meat, especially red meat and dairy - especially cheese.  Cheese is the most concentrated form of trans-fats which is the worst thing to clog up our arteries. Veggie cheese is OK which can be found in the fresh produce section at [facility] or [employer] or [employer]  ++++++++++++++++++++++++++

## 2014-10-18 ENCOUNTER — Other Ambulatory Visit: Payer: Self-pay | Admitting: Physician Assistant

## 2014-10-18 DIAGNOSIS — F411 Generalized anxiety disorder: Secondary | ICD-10-CM

## 2014-10-28 ENCOUNTER — Ambulatory Visit (INDEPENDENT_AMBULATORY_CARE_PROVIDER_SITE_OTHER): Payer: Managed Care, Other (non HMO) | Admitting: Internal Medicine

## 2014-10-28 VITALS — BP 120/64 | HR 82 | Temp 97.8°F | Resp 16 | Ht 66.0 in | Wt 190.0 lb

## 2014-10-28 DIAGNOSIS — K769 Liver disease, unspecified: Secondary | ICD-10-CM

## 2014-10-28 DIAGNOSIS — K7682 Hepatic encephalopathy: Secondary | ICD-10-CM

## 2014-10-28 DIAGNOSIS — R443 Hallucinations, unspecified: Secondary | ICD-10-CM

## 2014-10-28 DIAGNOSIS — K7290 Hepatic failure, unspecified without coma: Secondary | ICD-10-CM

## 2014-10-28 MED ORDER — LACTULOSE 10 GM/15ML PO SOLN: 10.0000 g | Freq: Three times a day (TID) | ORAL | Status: DC

## 2014-10-28 MED ORDER — CITALOPRAM HYDROBROMIDE 10 MG PO TABS: 10.0000 mg | ORAL_TABLET | Freq: Every day | ORAL | Status: DC

## 2014-10-28 NOTE — Patient Instructions (Signed)
Please take your blood pressure every day and write down the time you took it, the reading, and the date.  Please do this daily at random times so that we can help to control your blood pressure better.  Please call me if your blood pressure is consistently 160/90 or greater.    Please start taking celexa or citalopram  10 mg every morning with breakfast.  Do not change your dose.  Please start using the liquid lactulose 2-3 times daily.  Call the office if you start to have any more hallucinations.  Do not restart any other medications without our approval.  Do not allow your rheumatologist to restart Arava.

## 2014-10-28 NOTE — Progress Notes (Signed)
Subjective:    Patient ID: [name], female    DOB: [birth year], 49 y.o.   MRN: [id number]  HPI  Patient presents to the office for evaluation of post hospitalization.  She reports that she started having some hallucinations and her husband called the ambulance and when they got to the hospital her blood pressure was very low.  She reports that she was out of it for the first couple days of her hospitalization she continued to be very confused.  She reports that she has been off all of her medications for approximately 3 months now.  Patient reports that her liver doctor took her off of her rifaxin and her lactulose.  She reports that she is now back on the lactulose and is taking it twice daily.  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  She is now off of her prednisone.  She does have an appointment with her rheumatologist tomorrow.  She reports that since being home from the hospital she has not had any type of hallucinations.  She       Review of Systems  Constitutional: Negative for fever, chills and fatigue.  Respiratory: Negative for cough, chest tightness and shortness of breath.   Cardiovascular: Negative for chest pain and palpitations.  Gastrointestinal: Positive for nausea and diarrhea. Negative for vomiting, abdominal pain, constipation, blood in stool, abdominal distention, anal bleeding and rectal pain.  Genitourinary: Negative for dysuria, urgency, frequency, hematuria and difficulty urinating.  Psychiatric/Behavioral: Negative for suicidal ideas, hallucinations, confusion and self-injury. The patient is not nervous/anxious.        Objective:   Physical Exam  Constitutional: She is oriented to person, place, and time. She appears well-developed and well-nourished. No distress.  HENT:  Head: Normocephalic.  Mouth/Throat: Oropharynx is clear  and moist. No oropharyngeal exudate.  Eyes: Conjunctivae and EOM are normal. Pupils are equal, round, and reactive to light. No scleral icterus.  Neck: Normal range of motion. Neck supple. No JVD present. No thyromegaly present.  Cardiovascular: Normal rate, regular rhythm and intact distal pulses.  Exam reveals no gallop and no friction rub.   No murmur heard. Pulmonary/Chest: Effort normal and breath sounds normal. No respiratory distress. She has no wheezes. She has no rales. She exhibits no tenderness.  Abdominal: Soft. Bowel sounds are normal. She exhibits no distension and no mass. There is no tenderness. There is no rebound and no guarding.  Musculoskeletal: Normal range of motion.  Lymphadenopathy:    She has no cervical adenopathy.  Neurological: She is alert and oriented to person, place, and time. She has normal strength. No cranial nerve deficit or sensory deficit. Coordination normal.  Skin: Skin is warm and dry. She is not diaphoretic.  Psychiatric: She has a normal mood and affect. Her behavior is normal. Judgment and thought content normal.  Nursing note and vitals reviewed.  Filed Vitals:   10/28/14 1119  BP: 120/64  Pulse: 82  Temp: 97.8 F (36.6 C)  Resp: 16          Assessment & Plan:    1. Hallucinations -likely secondary to hepatic encephalopathy vs hypotension -patient has had no further hallucinations -cont lactulose  2. Hepatic encephalopathy -appears to be resolved -cont lactulose  3. Liver disease -per GI doctor is likely secondary to alcohol use vs. Hepatic steatosis. -quit drinking -avoid tylenol -do not restart arava  as this has a black box warning for liver impairment and toxicity. -cont lactulose  4. Labile HTN -monitor for 2 weeks of medication.  Restart meds if continued elevation. -currently in normal range.

## 2014-11-10 ENCOUNTER — Ambulatory Visit (INDEPENDENT_AMBULATORY_CARE_PROVIDER_SITE_OTHER): Payer: Managed Care, Other (non HMO) | Admitting: Internal Medicine

## 2014-11-10 ENCOUNTER — Encounter: Payer: Self-pay | Admitting: Internal Medicine

## 2014-11-10 VITALS — BP 158/100 | HR 90 | Temp 98.2°F | Resp 18 | Ht 66.0 in | Wt 187.0 lb

## 2014-11-10 DIAGNOSIS — R7989 Other specified abnormal findings of blood chemistry: Secondary | ICD-10-CM

## 2014-11-10 DIAGNOSIS — I10 Essential (primary) hypertension: Secondary | ICD-10-CM

## 2014-11-10 DIAGNOSIS — R945 Abnormal results of liver function studies: Secondary | ICD-10-CM

## 2014-11-10 LAB — COMPREHENSIVE METABOLIC PANEL
ALK PHOS: 201 U/L — AB (ref 39–117)
ALT: 112 U/L — AB (ref 0–35)
AST: 256 U/L — ABNORMAL HIGH (ref 0–37)
Albumin: 4.3 g/dL (ref 3.5–5.2)
BILIRUBIN TOTAL: 1.2 mg/dL (ref 0.2–1.2)
BUN: 9 mg/dL (ref 6–23)
CO2: 26 mEq/L (ref 19–32)
Calcium: 9.7 mg/dL (ref 8.4–10.5)
Chloride: 99 mEq/L (ref 96–112)
Creat: 0.59 mg/dL (ref 0.50–1.10)
Glucose, Bld: 85 mg/dL (ref 70–99)
Potassium: 4.5 mEq/L (ref 3.5–5.3)
Sodium: 138 mEq/L (ref 135–145)
Total Protein: 7.5 g/dL (ref 6.0–8.3)

## 2014-11-10 MED ORDER — LOSARTAN POTASSIUM 100 MG PO TABS: ORAL_TABLET | ORAL | Status: DC

## 2014-11-10 NOTE — Progress Notes (Signed)
   Subjective:    Patient ID: Michele Cooley, female    DOB: June 25, 1965, 49 y.o.   MRN: [id number]  HPI  Patient is a 49 y.o. Female with history of alcoholic liver disease who presents to the office for recheck of BP. Patient was hospitalized several weeks ago from hallucinations and was found to be hypotensive.  She was taken off atenolol and losartan.  At her last OV her BP was normal.  She reports that since she was last here her BP has gone way up.  She has had no HA, vision changes, chest pain, SOB, no urinary issues.    Review of Systems  Constitutional: Negative for fever, chills and fatigue.  Respiratory: Negative for chest tightness and shortness of breath.   Cardiovascular: Negative for chest pain and palpitations.  Gastrointestinal: Negative for nausea and vomiting.  Neurological: Negative for dizziness, light-headedness and headaches.       Objective:   Physical Exam  Constitutional: She is oriented to person, place, and time. She appears well-developed and well-nourished. No distress.  HENT:  Head: Normocephalic and atraumatic.  Mouth/Throat: Oropharynx is clear and moist. No oropharyngeal exudate.  Eyes: Conjunctivae are normal. No scleral icterus.  Neck: Normal range of motion. Neck supple. No JVD present. No thyromegaly present.  Cardiovascular: Normal rate, regular rhythm, normal heart sounds and intact distal pulses.   Pulmonary/Chest: Effort normal and breath sounds normal. No respiratory distress. She has no wheezes. She has no rales. She exhibits no tenderness.  Abdominal: Soft. Bowel sounds are normal. She exhibits no distension and no mass. There is no tenderness. There is no rebound and no guarding.  Musculoskeletal: Normal range of motion.  Lymphadenopathy:    She has no cervical adenopathy.  Neurological: She is alert and oriented to person, place, and time.  Skin: Skin is warm and dry. She is not diaphoretic.  Psychiatric: She has a normal mood and affect.  Her behavior is normal. Judgment and thought content normal.  Nursing note and vitals reviewed.   Filed Vitals:   11/10/14 1401  BP: 158/100  Pulse: 90  Temp: 98.2 F (36.8 C)  Resp: 18          Assessment & Plan:    1. Essential hypertension -monitor daily and keep log -call office if consistently greater than 160/90 -recheck in 2 weeks - Comprehensive metabolic panel - losartan (COZAAR) 100 MG tablet; TAKE 1 TABLET (100 MG TOTAL) BY MOUTH DAILY.  Dispense: 30 tablet; Refill: 3  2. Elevated liver function tests  - Comprehensive metabolic panel

## 2014-11-10 NOTE — Patient Instructions (Signed)
Please start taking your Losartan daily at the same time.  Please keep a BP log daily over the next two weeks until we see you back.  If you BP stays consistently over 160/90 please call us and let us know.

## 2014-11-14 ENCOUNTER — Encounter: Payer: Self-pay | Admitting: Internal Medicine

## 2014-11-17 ENCOUNTER — Encounter: Payer: Self-pay | Admitting: Internal Medicine

## 2014-11-24 ENCOUNTER — Ambulatory Visit: Payer: Self-pay | Admitting: Internal Medicine

## 2014-12-26 ENCOUNTER — Ambulatory Visit (INDEPENDENT_AMBULATORY_CARE_PROVIDER_SITE_OTHER): Payer: Managed Care, Other (non HMO) | Admitting: Physician Assistant

## 2014-12-26 ENCOUNTER — Encounter: Payer: Self-pay | Admitting: Physician Assistant

## 2014-12-26 VITALS — BP 150/92 | HR 72 | Temp 97.6°F | Resp 16 | Ht 66.0 in | Wt 186.0 lb

## 2014-12-26 DIAGNOSIS — Z79899 Other long term (current) drug therapy: Secondary | ICD-10-CM

## 2014-12-26 DIAGNOSIS — E669 Obesity, unspecified: Secondary | ICD-10-CM

## 2014-12-26 DIAGNOSIS — Z1339 Encounter for screening examination for other mental health and behavioral disorders: Secondary | ICD-10-CM

## 2014-12-26 DIAGNOSIS — K7400 Hepatic fibrosis, unspecified: Secondary | ICD-10-CM | POA: Insufficient documentation

## 2014-12-26 DIAGNOSIS — I10 Essential (primary) hypertension: Secondary | ICD-10-CM

## 2014-12-26 DIAGNOSIS — K760 Fatty (change of) liver, not elsewhere classified: Secondary | ICD-10-CM

## 2014-12-26 DIAGNOSIS — Z1389 Encounter for screening for other disorder: Secondary | ICD-10-CM

## 2014-12-26 DIAGNOSIS — E785 Hyperlipidemia, unspecified: Secondary | ICD-10-CM

## 2014-12-26 DIAGNOSIS — E559 Vitamin D deficiency, unspecified: Secondary | ICD-10-CM

## 2014-12-26 LAB — LIPID PANEL
Cholesterol: 227 mg/dL — ABNORMAL HIGH (ref 125–200)
HDL: 80 mg/dL (ref >=46)
LDL Cholesterol: 120 mg/dL (ref <130)
Total CHOL/HDL Ratio: 2.8 Ratio (ref <=5.0)
Triglycerides: 135 mg/dL (ref <150)
VLDL: 27 mg/dL (ref <30)

## 2014-12-26 LAB — HEPATIC FUNCTION PANEL
ALBUMIN: 4 g/dL (ref 3.6–5.1)
ALT: 39 U/L — AB (ref 6–29)
AST: 55 U/L — AB (ref 10–35)
Alkaline Phosphatase: 109 U/L (ref 33–130)
BILIRUBIN INDIRECT: 0.4 mg/dL (ref 0.2–1.2)
BILIRUBIN TOTAL: 0.5 mg/dL (ref 0.2–1.2)
Bilirubin, Direct: 0.1 mg/dL (ref <=0.2)
Total Protein: 6.8 g/dL (ref 6.1–8.1)

## 2014-12-26 LAB — CBC WITH DIFFERENTIAL/PLATELET
BASOS ABS: 0.1 10*3/uL (ref 0.0–0.1)
Basophils Relative: 2 % — ABNORMAL HIGH (ref 0–1)
Eosinophils Absolute: 0.1 10*3/uL (ref 0.0–0.7)
Eosinophils Relative: 2 % (ref 0–5)
HEMATOCRIT: 36.4 % (ref 36.0–46.0)
Hemoglobin: 12.6 g/dL (ref 12.0–15.0)
Lymphocytes Relative: 13 % (ref 12–46)
Lymphs Abs: 0.7 10*3/uL (ref 0.7–4.0)
MCH: 34.8 pg — ABNORMAL HIGH (ref 26.0–34.0)
MCHC: 34.6 g/dL (ref 30.0–36.0)
MCV: 100.6 fL — ABNORMAL HIGH (ref 78.0–100.0)
MONO ABS: 0.7 10*3/uL (ref 0.1–1.0)
MONOS PCT: 13 % — AB (ref 3–12)
MPV: 9.6 fL (ref 8.6–12.4)
Neutro Abs: 3.8 10*3/uL (ref 1.7–7.7)
Neutrophils Relative %: 70 % (ref 43–77)
Platelets: 227 10*3/uL (ref 150–400)
RBC: 3.62 MIL/uL — AB (ref 3.87–5.11)
RDW: 15.1 % (ref 11.5–15.5)
WBC: 5.4 10*3/uL (ref 4.0–10.5)

## 2014-12-26 LAB — MAGNESIUM: Magnesium: 1.4 mg/dL — ABNORMAL LOW (ref 1.5–2.5)

## 2014-12-26 LAB — BASIC METABOLIC PANEL WITH GFR
BUN: 14 mg/dL (ref 7–25)
CO2: 25 mmol/L (ref 20–31)
Calcium: 8.9 mg/dL (ref 8.6–10.4)
Chloride: 101 mmol/L (ref 98–110)
Creat: 0.6 mg/dL (ref 0.50–1.05)
GFR, Est African American: >89 mL/min (ref >=60)
GFR, Est Non African American: >89 mL/min (ref >=60)
Glucose, Bld: 84 mg/dL (ref 65–99)
Potassium: 4.1 mmol/L (ref 3.5–5.3)
SODIUM: 137 mmol/L (ref 135–146)

## 2014-12-26 LAB — TSH: TSH: 0.769 u[IU]/mL (ref 0.350–4.500)

## 2014-12-26 MED ORDER — ATENOLOL 100 MG PO TABS: 100.0000 mg | ORAL_TABLET | Freq: Every day | ORAL | Status: DC

## 2014-12-26 MED ORDER — AMLODIPINE BESYLATE 5 MG PO TABS: 5.0000 mg | ORAL_TABLET | Freq: Every day | ORAL | Status: DC

## 2014-12-26 NOTE — Progress Notes (Addendum)
Assessment and Plan:  1. Hypertension -Continue medication, continue atenolol 100mg  BId, losartan and add norvasc 5 mg in the AM, monitor blood pressure at home. Continue DASH diet.  Reminder to go to the ER if any CP, SOB, nausea, dizziness, severe HA, changes vision/speech, left arm numbness and tingling and jaw pain.  2. Cholesterol -Continue diet and exercise. Check cholesterol. Would like to start on a statin due to heapatic steatosis however would like to initiate with some normalization of her LFTs.   3. Prediabetes  -Continue diet and exercise. Check A1C  4. Vitamin D Def - check level and continue medications.   5. Liver failure/cirrhosis Consistent with alcoholic hepatitis though patient denies she is not drinking, breath does smell of alcohol, will check blood level, + hepatic steatosis, emphasized weight loss, will monitor closely.   Continue diet and meds as discussed. Further disposition pending results of labs. Over 30 minutes of exam, counseling, chart review, and critical decision making was performed Follow up 1 month BP check  Addendum: LFTs better, will start very low dose of lipitor 10mg  daily, follow up 1 month OV to check cholesterol, LFTs and discuss labs including alcohol level which was positive and dicuss treatment options.   HPI 49 y.o. female  presents for 3 month follow up on hypertension, cholesterol, prediabetes, and vitamin D deficiency.   Her blood pressure has not been controlled at home, she is on losartan 100mg  daily and has been taking atenolol 100 mg BID which she was only suppose to be on once a day, today their BP is BP: (!) 150/92 mmHg  She does not workout. She denies chest pain, shortness of breath, dizziness.  She is not on cholesterol medication and denies myalgias. Her cholesterol is not at goal. The cholesterol last visit was:   Lab Results  Component Value Date   CHOL 350* 09/22/2014   HDL 118 09/22/2014   LDLCALC 200* 09/22/2014    TRIG 159* 09/22/2014   CHOLHDL 3.0 09/22/2014   Last 09/24/2014 in the office was:  Lab Results  Component Value Date   HGBA1C 5.3 08/21/2013   Patient is on Vitamin D supplement.   Lab Results  Component Value Date   VD25OH 26* 09/22/2014     She has RA and is back on her plaquenil.  She has a history of depression, she is on celexa 20mg  daily that helps.  She has seen GI for cirrhosis of her liver with history of hallucinations, after the biopsy it was thought to be due to steatosis and alcohol. She is now off the lactulose, denies hallucinations. She does have decrease appetite, with some nausea.  Lab Results  Component Value Date   ALT 112* 11/10/2014   AST 256* 11/10/2014   ALKPHOS 201* 11/10/2014   BILITOT 1.2 11/10/2014   BMI is Body mass index is 30.04 kg/(m^2)., she is working on diet and exercise. Wt Readings from Last 3 Encounters:  12/26/14 186 lb (84.369 kg)  11/10/14 187 lb (84.823 kg)  10/28/14 190 lb (86.183 kg)     Current Medications:  Current Outpatient Prescriptions on File Prior to Visit  Medication Sig Dispense Refill  . ALPRAZolam (XANAX) 0.5 MG tablet TAKE 1 TABLET BY MOUTH 3 TIMES A DAY AS NEEDED 90 tablet 5  . folic acid (FOLVITE) 1 MG tablet Take 1 mg by mouth daily.    . hydroxypropyl methylcellulose / hypromellose (ISOPTO TEARS / GONIOVISC) 2.5 % ophthalmic solution Place 1 drop into both eyes  3 (three) times daily as needed for dry eyes.    Marland Kitchen losartan (COZAAR) 100 MG tablet TAKE 1 TABLET (100 MG TOTAL) BY MOUTH DAILY. 30 tablet 3  . ondansetron (ZOFRAN) 4 MG tablet Take 1 tablet (4 mg total) by mouth daily as needed for nausea or vomiting. 30 tablet 1  . promethazine (PHENERGAN) 25 MG tablet Take 25 mg by mouth every 6 (six) hours as needed.     No current facility-administered medications on file prior to visit.   Medical History:  Past Medical History  Diagnosis Date  . Neuromuscular disorder   . Leg pain, right   . Anxiety   . Rheumatoid  arthritis involving multiple joints     rheumatoid arthritis- hands, degeneration of lumbar  spine   . GERD (gastroesophageal reflux disease)   . Mental disorder   . Eye irritation     R eye- redness- from removing contact lense  . Hypertension   . Hyperlipidemia   . Allergy   . Herpes simplex type II infection   . Obesity   . Depression   . Alcoholism    Allergies:  Allergies  Allergen Reactions  . Codeine Itching     Review of Systems:  Review of Systems  Constitutional: Negative.  Negative for fever and chills.  HENT: Negative.   Respiratory: Negative.  Negative for cough and shortness of breath.   Cardiovascular: Negative.  Negative for chest pain and palpitations.  Gastrointestinal: Positive for nausea. Negative for heartburn, vomiting, abdominal pain, diarrhea, constipation, blood in stool and melena.  Genitourinary: Negative for dysuria, urgency, frequency and hematuria.  Skin: Negative.   Neurological: Negative.  Negative for dizziness and tremors.  Endo/Heme/Allergies: Does not bruise/bleed easily.  Psychiatric/Behavioral: Negative for suicidal ideas and hallucinations. The patient is not nervous/anxious.     Family history- Review and unchanged Social history- Review and unchanged Physical Exam: BP 150/92 mmHg  Pulse 72  Temp(Src) 97.6 F (36.4 C) (Temporal)  Resp 16  Ht 5\' 6"  (1.676 m)  Wt 186 lb (84.369 kg)  BMI 30.04 kg/m2  LMP 06/14/2012 Wt Readings from Last 3 Encounters:  12/26/14 186 lb (84.369 kg)  11/10/14 187 lb (84.823 kg)  10/28/14 190 lb (86.183 kg)   General Appearance: Well nourished, in no apparent distress. Eyes: PERRLA, EOMs, conjunctiva no swelling or erythema Sinuses: No Frontal/maxillary tenderness ENT/Mouth: Ext aud canals clear, TMs without erythema, bulging. No erythema, swelling, or exudate on post pharynx.  Tonsils not swollen or erythematous. Hearing normal. Breath smells of alcohol Neck: Supple, thyroid normal.   Respiratory: Respiratory effort normal, BS equal bilaterally without rales, rhonchi, wheezing or stridor.  Cardio: RRR with no MRGs. Brisk peripheral pulses without edema.  Abdomen: Soft, obese, + BS,  Non tender, no guarding, rebound, hernias, masses. Lymphatics: Non tender without lymphadenopathy.  Musculoskeletal: Full ROM, 5/5 strength, Normal gait Skin: Warm, dry without rashes, lesions, ecchymosis.  Neuro: Cranial nerves intact. Normal muscle tone, no cerebellar symptoms. Psych: Awake and oriented X 3, normal affect, Insight and Judgment appropriate.    10/30/14, PA-C 10:32 AM [facility]

## 2014-12-26 NOTE — Patient Instructions (Signed)
Do atenolol 100 once a day with losartan once a day, and add on novvasc 5 mg once a day, if you get leg swelling call the office.   We want weight loss that will last so you should lose 1-2 pounds a week.  THAT IS IT! Please pick THREE things a month to change. Once it is a habit check off the item. Then pick another three items off the list to become habits.  If you are already doing a habit on the list GREAT!  Cross that item off! o Don't drink your calories. Ie, alcohol, soda, fruit juice, and sweet tea.  o Drink more water. Drink a glass when you feel hungry or before each meal.  o Eat breakfast - Complex carb and protein (likeDannon light and fit yogurt, oatmeal, fruit, eggs, [place] bacon). o Measure your cereal.  Eat no more than one cup a day. (ie [place]) o Eat an apple a day. o Add a vegetable a day. o Try a new vegetable a month. o Use Pam! Stop using oil or butter to cook. o Don't finish your plate or use smaller plates. o Share your dessert. o Eat sugar free Jello for dessert or frozen grapes. o Don't eat 2-3 hours before bed. o Switch to whole wheat bread, pasta, and brown rice. o Make healthier choices when you eat out. No fries! o Pick baked chicken, NOT fried. o Don't forget to SLOW DOWN when you eat. It is not going anywhere.  o Take the stairs. o Park far away in the parking lot o [employer] (or weights) for 10 minutes while watching TV. o Walk at work for 10 minutes during break. o Walk outside 1 time a week with your friend, kids, dog, or significant other. o Start a walking group at church. o Walk the mall as much as you can tolerate.  o Keep a food diary. o Weigh yourself daily. o Walk for 15 minutes 3 days per week. o Cook at home more often and eat out less.  If life happens and you go back to old habits, it is okay.  Just start over. You can do it!   If you experience chest pain, get short of breath, or tired during the exercise, please stop immediately and  inform your doctor.   Before you even begin to attack a weight-loss plan, it pays to remember this: You are not fat. You have fat. Losing weight isn't about blame or shame; it's simply another achievement to accomplish. Dieting is like any other skill-you have to buckle down and work at it. As long as you act in a smart, reasonable way, you'll ultimately get where you want to be. Here are some weight loss pearls for you.  1. It's Not a Diet. It's a Lifestyle Thinking of a diet as something you're on and suffering through only for the short term doesn't work. To shed weight and keep it off, you need to make permanent changes to the way you eat. It's OK to indulge occasionally, of course, but if you cut calories temporarily and then revert to your old way of eating, you'll gain back the weight quicker than you can say yo-yo. Use it to lose it. Research shows that one of the best predictors of long-term weight loss is how many pounds you drop in the first month. For that reason, nutritionists often suggest being stricter for the first two weeks of your new eating strategy to build momentum. Cut  out added sugar and alcohol and avoid unrefined carbs. After that, figure out how you can reincorporate them in a way that's healthy and maintainable.  2. There's a Right Way to Exercise Working out burns calories and fat and boosts your metabolism by building muscle. But those trying to lose weight are notorious for overestimating the number of calories they burn and underestimating the amount they take in. Unfortunately, your system is biologically programmed to hold on to extra pounds and that means when you start exercising, your body senses the deficit and ramps up its hunger signals. If you're not diligent, you'll eat everything you burn and then some. Use it to lose it. Cardio gets all the exercise glory, but strength and interval training are the real heroes. They help you build lean muscle, which in turn  increases your metabolism and calorie-burning ability 3. Don't Overreact to Mild Hunger Some people have a hard time losing weight because of hunger anxiety. To them, being hungry is bad-something to be avoided at all costs-so they carry snacks with them and eat when they don't need to. Others eat because they're stressed out or bored. While you never want to get to the point of being ravenous (that's when bingeing is likely to happen), a hunger pang, a craving, or the fact that it's 3:00 p.m. should not send you racing for the vending machine or obsessing about the energy bar in your purse. Ideally, you should put off eating until your stomach is growling and it's difficult to concentrate.  Use it to lose it. When you feel the urge to eat, use the HALT method. Ask yourself, Am I really hungry? Or am I angry or anxious, lonely or bored, or tired? If you're still not certain, try the apple test. If you're truly hungry, an apple should seem delicious; if it doesn't, something else is going on. Or you can try drinking water and making yourself busy, if you are still hungry try a healthy snack.  4. Not All Calories Are Created Equal The mechanics of weight loss are pretty simple: Take in fewer calories than you use for energy. But the kind of food you eat makes all the difference. Processed food that's high in saturated fat and refined starch or sugar can cause inflammation that disrupts the hormone signals that tell your brain you're full. The result: You eat a lot more.  Use it to lose it. Clean up your diet. Swap in whole, unprocessed foods, including vegetables, lean protein, and healthy fats that will fill you up and give you the biggest nutritional bang for your calorie buck. In a few weeks, as your brain starts receiving regular hunger and fullness signals once again, you'll notice that you feel less hungry overall and naturally start cutting back on the amount you eat.  5. Protein, Produce, and  Plant-Based Fats Are Your Weight-Loss Trinity Here's why eating the three Ps regularly will help you drop pounds. Protein fills you up. You need it to build lean muscle, which keeps your metabolism humming so that you can torch more fat. People in a weight-loss program who ate double the recommended daily allowance for protein (about 110 grams for a 150-pound woman) lost 70 percent of their weight from fat, while people who ate the RDA lost only about 40 percent, one study found. Produce is packed with filling fiber. "It's very difficult to consume too many calories if you're eating a lot of vegetables. Example: Three cups of broccoli is a  lot of food, yet only 93 calories. (Fruit is another story. It can be easy to overeat and can contain a lot of calories from sugar, so be sure to monitor your intake.) Plant-based fats like olive oil and those in avocados and nuts are healthy and extra satiating.  Use it to lose it. Aim to incorporate each of the three Ps into every meal and snack. People who eat protein throughout the day are able to keep weight off, according to a study in the American Journal of Clinical Nutrition. In addition to meat, poultry and seafood, good sources are beans, lentils, eggs, tofu, and yogurt. As for fat, keep portion sizes in check by measuring out salad dressing, oil, and nut butters (shoot for one to two tablespoons). Finally, eat veggies or a little fruit at every meal. People who did that consumed 308 fewer calories but didn't feel any hungrier than when they didn't eat more produce.  7. How You Eat Is As Important As What You Eat In order for your brain to register that you're full, you need to focus on what you're eating. Sit down whenever you eat, preferably at a table. Turn off the TV or computer, put down your phone, and look at your food. Smell it. Chew slowly, and don't put another bite on your fork until you swallow. When women ate lunch this attentively, they consumed 30  percent less when snacking later than those who listened to an audiobook at lunchtime, according to a study in the [place] Journal of Nutrition. 8. Weighing Yourself Really Works The scale provides the best evidence about whether your efforts are paying off. Seeing the numbers tick up or down or stagnate is motivation to keep going-or to rethink your approach. A 2015 study at [facility] found that daily weigh-ins helped people lose more weight, keep it off, and maintain that loss, even after two years. Use it to lose it. Step on the scale at the same time every day for the best results. If your weight shoots up several pounds from one weigh-in to the next, don't freak out. Eating a lot of salt the night before or having your period is the likely culprit. The number should return to normal in a day or two. It's a steady climb that you need to do something about. 9. Too Much Stress and Too Little Sleep Are Your Enemies When you're tired and frazzled, your body cranks up the production of cortisol, the stress hormone that can cause carb cravings. Not getting enough sleep also boosts your levels of ghrelin, a hormone associated with hunger, while suppressing leptin, a hormone that signals fullness and satiety. People on a diet who slept only five and a half hours a night for two weeks lost 55 percent less fat and were hungrier than those who slept eight and a half hours, according to a study in the [place] [employer] Journal. Use it to lose it. Prioritize sleep, aiming for seven hours or more a night, which research shows helps lower stress. And make sure you're getting quality zzz's. If a snoring spouse or a fidgety cat wakes you up frequently throughout the night, you may end up getting the equivalent of just four hours of sleep, according to a study from [facility]. Keep pets out of the bedroom, and use a white-noise app to drown out snoring. 10. You Will Hit a plateau-And You Can  Bust Through It As you slim down, your body releases much less  leptin, the fullness hormone.  If you're not strength training, start right now. Building muscle can raise your metabolism to help you overcome a plateau. To keep your body challenged and burning calories, incorporate new moves and more intense intervals into your workouts or add another sweat session to your weekly routine. Alternatively, cut an extra 100 calories or so a day from your diet. Now that you've lost weight, your body simply doesn't need as much fuel.    Fatty Liver Fatty liver is the accumulation of fat in liver cells. It is also called hepatosteatosis or steatohepatitis. It is normal for your liver to contain some fat. If fat is more than 5 to 10% of your liver's weight, you have fatty liver.  There are often no symptoms (problems) for years while damage is still occurring. People often learn about their fatty liver when they have medical tests for other reasons. Fat can damage your liver for years or even decades without causing problems. When it becomes severe, it can cause fatigue, weight loss, weakness, and confusion. This makes you more likely to develop more serious liver problems. The liver is the largest organ in the body. It does a lot of work and often gives no warning signs when it is sick until late in a disease. The liver has many important jobs including:  Breaking down foods.  Storing vitamins, iron, and other minerals.  Making proteins.  Making bile for food digestion.  Breaking down many products including medications, alcohol and some poisons.  PROGNOSIS  Fatty liver may cause no damage or it can lead to an inflammation of the liver. This is, called steatohepatitis.  Over time the liver may become scarred and hardened. This condition is called cirrhosis. Cirrhosis is serious and may lead to liver failure or cancer. NASH is one of the leading causes of cirrhosis. About 10-20% of Americans have fatty  liver and a smaller 2-5% has NASH.  TREATMENT   Weight loss, fat restriction, and exercise in overweight patients produces inconsistent results but is worth trying.  Good control of diabetes may reduce fatty liver.  Eat a balanced, healthy diet.  Increase your physical activity.  There are no medical or surgical treatments for a fatty liver or NASH, but improving your diet and increasing your exercise may help prevent or reverse some of the damage.

## 2014-12-27 LAB — VITAMIN D 25 HYDROXY (VIT D DEFICIENCY, FRACTURES): VIT D 25 HYDROXY: 39 ng/mL (ref 30–100)

## 2014-12-27 LAB — ETHANOL: ALCOHOL ETHYL (B): 27 mg/dL — AB (ref 0–10)

## 2014-12-29 ENCOUNTER — Other Ambulatory Visit: Payer: Self-pay | Admitting: Internal Medicine

## 2014-12-29 MED ORDER — ATORVASTATIN CALCIUM 10 MG PO TABS: 10.0000 mg | ORAL_TABLET | Freq: Every day | ORAL | Status: DC

## 2014-12-29 NOTE — Addendum Note (Signed)
Addended by: Quentin Mulling R on: 12/29/2014 09:17 AM   Modules accepted: Orders

## 2015-01-23 ENCOUNTER — Ambulatory Visit: Payer: Self-pay | Admitting: Physician Assistant

## 2015-01-30 ENCOUNTER — Ambulatory Visit: Payer: Self-pay | Admitting: Physician Assistant

## 2015-02-13 ENCOUNTER — Ambulatory Visit: Payer: Self-pay | Admitting: Physician Assistant

## 2015-02-17 ENCOUNTER — Encounter: Payer: Self-pay | Admitting: Physician Assistant

## 2015-02-17 ENCOUNTER — Ambulatory Visit (INDEPENDENT_AMBULATORY_CARE_PROVIDER_SITE_OTHER): Payer: Managed Care, Other (non HMO) | Admitting: Physician Assistant

## 2015-02-17 VITALS — BP 160/100 | HR 81 | Temp 97.5°F | Resp 16 | Wt 187.0 lb

## 2015-02-17 DIAGNOSIS — K7290 Hepatic failure, unspecified without coma: Secondary | ICD-10-CM

## 2015-02-17 DIAGNOSIS — Z79899 Other long term (current) drug therapy: Secondary | ICD-10-CM | POA: Diagnosis not present

## 2015-02-17 DIAGNOSIS — I10 Essential (primary) hypertension: Secondary | ICD-10-CM

## 2015-02-17 DIAGNOSIS — K760 Fatty (change of) liver, not elsewhere classified: Secondary | ICD-10-CM

## 2015-02-17 DIAGNOSIS — K7682 Hepatic encephalopathy: Secondary | ICD-10-CM

## 2015-02-17 DIAGNOSIS — Z9119 Patient's noncompliance with other medical treatment and regimen: Secondary | ICD-10-CM | POA: Diagnosis not present

## 2015-02-17 DIAGNOSIS — Z91199 Patient's noncompliance with other medical treatment and regimen due to unspecified reason: Secondary | ICD-10-CM

## 2015-02-17 DIAGNOSIS — E785 Hyperlipidemia, unspecified: Secondary | ICD-10-CM | POA: Diagnosis not present

## 2015-02-17 LAB — CBC WITH DIFFERENTIAL/PLATELET
BASOS ABS: 0.1 10*3/uL (ref 0.0–0.1)
BASOS PCT: 1 % (ref 0–1)
Eosinophils Absolute: 0 10*3/uL (ref 0.0–0.7)
Eosinophils Relative: 0 % (ref 0–5)
HCT: 37 % (ref 36.0–46.0)
HEMOGLOBIN: 12.6 g/dL (ref 12.0–15.0)
Lymphocytes Relative: 5 % — ABNORMAL LOW (ref 12–46)
Lymphs Abs: 0.3 10*3/uL — ABNORMAL LOW (ref 0.7–4.0)
MCH: 32.9 pg (ref 26.0–34.0)
MCHC: 34.1 g/dL (ref 30.0–36.0)
MCV: 96.6 fL (ref 78.0–100.0)
MONOS PCT: 3 % (ref 3–12)
MPV: 10 fL (ref 8.6–12.4)
Monocytes Absolute: 0.2 10*3/uL (ref 0.1–1.0)
NEUTROS ABS: 5.3 10*3/uL (ref 1.7–7.7)
Neutrophils Relative %: 91 % — ABNORMAL HIGH (ref 43–77)
PLATELETS: 240 10*3/uL (ref 150–400)
RBC: 3.83 MIL/uL — AB (ref 3.87–5.11)
RDW: 14.6 % (ref 11.5–15.5)
WBC: 5.8 10*3/uL (ref 4.0–10.5)

## 2015-02-17 MED ORDER — AMLODIPINE BESYLATE 10 MG PO TABS: 10.0000 mg | ORAL_TABLET | Freq: Every day | ORAL | Status: DC

## 2015-02-17 NOTE — Progress Notes (Signed)
Assessment and Plan: Alcoholic hepatitis- recheck liver function- suggest AA/counseling, will check thiamine.  BP- increase norvasc to 10mg  daily, continue cozaar/atenolol 100mg , monitor BP at home, call or message 1 week if still elevated and we will increase atenolol.  Hypomagnesium- check levels.  Hyperlipidemia- not on lipitor yet, if LFts still good suggest getting on low dose  HPI 49 y.o. WF with history of alcoholic liver disease, RA, HTN, chol  presents for follow up for BP. She was taken off her Bp medications during an ER visit due to hypotension, she states she is back on her atenolol 100, norvasc 5, and cozaar 100mg . She states her BP is still elevated at home, she is taking her medications consistently for last few days. She admits to before that she has been inconsistent/poor with medication compliance.    She has history of hepatic steatosis and alcoholic liver disease, last visit low dose of lipitor 10mg  daily was added due to elevated cholesterol and normalizations of her liver function but she states she has not started it.   In July she also had a blood alcohol level that was elevated despite states she was not drinking. 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  At that visit she had low mag and low phos. Has not had thiamine checked. She is on rifaximin from medical center. She states she has not drank in 2 months, she is not getting counseling/outside treatment.   BP Readings from Last 3 Encounters:  02/17/15 160/100  12/26/14 150/92  11/10/14 158/100    Lab Results  Component Value Date   ETH 27* 12/26/2014    Lab Results  Component Value Date   AST 55* 12/26/2014   AST 256* 11/10/2014   AST 69* 09/22/2014   ALT 39* 12/26/2014   ALT 112* 11/10/2014   ALT 47* 09/22/2014   Lab Results  Component Value Date   CHOL 227* 12/26/2014   HDL 80 12/26/2014   LDLCALC 120 12/26/2014   TRIG 135  12/26/2014   CHOLHDL 2.8 12/26/2014    Past Medical History  Diagnosis Date  . Neuromuscular disorder   . Leg pain, right   . Anxiety   . Rheumatoid arthritis involving multiple joints     rheumatoid arthritis- hands, degeneration of lumbar  spine   . GERD (gastroesophageal reflux disease)   . Mental disorder   . Eye irritation     R eye- redness- from removing contact lense  . Hypertension   . Hyperlipidemia   . Allergy   . Herpes simplex type II infection   . Obesity   . Depression   . Alcoholism      Allergies  Allergen Reactions  . Codeine Itching     Current Outpatient Prescriptions on File Prior to Visit  Medication Sig Dispense Refill  . ALPRAZolam (XANAX) 0.5 MG tablet TAKE 1 TABLET BY MOUTH 3 TIMES A DAY AS NEEDED 90 tablet 5  . amLODipine (NORVASC) 5 MG tablet Take 1 tablet (5 mg total) by mouth daily. 30 tablet 2  . atenolol (TENORMIN) 100 MG tablet Take 1 tablet (100 mg total) by mouth daily. 90 tablet 0  . atorvastatin (LIPITOR) 10 MG tablet Take 1 tablet (10 mg total) by mouth daily. 30 tablet 11  . citalopram (CELEXA) 40 MG tablet Take 40 mg by mouth daily.    . folic acid (FOLVITE) 1 MG tablet Take 1 mg by mouth daily.    12/28/2014  hydroxychloroquine (PLAQUENIL) 200 MG tablet Take 200 mg by mouth 2 (two) times daily.    . hydroxypropyl methylcellulose / hypromellose (ISOPTO TEARS / GONIOVISC) 2.5 % ophthalmic solution Place 1 drop into both eyes 3 (three) times daily as needed for dry eyes.    Marland Kitchen losartan (COZAAR) 100 MG tablet TAKE 1 TABLET (100 MG TOTAL) BY MOUTH DAILY. 30 tablet 3  . ondansetron (ZOFRAN) 4 MG tablet Take 1 tablet (4 mg total) by mouth daily as needed for nausea or vomiting. 30 tablet 1  . promethazine (PHENERGAN) 25 MG tablet Take 25 mg by mouth every 6 (six) hours as needed.    . sucralfate (CARAFATE) 1 G tablet Take 1 g by mouth 4 (four) times daily -  with meals and at bedtime.     No current facility-administered medications on file prior to  visit.    ROS: all negative except above.   Physical Exam: Filed Weights   02/17/15 1528  Weight: 187 lb (84.823 kg)   BP 160/100 mmHg  Pulse 81  Temp(Src) 97.5 F (36.4 C)  Resp 16  Wt 187 lb (84.823 kg)  LMP 06/14/2012 General Appearance: Well nourished, in no apparent distress, diaphoretic Eyes: PERRLA, EOMs, conjunctiva no swelling or erythema Sinuses: No Frontal/maxillary tenderness ENT/Mouth: Ext aud canals clear, TMs without erythema, bulging. No erythema, swelling, or exudate on post pharynx.  Tonsils not swollen or erythematous. Hearing normal.  Neck: Supple, thyroid normal.  Respiratory: Respiratory effort normal, BS equal bilaterally without rales, rhonchi, wheezing or stridor.  Cardio: RRR with no MRGs. Brisk peripheral pulses without edema.  Abdomen: Soft, + BS, obese,  Non tender, no guarding, rebound, hernias, masses. Lymphatics: Non tender without lymphadenopathy.  Musculoskeletal: Full ROM, 5/5 strength, normal gait.  Skin: Warm, dry without rashes, lesions, ecchymosis.  Neuro: Cranial nerves intact. Normal muscle tone, no cerebellar symptoms.  Psych: Awake and oriented X 3, normal affect, Insight and Judgment appropriate.     Quentin Mulling, PA-C 3:28 PM [facility]

## 2015-02-17 NOTE — Patient Instructions (Signed)
increase norvasc to 10mg  daily, continue cozaar/atenolol 100mg , monitor BP at home, call or message 1 week if still elevated above 150/90 and we will increase atenolol.   Monitor your blood pressure at home, if it is above 140/90 consistently call the office so we can adjust your medications. Go to the ER if any CP, SOB, nausea, dizziness, severe HA, changes vision/speech  DASH Eating Plan DASH stands for "Dietary Approaches to Stop Hypertension." The DASH eating plan is a healthy eating plan that has been shown to reduce high blood pressure (hypertension). Additional health benefits may include reducing the risk of type 2 diabetes mellitus, heart disease, and stroke. The DASH eating plan may also help with weight loss. WHAT DO I NEED TO KNOW ABOUT THE DASH EATING PLAN? For the DASH eating plan, you will follow these general guidelines:  Choose foods with a percent daily value for sodium of less than 5% (as listed on the food label).  Use salt-free seasonings or herbs instead of table salt or sea salt.  Check with your health care provider or pharmacist before using salt substitutes.  Eat lower-sodium products, often labeled as "lower sodium" or "no salt added."  Eat fresh foods.  Eat more vegetables, fruits, and low-fat dairy products.  Choose whole grains. Look for the word "whole" as the first word in the ingredient list.  Choose fish and skinless chicken or [place] more often than red meat. Limit fish, poultry, and meat to 6 oz (170 g) each day.  Limit sweets, desserts, sugars, and sugary drinks.  Choose heart-healthy fats.  Limit cheese to 1 oz (28 g) per day.  Eat more home-cooked food and less restaurant, buffet, and fast food.  Limit fried foods.  Cook foods using methods other than frying.  Limit canned vegetables. If you do use them, rinse them well to decrease the sodium.  When eating at a restaurant, ask that your food be prepared with less salt, or no salt if  possible. WHAT FOODS CAN I EAT? Seek help from a dietitian for individual calorie needs. Grains Whole grain or whole wheat bread. Brown rice. Whole grain or whole wheat pasta. Quinoa, bulgur, and whole grain cereals. Low-sodium cereals. Corn or whole wheat flour tortillas. Whole grain cornbread. Whole grain crackers. Low-sodium crackers. Vegetables Fresh or frozen vegetables (raw, steamed, roasted, or grilled). Low-sodium or reduced-sodium tomato and vegetable juices. Low-sodium or reduced-sodium tomato sauce and paste. Low-sodium or reduced-sodium canned vegetables.  Fruits All fresh, canned (in natural juice), or frozen fruits. Meat and Other Protein Products Ground beef (85% or leaner), grass-fed beef, or beef trimmed of fat. Skinless chicken or [place]. Ground chicken or [place]. Pork trimmed of fat. All fish and seafood. Eggs. Dried beans, peas, or lentils. Unsalted nuts and seeds. Unsalted canned beans. Dairy Low-fat dairy products, such as skim or 1% milk, 2% or reduced-fat cheeses, low-fat ricotta or cottage cheese, or plain low-fat yogurt. Low-sodium or reduced-sodium cheeses. Fats and Oils Tub margarines without trans fats. Light or reduced-fat mayonnaise and salad dressings (reduced sodium). Avocado. Safflower, olive, or canola oils. Natural peanut or almond butter. Other Unsalted popcorn and pretzels. The items listed above may not be a complete list of recommended foods or beverages. Contact your dietitian for more options. WHAT FOODS ARE NOT RECOMMENDED? Grains White bread. White pasta. White rice. Refined cornbread. Bagels and croissants. Crackers that contain trans fat. Vegetables Creamed or fried vegetables. Vegetables in a cheese sauce. Regular canned vegetables. Regular canned tomato sauce and paste.  Regular tomato and vegetable juices. Fruits Dried fruits. Canned fruit in light or heavy syrup. Fruit juice. Meat and Other Protein Products Fatty cuts of meat. Ribs, chicken  wings, bacon, sausage, bologna, salami, chitterlings, fatback, hot dogs, bratwurst, and packaged luncheon meats. Salted nuts and seeds. Canned beans with salt. Dairy Whole or 2% milk, cream, half-and-half, and cream cheese. Whole-fat or sweetened yogurt. Full-fat cheeses or blue cheese. Nondairy creamers and whipped toppings. Processed cheese, cheese spreads, or cheese curds. Condiments Onion and garlic salt, seasoned salt, table salt, and sea salt. Canned and packaged gravies. Worcestershire sauce. Tartar sauce. Barbecue sauce. Teriyaki sauce. Soy sauce, including reduced sodium. Steak sauce. Fish sauce. Oyster sauce. Cocktail sauce. Horseradish. Ketchup and mustard. Meat flavorings and tenderizers. Bouillon cubes. Hot sauce. Tabasco sauce. Marinades. Taco seasonings. Relishes. Fats and Oils Butter, stick margarine, lard, shortening, ghee, and bacon fat. Coconut, palm kernel, or palm oils. Regular salad dressings. Other Pickles and olives. Salted popcorn and pretzels. The items listed above may not be a complete list of foods and beverages to avoid. Contact your dietitian for more information. WHERE CAN I FIND MORE INFORMATION? National Heart, Lung, and Blood Institute: [employer] Document Released: 05/05/2011 Document Revised: 09/30/2013 Document Reviewed: 03/20/2013 [facility] Patient Information 2015 [place], [place]. This information is not intended to replace advice given to you by your health care provider. Make sure you discuss any questions you have with your health care provider.  Alcohol Use Disorder Alcohol use disorder is a mental disorder. It is not a one-time incident of heavy drinking. Alcohol use disorder is the excessive and uncontrollable use of alcohol over time that leads to problems with functioning in one or more areas of daily living. People with this disorder risk harming themselves and others when they drink to excess. Alcohol use  disorder also can cause other mental disorders, such as mood and anxiety disorders, and serious physical problems. People with alcohol use disorder often misuse other drugs.  Alcohol use disorder is common and widespread. Some people with this disorder drink alcohol to cope with or escape from negative life events. Others drink to relieve chronic pain or symptoms of mental illness. People with a family history of alcohol use disorder are at higher risk of losing control and using alcohol to excess.  SYMPTOMS  Signs and symptoms of alcohol use disorder may include the following:   Consumption ofalcohol inlarger amounts or over a longer period of time than intended.  Multiple unsuccessful attempts to cutdown or control alcohol use.   A great deal of time spent obtaining alcohol, using alcohol, or recovering from the effects of alcohol (hangover).  A strong desire or urge to use alcohol (cravings).   Continued use of alcohol despite problems at work, school, or home because of alcohol use.   Continued use of alcohol despite problems in relationships because of alcohol use.  Continued use of alcohol in situations when it is physically hazardous, such as driving a car.  Continued use of alcohol despite awareness of a physical or psychological problem that is likely related to alcohol use. Physical problems related to alcohol use can involve the brain, heart, liver, stomach, and intestines. Psychological problems related to alcohol use include intoxication, depression, anxiety, psychosis, delirium, and dementia.   The need for increased amounts of alcohol to achieve the same desired effect, or a decreased effect from the consumption of the same amount of alcohol (tolerance).  Withdrawal symptoms upon reducing or stopping alcohol use, or alcohol use to reduce  or avoid withdrawal symptoms. Withdrawal symptoms include:  Racing heart.  Hand tremor.  Difficulty  sleeping.  Nausea.  Vomiting.  Hallucinations.  Restlessness.  Seizures. DIAGNOSIS Alcohol use disorder is diagnosed through an assessment by your health care provider. Your health care provider may start by asking three or four questions to screen for excessive or problematic alcohol use. To confirm a diagnosis of alcohol use disorder, at least two symptoms must be present within a 48-month period. The severity of alcohol use disorder depends on the number of symptoms:  Mild--two or three.  Moderate--four or five.  Severe--six or more. Your health care provider may perform a physical exam or use results from lab tests to see if you have physical problems resulting from alcohol use. Your health care provider may refer you to a mental health professional for evaluation. TREATMENT  Some people with alcohol use disorder are able to reduce their alcohol use to low-risk levels. Some people with alcohol use disorder need to quit drinking alcohol. When necessary, mental health professionals with specialized training in substance use treatment can help. Your health care provider can help you decide how severe your alcohol use disorder is and what type of treatment you need. The following forms of treatment are available:   Detoxification. Detoxification involves the use of prescription medicines to prevent alcohol withdrawal symptoms in the first week after quitting. This is important for people with a history of symptoms of withdrawal and for heavy drinkers who are likely to have withdrawal symptoms. Alcohol withdrawal can be dangerous and, in severe cases, cause death. Detoxification is usually provided in a hospital or in-patient substance use treatment facility.  Counseling or talk therapy. Talk therapy is provided by substance use treatment counselors. It addresses the reasons people use alcohol and ways to keep them from drinking again. The goals of talk therapy are to help people with alcohol  use disorder find healthy activities and ways to cope with life stress, to identify and avoid triggers for alcohol use, and to handle cravings, which can cause relapse.  Medicines.Different medicines can help treat alcohol use disorder through the following actions:  Decrease alcohol cravings.  Decrease the positive reward response felt from alcohol use.  Produce an uncomfortable physical reaction when alcohol is used (aversion therapy).  Support groups. Support groups are run by people who have quit drinking. They provide emotional support, advice, and guidance. These forms of treatment are often combined. Some people with alcohol use disorder benefit from intensive combination treatment provided by specialized substance use treatment centers. Both inpatient and outpatient treatment programs are available. Document Released: 06/23/2004 Document Revised: 09/30/2013 Document Reviewed: 08/23/2012 [facility] Patient Information 2015 [place], [place]. This information is not intended to replace advice given to you by your health care provider. Make sure you discuss any questions you have with your health care provider.

## 2015-02-18 LAB — BASIC METABOLIC PANEL WITH GFR
BUN: 15 mg/dL (ref 7–25)
CO2: 23 mmol/L (ref 20–31)
Calcium: 10.1 mg/dL (ref 8.6–10.4)
Chloride: 97 mmol/L — ABNORMAL LOW (ref 98–110)
Creat: 0.7 mg/dL (ref 0.50–1.05)
GFR, Est African American: >89 mL/min (ref >=60)
Glucose, Bld: 131 mg/dL — ABNORMAL HIGH (ref 65–99)
POTASSIUM: 4.5 mmol/L (ref 3.5–5.3)
SODIUM: 134 mmol/L — AB (ref 135–146)

## 2015-02-18 LAB — HEPATIC FUNCTION PANEL
ALT: 56 U/L — AB (ref 6–29)
AST: 57 U/L — AB (ref 10–35)
Albumin: 4.6 g/dL (ref 3.6–5.1)
Alkaline Phosphatase: 125 U/L (ref 33–130)
BILIRUBIN INDIRECT: 1.2 mg/dL (ref 0.2–1.2)
BILIRUBIN TOTAL: 1.6 mg/dL — AB (ref 0.2–1.2)
Bilirubin, Direct: 0.4 mg/dL — ABNORMAL HIGH (ref <=0.2)
Total Protein: 7.7 g/dL (ref 6.1–8.1)

## 2015-02-18 LAB — MAGNESIUM: Magnesium: 1.8 mg/dL (ref 1.5–2.5)

## 2015-02-21 LAB — VITAMIN B1: VITAMIN B1 (THIAMINE): 38 nmol/L — AB (ref 8–30)

## 2015-03-18 ENCOUNTER — Ambulatory Visit (INDEPENDENT_AMBULATORY_CARE_PROVIDER_SITE_OTHER): Payer: Managed Care, Other (non HMO) | Admitting: Physician Assistant

## 2015-03-18 ENCOUNTER — Encounter: Payer: Self-pay | Admitting: Physician Assistant

## 2015-03-18 VITALS — BP 130/90 | HR 98 | Temp 97.3°F | Resp 14 | Ht 66.0 in | Wt 187.0 lb

## 2015-03-18 DIAGNOSIS — K7400 Hepatic fibrosis, unspecified: Secondary | ICD-10-CM

## 2015-03-18 DIAGNOSIS — I10 Essential (primary) hypertension: Secondary | ICD-10-CM | POA: Diagnosis not present

## 2015-03-18 DIAGNOSIS — E785 Hyperlipidemia, unspecified: Secondary | ICD-10-CM

## 2015-03-18 DIAGNOSIS — Z79899 Other long term (current) drug therapy: Secondary | ICD-10-CM

## 2015-03-18 DIAGNOSIS — E559 Vitamin D deficiency, unspecified: Secondary | ICD-10-CM | POA: Diagnosis not present

## 2015-03-18 LAB — CBC WITH DIFFERENTIAL/PLATELET
BASOS ABS: 0.1 10*3/uL (ref 0.0–0.1)
Basophils Relative: 1 % (ref 0–1)
Eosinophils Absolute: 0.1 10*3/uL (ref 0.0–0.7)
Eosinophils Relative: 2 % (ref 0–5)
HEMATOCRIT: 36.1 % (ref 36.0–46.0)
HEMOGLOBIN: 12.2 g/dL (ref 12.0–15.0)
LYMPHS PCT: 24 % (ref 12–46)
Lymphs Abs: 1.5 10*3/uL (ref 0.7–4.0)
MCH: 33 pg (ref 26.0–34.0)
MCHC: 33.8 g/dL (ref 30.0–36.0)
MCV: 97.6 fL (ref 78.0–100.0)
MPV: 10.5 fL (ref 8.6–12.4)
Monocytes Absolute: 1 10*3/uL (ref 0.1–1.0)
Monocytes Relative: 16 % — ABNORMAL HIGH (ref 3–12)
NEUTROS ABS: 3.6 10*3/uL (ref 1.7–7.7)
NEUTROS PCT: 57 % (ref 43–77)
Platelets: 271 10*3/uL (ref 150–400)
RBC: 3.7 MIL/uL — ABNORMAL LOW (ref 3.87–5.11)
RDW: 15.3 % (ref 11.5–15.5)
WBC: 6.4 10*3/uL (ref 4.0–10.5)

## 2015-03-18 LAB — HEPATIC FUNCTION PANEL
ALT: 26 U/L (ref 6–29)
AST: 32 U/L (ref 10–35)
Albumin: 4.3 g/dL (ref 3.6–5.1)
Alkaline Phosphatase: 94 U/L (ref 33–130)
BILIRUBIN DIRECT: 0.2 mg/dL (ref <=0.2)
BILIRUBIN INDIRECT: 0.4 mg/dL (ref 0.2–1.2)
Total Bilirubin: 0.6 mg/dL (ref 0.2–1.2)
Total Protein: 7.1 g/dL (ref 6.1–8.1)

## 2015-03-18 LAB — BASIC METABOLIC PANEL WITH GFR
BUN: 12 mg/dL (ref 7–25)
CO2: 22 mmol/L (ref 20–31)
Calcium: 9.5 mg/dL (ref 8.6–10.4)
Chloride: 101 mmol/L (ref 98–110)
Creat: 0.78 mg/dL (ref 0.50–1.05)
GFR, EST NON AFRICAN AMERICAN: 88 mL/min (ref >=60)
GLUCOSE: 104 mg/dL — AB (ref 65–99)
POTASSIUM: 4.3 mmol/L (ref 3.5–5.3)
Sodium: 135 mmol/L (ref 135–146)

## 2015-03-18 LAB — LIPID PANEL
CHOL/HDL RATIO: 2.5 ratio (ref <=5.0)
Cholesterol: 189 mg/dL (ref 125–200)
HDL: 75 mg/dL (ref >=46)
LDL Cholesterol: 95 mg/dL (ref <130)
Triglycerides: 93 mg/dL (ref <150)
VLDL: 19 mg/dL (ref <30)

## 2015-03-18 LAB — TSH: TSH: 0.548 u[IU]/mL (ref 0.350–4.500)

## 2015-03-18 LAB — MAGNESIUM: MAGNESIUM: 1.9 mg/dL (ref 1.5–2.5)

## 2015-03-18 MED ORDER — BENZONATATE 100 MG PO CAPS: 200.0000 mg | ORAL_CAPSULE | Freq: Three times a day (TID) | ORAL | Status: DC | PRN

## 2015-03-18 MED ORDER — AZITHROMYCIN 250 MG PO TABS: ORAL_TABLET | ORAL | Status: AC

## 2015-03-18 NOTE — Patient Instructions (Signed)
Benefiber is good for constipation/diarrhea/irritable bowel syndrome, it helps with weight loss and can help lower your bad cholesterol. Please do 1-2 TBSP in the morning in water, coffee, or tea. It can take up to a month before you can see a difference with your bowel movements. It is cheapest from [employer]co, sam's, [employer].   Cholesterol Cholesterol is a white, waxy, fat-like substance needed by your body in small amounts. The liver makes all the cholesterol you need. Cholesterol is carried from the liver by the blood through the blood vessels. Deposits of cholesterol (plaque) may build up on blood vessel walls. These make the arteries narrower and stiffer. Cholesterol plaques increase the risk for heart attack and stroke.  You cannot feel your cholesterol level even if it is very high. The only way to know it is high is with a blood test. Once you know your cholesterol levels, you should keep a record of the test results. Work with your health care provider to keep your levels in the desired range.  WHAT DO THE RESULTS MEAN?  Total cholesterol is a rough measure of all the cholesterol in your blood.   LDL is the so-called bad cholesterol. This is the type that deposits cholesterol in the walls of the arteries. You want this level to be low.   HDL is the good cholesterol because it cleans the arteries and carries the LDL away. You want this level to be high.  Triglycerides are fat that the body can either burn for energy or store. High levels are closely linked to heart disease.  WHAT ARE THE DESIRED LEVELS OF CHOLESTEROL?  Total cholesterol below 200.   LDL below 100 for people at risk, below 70 for those at very high risk.   HDL above 50 is good, above 60 is best.   Triglycerides below 150.  HOW CAN I LOWER MY CHOLESTEROL?  Diet. Follow your diet programs as directed by your health care provider.   Choose fish or white meat chicken and [place], roasted or baked. Limit fatty cuts  of red meat, fried foods, and processed meats, such as sausage and lunch meats.   Eat lots of fresh fruits and vegetables.  Choose whole grains, beans, pasta, potatoes, and cereals.   Use only small amounts of olive, corn, or canola oils.   Avoid butter, mayonnaise, shortening, or palm kernel oils.  Avoid foods with trans fats.   Drink skim or nonfat milk and eat low-fat or nonfat yogurt and cheeses. Avoid whole milk, cream, ice cream, egg yolks, and full-fat cheeses.   Healthy desserts include angel food cake, ginger snaps, animal crackers, hard candy, popsicles, and low-fat or nonfat frozen yogurt. Avoid pastries, cakes, pies, and cookies.   Exercise. Follow your exercise programs as directed by your health care provider.   A regular program helps decrease LDL and raise HDL.   A regular program helps with weight control.   Do things that increase your activity level like gardening, walking, or taking the stairs. Ask your health care provider about how you can be more active in your daily life.   Medicine. Take medicine only as directed by your health care provider.   Medicine may be prescribed by your health care provider to help lower cholesterol and decrease the risk for heart disease.   If you have several risk factors, you may need medicine even if your levels are normal.   This information is not intended to replace advice given to you by your  health care provider. Make sure you discuss any questions you have with your health care provider.   Document Released: 02/08/2001 Document Revised: 06/06/2014 Document Reviewed: 02/27/2013 Elsevier Interactive Patient Education 2016 [employer].  Sinusitis can be uncomfortable. People with sinusitis have congestion with yellow/green/gray discharge, sinus pain/pressure, pain around the eyes. Sinus infections almost ALWAYS stem from a viral infection and antibiotics don't work against a virus. Even when bacteria is  responsible, the infections usually clear up on their own in a week or so.   PLEASE TRY TO DO OVER THE COUNTER TREATMENT AND PREDNISONE FOR 5-7 DAYS AND IF YOU ARE NOT GETTING BETTER OR GETTING WORSE THEN YOU CAN START ON AN ANTIBIOTIC GIVEN.  Can take the prednisone AT NIGHT WITH DINNER, it take 8-12 hours to start working so it will NOT affect your sleeping if you take it at night with your food!! Take two pills the first night and 1 or two pill the second night and then 1 pill the other nights.   Risk of antibiotic use: About 1 in 4 people who take antibiotics have side effects including stomach problems, dizziness, or rashes. Those problems clear up soon after stopping the drugs, but in rare cases antibiotics can cause severe allergic reaction. Over use of antibiotics also encourages the growth of bacteria that can't be controlled easily with drugs. That makes you more vunerable to antibiotic-resistant infections and undermines the benefits of antibiotics for others.   Waste of Money: Antibiotics often aren't very expensive, but any money spent on unnecessary drugs is money down the drain.   When are antibiotics needed? Only when symptoms last longer than a week.  Start to improve but then worsen again  -It can take up to 2 weeks to feel better.   -If you do not get better in 7-10 days (Have fever, facial pain, dental pain and swelling), then please call the office and it is now appropriate to start an antibiotic.   -Please take Tylenol or Ibuprofen for pain. -Acetaminiphen 325mg  orally every 4-6 hours for pain.  Max: 10 per day -Ibuprofen 200mg  orally every 6-8 hours for pain.  Take with food to avoid ulcers.   Max 10 per day  Please pick one of the over the counter allergy medications below and take it once daily for allergies.  Claritin or loratadine cheapest but likely the weakest  Zyrtec or certizine at night because it can make you sleepy The strongest is allegra or fexafinadine    Cheapest at walmart, sam's, [employer]co  -While drinking fluids, pinch and hold nose close and swallow.  This will help open up your eustachian tubes to drain the fluid behind your ear drums. -Try steam showers to open your nasal passages.   Drink lots of water to stay hydrated and to thin mucous.  Flonase/Nasonex is to help the inflammation.  Take 2 sprays in each nostril at bedtime.  Make sure you spray towards the outside of each nostril towards the outer corner of your eye, hold nose close and tilt head back.  This will help the medication get into your sinuses.  If you do not like this medication, then use saline nasal sprays same directions as above for Flonase. Stop the medication right away if you get blurring of your vision or nose bleeds.  Sinusitis Sinusitis is redness, soreness, and inflammation of the paranasal sinuses. Paranasal sinuses are air pockets within the bones of your face (beneath the eyes, the middle of the forehead, or  above the eyes). In healthy paranasal sinuses, mucus is able to drain out, and air is able to circulate through them by way of your nose. However, when your paranasal sinuses are inflamed, mucus and air can become trapped. This can allow bacteria and other germs to grow and cause infection. Sinusitis can develop quickly and last only a short time (acute) or continue over a long period (chronic). Sinusitis that lasts for more than 12 weeks is considered chronic.  CAUSES  Causes of sinusitis include: Allergies. Structural abnormalities, such as displacement of the cartilage that separates your nostrils (deviated septum), which can decrease the air flow through your nose and sinuses and affect sinus drainage. Functional abnormalities, such as when the small hairs (cilia) that line your sinuses and help remove mucus do not work properly or are not present. SIGNS AND SYMPTOMS  Symptoms of acute and chronic sinusitis are the same. The primary symptoms are pain and  pressure around the affected sinuses. Other symptoms include: Upper toothache. Earache. Headache. Bad breath. Decreased sense of smell and taste. A cough, which worsens when you are lying flat. Fatigue. Fever. Thick drainage from your nose, which often is green and may contain pus (purulent). Swelling and warmth over the affected sinuses. DIAGNOSIS  Your health care provider will perform a physical exam. During the exam, your health care provider may: Look in your nose for signs of abnormal growths in your nostrils (nasal polyps).  Tap over the affected sinus to check for signs of infection. View the inside of your sinuses (endoscopy) using an imaging device that has a light attached (endoscope). If your health care provider suspects that you have chronic sinusitis, one or more of the following tests may be recommended: Allergy tests. Nasal culture. A sample of mucus is taken from your nose, sent to a lab, and screened for bacteria. Nasal cytology. A sample of mucus is taken from your nose and examined by your health care provider to determine if your sinusitis is related to an allergy. TREATMENT  Most cases of acute sinusitis are related to a viral infection and will resolve on their own within 10 days. Sometimes medicines are prescribed to help relieve symptoms (pain medicine, decongestants, nasal steroid sprays, or saline sprays).  However, for sinusitis related to a bacterial infection, your health care provider will prescribe antibiotic medicines. These are medicines that will help kill the bacteria causing the infection.  Rarely, sinusitis is caused by a fungal infection. In theses cases, your health care provider will prescribe antifungal medicine. For some cases of chronic sinusitis, surgery is needed. Generally, these are cases in which sinusitis recurs more than 3 times per year, despite other treatments. HOME CARE INSTRUCTIONS  Drink plenty of water. Water helps thin the mucus so  your sinuses can drain more easily. Use a humidifier. Inhale steam 3 to 4 times a day (for example, sit in the bathroom with the shower running). Apply a warm, moist washcloth to your face 3 to 4 times a day, or as directed by your health care provider. Use saline nasal sprays to help moisten and clean your sinuses. Take medicines only as directed by your health care provider. If you were prescribed either an antibiotic or antifungal medicine, finish it all even if you start to feel better. SEEK IMMEDIATE MEDICAL CARE IF: You have increasing pain or severe headaches. You have nausea, vomiting, or drowsiness. You have swelling around your face. You have vision problems. You have a stiff neck. You have  difficulty breathing. MAKE SURE YOU:  Understand these instructions. Will watch your condition. Will get help right away if you are not doing well or get worse. Document Released: 05/16/2005 Document Revised: 09/30/2013 Document Reviewed: 05/31/2011 [facility] Patient Information 2015 [place], [place]. This information is not intended to replace advice given to you by your health care provider. Make sure you discuss any questions you have with your health care provider.  We want weight loss that will last so you should lose 1-2 pounds a week.  THAT IS IT! Please pick THREE things a month to change. Once it is a habit check off the item. Then pick another three items off the list to become habits.  If you are already doing a habit on the list GREAT!  Cross that item off! o Don't drink your calories. Ie, alcohol, soda, fruit juice, and sweet tea.  o Drink more water. Drink a glass when you feel hungry or before each meal.  o Eat breakfast - Complex carb and protein (likeDannon light and fit yogurt, oatmeal, fruit, eggs, [place] bacon). o Measure your cereal.  Eat no more than one cup a day. (ie [place]) o Eat an apple a day. o Add a vegetable a day. o Try a new vegetable a month. o Use Pam! Stop  using oil or butter to cook. o Don't finish your plate or use smaller plates. o Share your dessert. o Eat sugar free Jello for dessert or frozen grapes. o Don't eat 2-3 hours before bed. o Switch to whole wheat bread, pasta, and brown rice. o Make healthier choices when you eat out. No fries! o Pick baked chicken, NOT fried. o Don't forget to SLOW DOWN when you eat. It is not going anywhere.  o Take the stairs. o Park far away in the parking lot o [employer] (or weights) for 10 minutes while watching TV. o Walk at work for 10 minutes during break. o Walk outside 1 time a week with your friend, kids, dog, or significant other. o Start a walking group at church. o Walk the mall as much as you can tolerate.  o Keep a food diary. o Weigh yourself daily. o Walk for 15 minutes 3 days per week. o Cook at home more often and eat out less.  If life happens and you go back to old habits, it is okay.  Just start over. You can do it!   If you experience chest pain, get short of breath, or tired during the exercise, please stop immediately and inform your doctor.

## 2015-03-18 NOTE — Progress Notes (Signed)
Assessment and Plan: 1. Essential hypertension Continue medications for now, may need to increase atenolol.  - CBC with Differential/Platelet - BASIC METABOLIC PANEL WITH GFR - TSH  2. Fibrosis of liver (HCC) States she is not drinking, advised weight loss - Hepatic function panel  3. Vitamin D deficiency Continue supplement  4. Medication management - Magnesium  5. Hyperlipidemia Check labs - Lipid panel  6. Cough, likely sinus/allergy related, lungs CTAB Will hold the zpak and take if she is not getting better, increase fluids, rest, cont allergy pill   HPI 49 y.o.female presents for follow up for BP and cholesterol. Patient has alcoholic hepatitis and has hyperlipidemia, last visit her LFTs have come down considerablely so she was started on 10mg  lipitor daily but states she did not start it, is on fish oil, RYRE and eating better would like to try that first before getting on meds. Her blood pressure has also been difficult to control, she is on norvasc 10mg , cozaar 100mg , atenolol 100mg , and has been good at home running 120-130's.  BP: 130/90 mmHg   Lab Results  Component Value Date   CHOL 227* 12/26/2014   HDL 80 12/26/2014   LDLCALC 120 12/26/2014   TRIG 135 12/26/2014   CHOLHDL 2.8 12/26/2014   Lab Results  Component Value Date   ALT 56* 02/17/2015   AST 57* 02/17/2015   ALKPHOS 125 02/17/2015   BILITOT 1.6* 02/17/2015   She has started to go to the [employer] to work out and has been hanging out with friends.   She has had a nonproductive cough x 1 week, worse at night, has sinus congestion/pressure, has been on allegra and mucinex.    Past Medical History  Diagnosis Date  . Neuromuscular disorder (HCC)   . Leg pain, right   . Anxiety   . Rheumatoid arthritis involving multiple joints (HCC)     rheumatoid arthritis- hands, degeneration of lumbar  spine   . GERD (gastroesophageal reflux disease)   . Mental disorder   . Eye irritation     R eye- redness-  from removing contact lense  . Hypertension   . Hyperlipidemia   . Allergy   . Herpes simplex type II infection   . Obesity   . Depression   . Alcoholism (HCC)      Allergies  Allergen Reactions  . Codeine Itching      Current Outpatient Prescriptions on File Prior to Visit  Medication Sig Dispense Refill  . ALPRAZolam (XANAX) 0.5 MG tablet TAKE 1 TABLET BY MOUTH 3 TIMES A DAY AS NEEDED 90 tablet 5  . amLODipine (NORVASC) 10 MG tablet Take 1 tablet (10 mg total) by mouth daily. 30 tablet 2  . atenolol (TENORMIN) 100 MG tablet Take 1 tablet (100 mg total) by mouth daily. 90 tablet 0  . atorvastatin (LIPITOR) 10 MG tablet Take 1 tablet (10 mg total) by mouth daily. 30 tablet 11  . citalopram (CELEXA) 40 MG tablet Take 40 mg by mouth daily.    . folic acid (FOLVITE) 1 MG tablet Take 1 mg by mouth daily.    . hydroxychloroquine (PLAQUENIL) 200 MG tablet Take 200 mg by mouth 2 (two) times daily.    . hydroxypropyl methylcellulose / hypromellose (ISOPTO TEARS / GONIOVISC) 2.5 % ophthalmic solution Place 1 drop into both eyes 3 (three) times daily as needed for dry eyes.    02/19/2015 losartan (COZAAR) 100 MG tablet TAKE 1 TABLET (100 MG TOTAL) BY MOUTH DAILY. 30  tablet 3  . ondansetron (ZOFRAN) 4 MG tablet Take 1 tablet (4 mg total) by mouth daily as needed for nausea or vomiting. 30 tablet 1  . promethazine (PHENERGAN) 25 MG tablet Take 25 mg by mouth every 6 (six) hours as needed.    . sucralfate (CARAFATE) 1 G tablet Take 1 g by mouth 4 (four) times daily -  with meals and at bedtime.     No current facility-administered medications on file prior to visit.    ROS: all negative except above.   Physical Exam: Filed Weights   03/18/15 1436  Weight: 187 lb (84.823 kg)   BP 130/90 mmHg  Pulse 98  Temp(Src) 97.3 F (36.3 C) (Temporal)  Resp 14  Ht 5\' 6"  (1.676 m)  Wt 187 lb (84.823 kg)  BMI 30.20 kg/m2  SpO2 91%  LMP 06/14/2012  Wt Readings from Last 3 Encounters:  03/18/15 187 lb  (84.823 kg)  02/17/15 187 lb (84.823 kg)  12/26/14 186 lb (84.369 kg)   General Appearance: Well nourished, in no apparent distress. Eyes: PERRLA, EOMs, conjunctiva no swelling or erythema Sinuses: + Frontal/maxillary tenderness ENT/Mouth: Ext aud canals clear, TMs without erythema, bulging. No erythema, swelling, or exudate on post pharynx.  Tonsils not swollen or erythematous. Hearing normal.  Neck: Supple, thyroid normal.  Respiratory: Respiratory effort normal, BS equal bilaterally without rales, rhonchi, wheezing or stridor.  Cardio: RRR with no MRGs. Brisk peripheral pulses without edema.  Abdomen: Soft, obese, + BS.  Non tender, no guarding, rebound, hernias, masses. Lymphatics: Non tender without lymphadenopathy.  Musculoskeletal: Full ROM, 5/5 strength, normal gait.  Skin: Warm, dry without rashes, lesions, ecchymosis.  Neuro: Cranial nerves intact. Normal muscle tone, no cerebellar symptoms. Sensation intact.  Psych: Awake and oriented X 3, normal affect, Insight and Judgment appropriate.     12/28/14, PA-C 2:46 PM [facility]

## 2015-03-19 ENCOUNTER — Ambulatory Visit: Payer: Self-pay | Admitting: Physician Assistant

## 2015-04-12 ENCOUNTER — Other Ambulatory Visit: Payer: Self-pay | Admitting: Physician Assistant

## 2015-04-27 ENCOUNTER — Other Ambulatory Visit: Payer: Self-pay | Admitting: Internal Medicine

## 2015-04-27 NOTE — Telephone Encounter (Signed)
[facility]

## 2015-05-07 ENCOUNTER — Ambulatory Visit: Payer: Self-pay | Admitting: Physician Assistant

## 2015-05-16 ENCOUNTER — Other Ambulatory Visit: Payer: Self-pay | Admitting: Internal Medicine

## 2015-05-21 ENCOUNTER — Other Ambulatory Visit: Payer: Self-pay

## 2015-05-21 MED ORDER — HYDROXYCHLOROQUINE SULFATE 200 MG PO TABS: 200.0000 mg | ORAL_TABLET | Freq: Two times a day (BID) | ORAL | Status: DC

## 2015-06-08 ENCOUNTER — Other Ambulatory Visit: Payer: PRIVATE HEALTH INSURANCE

## 2015-06-08 DIAGNOSIS — Z0283 Encounter for blood-alcohol and blood-drug test: Secondary | ICD-10-CM

## 2015-06-08 DIAGNOSIS — F1011 Alcohol abuse, in remission: Secondary | ICD-10-CM

## 2015-06-09 LAB — DRUG SCREEN URINE W/ALC, PAIN MGMT, REFLEX
Amphetamine Screen, Ur: NEGATIVE
BARBITURATE QUANT UR: NEGATIVE
BENZODIAZEPINES.: NEGATIVE
Cocaine Metabolites: NEGATIVE
Creatinine,U: 105.35 mg/dL
Ethyl Alcohol: 251 mg/dL — ABNORMAL HIGH (ref <10)
MARIJUANA METABOLITE: NEGATIVE
METHADONE: NEGATIVE
OPIATES: NEGATIVE
PHENCYCLIDINE (PCP): NEGATIVE
PROPOXYPHENE: NEGATIVE

## 2015-06-09 LAB — ETHANOL: ALCOHOL ETHYL (B): 251 mg/dL — AB (ref 0–10)

## 2015-06-17 ENCOUNTER — Other Ambulatory Visit: Payer: Self-pay | Admitting: Internal Medicine

## 2015-06-17 ENCOUNTER — Other Ambulatory Visit: Payer: Self-pay | Admitting: Physician Assistant

## 2015-06-17 ENCOUNTER — Encounter: Payer: Self-pay | Admitting: Physician Assistant

## 2015-06-17 ENCOUNTER — Encounter: Payer: Self-pay | Admitting: Internal Medicine

## 2015-06-17 DIAGNOSIS — F1020 Alcohol dependence, uncomplicated: Secondary | ICD-10-CM | POA: Insufficient documentation

## 2015-06-19 ENCOUNTER — Other Ambulatory Visit: Payer: Self-pay | Admitting: Internal Medicine

## 2015-06-26 ENCOUNTER — Other Ambulatory Visit: Payer: Self-pay | Admitting: Physician Assistant

## 2015-06-29 ENCOUNTER — Encounter: Payer: Self-pay | Admitting: Physician Assistant

## 2015-07-07 ENCOUNTER — Ambulatory Visit (INDEPENDENT_AMBULATORY_CARE_PROVIDER_SITE_OTHER): Payer: Managed Care, Other (non HMO) | Admitting: Internal Medicine

## 2015-07-07 VITALS — BP 104/70 | HR 88 | Temp 98.1°F | Wt 186.8 lb

## 2015-07-07 DIAGNOSIS — F1010 Alcohol abuse, uncomplicated: Secondary | ICD-10-CM

## 2015-07-07 DIAGNOSIS — F329 Major depressive disorder, single episode, unspecified: Secondary | ICD-10-CM

## 2015-07-07 DIAGNOSIS — F32A Depression, unspecified: Secondary | ICD-10-CM

## 2015-07-07 DIAGNOSIS — Z79899 Other long term (current) drug therapy: Secondary | ICD-10-CM | POA: Diagnosis not present

## 2015-07-07 DIAGNOSIS — F1011 Alcohol abuse, in remission: Secondary | ICD-10-CM

## 2015-07-07 MED ORDER — RIFAXIMIN 550 MG PO TABS: 550.0000 mg | ORAL_TABLET | Freq: Two times a day (BID) | ORAL | Status: DC

## 2015-07-07 MED ORDER — BUPROPION HCL ER (XL) 150 MG PO TB24: 150.0000 mg | ORAL_TABLET | ORAL | Status: DC

## 2015-07-07 NOTE — Progress Notes (Signed)
   Subjective:    Patient ID: Michele Cooley, female    DOB: 1965/11/24, 50 y.o.   MRN: [id number]  HPI  Patient presents to the office for evaluation of depression.  She reports that having alcohol in her blood was a wake up call and she reports that she has started seeing a therapist to try to work through her problems.  She reports that in the past she was on wellbutrin she felt like that helped make her a lot happier.  She reports that she is currently seeing Hampton Abbot [phone]  She is seeing her every other week.  She is journaling.   She wants her to go to a much more structured routine.  She is due back to see her next Tuesday.  She reports that her husband has noticed that there has been a change.  She hasn't taken Wellbutrin in several years back to 2007.      Review of Systems  Constitutional: Negative for fever, chills and fatigue.  Respiratory: Negative for chest tightness and shortness of breath.        Objective:   Physical Exam  Constitutional: She is oriented to person, place, and time. She appears well-developed and well-nourished. No distress.  HENT:  Head: Normocephalic.  Mouth/Throat: Oropharynx is clear and moist. No oropharyngeal exudate.  Eyes: Conjunctivae are normal. No scleral icterus.  Neck: Normal range of motion. Neck supple. No JVD present. No thyromegaly present.  Cardiovascular: Normal rate, regular rhythm, normal heart sounds and intact distal pulses.  Exam reveals no gallop and no friction rub.   No murmur heard. Pulmonary/Chest: Effort normal and breath sounds normal. No respiratory distress. She has no wheezes. She has no rales. She exhibits no tenderness.  Musculoskeletal: Normal range of motion.  Lymphadenopathy:    She has no cervical adenopathy.  Neurological: She is alert and oriented to person, place, and time.  Skin: Skin is warm and dry. She is not diaphoretic.  Psychiatric: She has a normal mood and affect. Her behavior is normal.  Judgment and thought content normal.  Nursing note and vitals reviewed.   Filed Vitals:   07/07/15 1628  BP: 104/70  Pulse: 88  Temp: 98.1 F (36.7 C)          Assessment & Plan:    1. Depression -wellbutrin -stay at 20 mg of celexa secondary to liver damage -cont therapy -appears to be doing well  2. Medication management  - Magnesium - BASIC METABOLIC PANEL WITH GFR - Hepatic function panel  3. History of alcohol abuse  - Alcohol

## 2015-07-08 ENCOUNTER — Other Ambulatory Visit: Payer: Self-pay | Admitting: *Deleted

## 2015-07-08 LAB — BASIC METABOLIC PANEL WITH GFR
BUN: 23 mg/dL (ref 7–25)
CALCIUM: 9.9 mg/dL (ref 8.6–10.4)
CO2: 27 mmol/L (ref 20–31)
CREATININE: 0.64 mg/dL (ref 0.50–1.05)
Chloride: 101 mmol/L (ref 98–110)
GLUCOSE: 95 mg/dL (ref 65–99)
POTASSIUM: 4.7 mmol/L (ref 3.5–5.3)
Sodium: 136 mmol/L (ref 135–146)

## 2015-07-08 LAB — HEPATIC FUNCTION PANEL
ALBUMIN: 4.3 g/dL (ref 3.6–5.1)
ALT: 39 U/L — ABNORMAL HIGH (ref 6–29)
AST: 36 U/L — ABNORMAL HIGH (ref 10–35)
Alkaline Phosphatase: 109 U/L (ref 33–130)
BILIRUBIN TOTAL: 0.7 mg/dL (ref 0.2–1.2)
Bilirubin, Direct: 0.1 mg/dL (ref <=0.2)
Indirect Bilirubin: 0.6 mg/dL (ref 0.2–1.2)
TOTAL PROTEIN: 7.6 g/dL (ref 6.1–8.1)

## 2015-07-08 LAB — MAGNESIUM: MAGNESIUM: 1.6 mg/dL (ref 1.5–2.5)

## 2015-07-09 LAB — ETHANOL

## 2015-08-11 ENCOUNTER — Ambulatory Visit (INDEPENDENT_AMBULATORY_CARE_PROVIDER_SITE_OTHER): Payer: Managed Care, Other (non HMO) | Admitting: Internal Medicine

## 2015-08-11 ENCOUNTER — Encounter: Payer: Self-pay | Admitting: Internal Medicine

## 2015-08-11 VITALS — BP 162/98 | HR 122 | Temp 98.0°F | Resp 18 | Ht 66.0 in | Wt 189.0 lb

## 2015-08-11 DIAGNOSIS — K7460 Unspecified cirrhosis of liver: Secondary | ICD-10-CM | POA: Diagnosis not present

## 2015-08-11 NOTE — Progress Notes (Signed)
   Subjective:    Patient ID: Michele Cooley, female    DOB: [birth year], 50 y.o.   MRN: [id number]  HPI  Patient presents to the office for repeat office visit for [facility] paperwork and also for reevaluation of alcoholism.  She needs to have forms filled out for her to keep her license due to alcohol abuse.  She reports that she thinks that [facility] is the one who reported her.  She reports that she has been doing well.  She has not had anything to drink since her last visit.  She reports that she has not seen her therapist two weeks ago.  She reports that she did try to check in to a rehab a month ago but was not accepted secondary to being in active detox.  She has not seen her liver doctor in a while.  She has asked them to call if they have cancellations.  She is still taking the xifaxamin.  She has stopped taking the lactulose.  She does report that she has been having low blood pressure especially in the mornings.   Review of Systems  Constitutional: Negative for fever, chills and fatigue.  HENT: Negative for congestion, ear pain, postnasal drip, sore throat and voice change.   Respiratory: Negative for chest tightness and shortness of breath.   Cardiovascular: Negative for chest pain and palpitations.  Gastrointestinal: Negative for nausea, vomiting, diarrhea, constipation, blood in stool and abdominal distention.  Psychiatric/Behavioral: Positive for decreased concentration. Negative for sleep disturbance.       Objective:   Physical Exam  Constitutional: She is oriented to person, place, and time.  Disheveled appearance, faint smell of alcohol  HENT:  Head: Normocephalic and atraumatic.  Mouth/Throat: Oropharynx is clear and moist. No oropharyngeal exudate.  Eyes: Conjunctivae are normal. No scleral icterus.  Neck: Normal range of motion. Neck supple. No JVD present. No thyromegaly present.  Cardiovascular: Regular rhythm, normal heart sounds and intact distal pulses.   Tachycardia present.  Exam reveals no gallop and no friction rub.   No murmur heard. Pulmonary/Chest: Effort normal and breath sounds normal. No respiratory distress. She has no wheezes. She has no rales. She exhibits no tenderness.  Abdominal: Soft. Bowel sounds are normal. She exhibits no distension and no mass. There is no tenderness. There is no rebound and no guarding.  Lymphadenopathy:    She has no cervical adenopathy.  Neurological: She is alert and oriented to person, place, and time.  Skin: Skin is warm and dry.  Psychiatric: She has a normal mood and affect. Her behavior is normal. Judgment and thought content normal.  Nursing note and vitals reviewed.   Filed Vitals:   08/11/15 1544  BP: 162/98  Pulse: 122  Temp: 98 F (36.7 C)  Resp: 18          Assessment & Plan:    1. Cirrhosis of liver without ascites, unspecified hepatic cirrhosis type (HCC)  - Ammonia - Hepatic function panel - Alcohol  Cut amlodipine in half.  Do feel that given appearance and smell of alcohol that alcohol testing is warranted at todays visit.

## 2015-08-11 NOTE — Patient Instructions (Addendum)
Please cut amlodipine in half.  If this is the tablet you already cut in half please stop the medication completely.    I will forward your liver enzymes and also your ammonia level over to them.  Please call them and confirm that they want you to stop with your lactulose.

## 2015-08-12 ENCOUNTER — Encounter: Payer: Self-pay | Admitting: Internal Medicine

## 2015-08-12 ENCOUNTER — Telehealth: Payer: Self-pay | Admitting: *Deleted

## 2015-08-12 LAB — ETHANOL: Alcohol, Ethyl (B): 104 mg/dL — ABNORMAL HIGH (ref 0–10)

## 2015-08-12 LAB — AMMONIA: Ammonia: 76 umol/L — ABNORMAL HIGH (ref 16–53)

## 2015-08-12 NOTE — Telephone Encounter (Signed)
Pt called & said that she couldn't read [facility] my chart message so I read her the My Chart message that [facility] sent.  Pt declined to go to er for detox pt was crying & upset asking for numbers to call for treatment said her computer is down. I did give her a few #'s to call.  I told pt she need 2 go to ER due to the severity of detoxing at home but pt seems to think she can detox at home & get help once she detoxes but I did tell pt she could not make it through detox & that she needs to be monitored. Pt was strongly advised but not sure that she will go, [place]

## 2015-08-13 ENCOUNTER — Other Ambulatory Visit: Payer: Self-pay | Admitting: Internal Medicine

## 2015-08-13 LAB — HEPATIC FUNCTION PANEL
ALK PHOS: 110 U/L (ref 33–130)
ALT: 28 U/L (ref 6–29)
AST: 31 U/L (ref 10–35)
Albumin: 4.1 g/dL (ref 3.6–5.1)
BILIRUBIN DIRECT: 0.1 mg/dL (ref <=0.2)
BILIRUBIN INDIRECT: 0.1 mg/dL — AB (ref 0.2–1.2)
BILIRUBIN TOTAL: 0.2 mg/dL (ref 0.2–1.2)
Total Protein: 7.1 g/dL (ref 6.1–8.1)

## 2015-08-13 LAB — AMMONIA

## 2015-08-19 ENCOUNTER — Telehealth: Payer: Self-pay | Admitting: Internal Medicine

## 2015-08-19 DIAGNOSIS — K7030 Alcoholic cirrhosis of liver without ascites: Secondary | ICD-10-CM

## 2015-08-19 NOTE — Telephone Encounter (Signed)
Patient called requesting new liver doctor in [place].  Will forward to Theodoro Kalata our referral coordinator.  Given her license suspension do feel that this is a reasonable request.

## 2015-08-20 ENCOUNTER — Encounter: Payer: Self-pay | Admitting: Internal Medicine

## 2015-09-19 ENCOUNTER — Other Ambulatory Visit: Payer: Self-pay | Admitting: Physician Assistant

## 2015-09-19 DIAGNOSIS — I10 Essential (primary) hypertension: Secondary | ICD-10-CM

## 2015-10-14 ENCOUNTER — Other Ambulatory Visit: Payer: Self-pay | Admitting: Internal Medicine

## 2015-10-19 ENCOUNTER — Other Ambulatory Visit: Payer: Self-pay | Admitting: Internal Medicine

## 2015-10-19 MED ORDER — ALPRAZOLAM 0.25 MG PO TABS: 0.2500 mg | ORAL_TABLET | Freq: Three times a day (TID) | ORAL | Status: DC | PRN

## 2015-11-04 ENCOUNTER — Ambulatory Visit: Payer: Self-pay | Admitting: Internal Medicine

## 2015-11-09 ENCOUNTER — Ambulatory Visit: Payer: Self-pay | Admitting: Internal Medicine

## 2015-11-12 ENCOUNTER — Other Ambulatory Visit: Payer: Self-pay | Admitting: Internal Medicine

## 2015-11-12 ENCOUNTER — Other Ambulatory Visit: Payer: Self-pay | Admitting: Physician Assistant

## 2015-11-14 ENCOUNTER — Other Ambulatory Visit: Payer: Self-pay | Admitting: Internal Medicine

## 2015-11-16 ENCOUNTER — Encounter (INDEPENDENT_AMBULATORY_CARE_PROVIDER_SITE_OTHER): Payer: Self-pay

## 2015-11-16 ENCOUNTER — Ambulatory Visit (INDEPENDENT_AMBULATORY_CARE_PROVIDER_SITE_OTHER): Payer: Managed Care, Other (non HMO) | Admitting: Internal Medicine

## 2015-11-16 VITALS — BP 108/66 | HR 74 | Temp 98.0°F | Resp 16 | Ht 66.0 in | Wt 205.0 lb

## 2015-11-16 DIAGNOSIS — M7542 Impingement syndrome of left shoulder: Secondary | ICD-10-CM | POA: Diagnosis not present

## 2015-11-16 DIAGNOSIS — R7309 Other abnormal glucose: Secondary | ICD-10-CM | POA: Diagnosis not present

## 2015-11-16 DIAGNOSIS — Z79899 Other long term (current) drug therapy: Secondary | ICD-10-CM | POA: Diagnosis not present

## 2015-11-16 DIAGNOSIS — E785 Hyperlipidemia, unspecified: Secondary | ICD-10-CM | POA: Diagnosis not present

## 2015-11-16 DIAGNOSIS — F1010 Alcohol abuse, uncomplicated: Secondary | ICD-10-CM | POA: Diagnosis not present

## 2015-11-16 DIAGNOSIS — E559 Vitamin D deficiency, unspecified: Secondary | ICD-10-CM | POA: Diagnosis not present

## 2015-11-16 DIAGNOSIS — I10 Essential (primary) hypertension: Secondary | ICD-10-CM | POA: Diagnosis not present

## 2015-11-16 LAB — BASIC METABOLIC PANEL WITH GFR
BUN: 26 mg/dL — AB (ref 7–25)
CHLORIDE: 102 mmol/L (ref 98–110)
CO2: 27 mmol/L (ref 20–31)
Calcium: 9.5 mg/dL (ref 8.6–10.4)
Creat: 0.95 mg/dL (ref 0.50–1.05)
GFR, EST AFRICAN AMERICAN: 80 mL/min (ref >=60)
GFR, EST NON AFRICAN AMERICAN: 70 mL/min (ref >=60)
Glucose, Bld: 87 mg/dL (ref 65–99)
POTASSIUM: 5.1 mmol/L (ref 3.5–5.3)
Sodium: 137 mmol/L (ref 135–146)

## 2015-11-16 LAB — HEPATIC FUNCTION PANEL
ALBUMIN: 4.2 g/dL (ref 3.6–5.1)
ALK PHOS: 99 U/L (ref 33–130)
ALT: 21 U/L (ref 6–29)
AST: 28 U/L (ref 10–35)
BILIRUBIN DIRECT: 0.1 mg/dL (ref <=0.2)
BILIRUBIN INDIRECT: 0.3 mg/dL (ref 0.2–1.2)
BILIRUBIN TOTAL: 0.4 mg/dL (ref 0.2–1.2)
Total Protein: 7.1 g/dL (ref 6.1–8.1)

## 2015-11-16 LAB — CBC WITH DIFFERENTIAL/PLATELET
BASOS PCT: 1 %
Basophils Absolute: 65 cells/uL (ref 0–200)
EOS ABS: 130 {cells}/uL (ref 15–500)
Eosinophils Relative: 2 %
HCT: 39.3 % (ref 35.0–45.0)
Hemoglobin: 13.3 g/dL (ref 11.7–15.5)
Lymphocytes Relative: 21 %
Lymphs Abs: 1365 cells/uL (ref 850–3900)
MCH: 32.8 pg (ref 27.0–33.0)
MCHC: 33.8 g/dL (ref 32.0–36.0)
MCV: 97 fL (ref 80.0–100.0)
MONO ABS: 780 {cells}/uL (ref 200–950)
MONOS PCT: 12 %
MPV: 10.3 fL (ref 7.5–12.5)
NEUTROS ABS: 4160 {cells}/uL (ref 1500–7800)
Neutrophils Relative %: 64 %
PLATELETS: 257 10*3/uL (ref 140–400)
RBC: 4.05 MIL/uL (ref 3.80–5.10)
RDW: 14.7 % (ref 11.0–15.0)
WBC: 6.5 10*3/uL (ref 3.8–10.8)

## 2015-11-16 LAB — LIPID PANEL
CHOL/HDL RATIO: 2.8 ratio (ref <=5.0)
Cholesterol: 237 mg/dL — ABNORMAL HIGH (ref 125–200)
HDL: 86 mg/dL (ref >=46)
LDL Cholesterol: 107 mg/dL (ref <130)
Triglycerides: 222 mg/dL — ABNORMAL HIGH (ref <150)
VLDL: 44 mg/dL — AB (ref <30)

## 2015-11-16 LAB — HEMOGLOBIN A1C
HEMOGLOBIN A1C: 5.4 % (ref <5.7)
MEAN PLASMA GLUCOSE: 108 mg/dL

## 2015-11-16 LAB — TSH: TSH: 1 mIU/L

## 2015-11-16 MED ORDER — AMLODIPINE BESYLATE 10 MG PO TABS: 10.0000 mg | ORAL_TABLET | Freq: Every day | ORAL | Status: DC

## 2015-11-16 MED ORDER — CITALOPRAM HYDROBROMIDE 40 MG PO TABS: 40.0000 mg | ORAL_TABLET | Freq: Every day | ORAL | Status: DC

## 2015-11-16 MED ORDER — HYDROXYCHLOROQUINE SULFATE 200 MG PO TABS: 200.0000 mg | ORAL_TABLET | Freq: Two times a day (BID) | ORAL | Status: DC

## 2015-11-16 MED ORDER — SUCRALFATE 1 G PO TABS: 1.0000 g | ORAL_TABLET | Freq: Three times a day (TID) | ORAL | Status: DC

## 2015-11-16 MED ORDER — BUPROPION HCL ER (XL) 150 MG PO TB24: 150.0000 mg | ORAL_TABLET | ORAL | Status: DC

## 2015-11-16 MED ORDER — LOSARTAN POTASSIUM 100 MG PO TABS: ORAL_TABLET | ORAL | Status: DC

## 2015-11-16 NOTE — Progress Notes (Signed)
Assessment and Plan:  Hypertension:  -Continue medication,  -monitor blood pressure at home.  -Continue DASH diet.   -Reminder to go to the ER if any CP, SOB, nausea, dizziness, severe HA, changes vision/speech, left arm numbness and tingling, and jaw pain.  Cholesterol: -Continue diet and exercise.  -Check cholesterol.   Pre-diabetes: -Continue diet and exercise.  -Check A1C  Vitamin D Def: -check level -continue medications.   Alcohol abuse -patient reports being sober since March 16th of this year -she is following with the liver clinic in [place] and is having improvements of her LFTs off alcohol -she is going to at least 6 AA meetings per week -her husband is being very supportive of her -she is trying to get her license back currently.  Left sided shoulder pain -refer to ortho [place] -likely bursitis vs impingement causing referred pain to her neck.    Continue diet and meds as discussed. Further disposition pending results of labs.  HPI 50 y.o. female  presents for 3 month follow up with hypertension, hyperlipidemia, prediabetes and vitamin D.   Her blood pressure has been controlled at home, today their BP is BP: 108/66 mmHg.   She does not workout. She denies chest pain, shortness of breath, dizziness.  She notes that her blood pressure still vacillates, but has been easier to control.     She is on cholesterol medication and denies myalgias. Her cholesterol is at goal. The cholesterol last visit was:   Lab Results  Component Value Date   CHOL 189 03/18/2015   HDL 75 03/18/2015   LDLCALC 95 03/18/2015   TRIG 93 03/18/2015   CHOLHDL 2.5 03/18/2015     She has been working on diet and exercise for prediabetes, and denies foot ulcerations, hyperglycemia, hypoglycemia , increased appetite, nausea, paresthesia of the feet, polydipsia, polyuria, visual disturbances, vomiting and weight loss. Last A1C in the office was:  Lab Results  Component Value Date    HGBA1C 5.3 08/21/2013    Patient is on Vitamin D supplement.  Lab Results  Component Value Date   VD25OH 39 12/26/2014     Patient reports that she has been going to [employer]  She reports that she has been sober since March 16th.  She notes that she did get set up with her new doctor Dr. Pearlean Brownie.  She notes that he feels like her her liver enzymes are due to alcohol abuse and not necessarily related to cirrhosis.  They also feel like the hallucinations are secondary to alcohol withdrawal.   She notes that she is having some left sided neck and shoulder strain.  She reports that she thinks that she developed this from her gardening.  She has some relief with.  She has been taking aleve 2 tablets twice daily.  She has never done PT for it.    Current Medications:  Current Outpatient Prescriptions on File Prior to Visit  Medication Sig Dispense Refill  . acyclovir (ZOVIRAX) 400 MG tablet TAKE 1 TABLET (400 MG TOTAL) BY MOUTH 2 (TWO) TIMES DAILY. 180 tablet 0  . ALPRAZolam (XANAX) 0.25 MG tablet Take 1 tablet (0.25 mg total) by mouth 3 (three) times daily as needed for anxiety. 60 tablet 0  . amLODipine (NORVASC) 10 MG tablet TAKE 1 TABLET (10 MG TOTAL) BY MOUTH DAILY. 30 tablet 0  . atenolol (TENORMIN) 100 MG tablet TAKE 1 TABLET BY MOUTH TWICE DAILY 180 tablet 0  . buPROPion (WELLBUTRIN XL) 150 MG 24 hr tablet  Take 1 tablet (150 mg total) by mouth every morning. Medication cannot be mixed with alcohol 30 tablet 2  . citalopram (CELEXA) 40 MG tablet Take 40 mg by mouth daily.    . hydroxychloroquine (PLAQUENIL) 200 MG tablet Take 1 tablet (200 mg total) by mouth 2 (two) times daily. 60 tablet 1  . hydroxypropyl methylcellulose / hypromellose (ISOPTO TEARS / GONIOVISC) 2.5 % ophthalmic solution Place 1 drop into both eyes 3 (three) times daily as needed for dry eyes.    Marland Kitchen losartan (COZAAR) 100 MG tablet TAKE 1 TABLET (100 MG TOTAL) BY MOUTH DAILY. 30 tablet 3  . promethazine (PHENERGAN) 25 MG  tablet Take 25 mg by mouth every 6 (six) hours as needed.    . sucralfate (CARAFATE) 1 g tablet TAKE 1 TABLET BY MOUTH BEFORE MEALS AND AT NIGHT 120 tablet 0   No current facility-administered medications on file prior to visit.    Medical History:  Past Medical History  Diagnosis Date  . Neuromuscular disorder (HCC)   . Leg pain, right   . Anxiety   . Rheumatoid arthritis involving multiple joints (HCC)     rheumatoid arthritis- hands, degeneration of lumbar  spine   . GERD (gastroesophageal reflux disease)   . Mental disorder   . Eye irritation     R eye- redness- from removing contact lense  . Hypertension   . Hyperlipidemia   . Allergy   . Herpes simplex type II infection   . Obesity   . Depression   . Alcoholism (HCC)     Allergies:  Allergies  Allergen Reactions  . Codeine Itching     Review of Systems:  Review of Systems  Constitutional: Negative for fever, chills and malaise/fatigue.  HENT: Negative for congestion, ear pain and sore throat.   Respiratory: Negative for cough, shortness of breath and wheezing.   Cardiovascular: Negative for chest pain, palpitations and leg swelling.  Gastrointestinal: Negative for heartburn, abdominal pain, diarrhea, constipation, blood in stool and melena.  Genitourinary: Negative.   Neurological: Negative for dizziness, loss of consciousness and headaches.  Psychiatric/Behavioral: Negative for depression. The patient is not nervous/anxious and does not have insomnia.     Family history- Review and unchanged  Social history- Review and unchanged  Physical Exam: BP 108/66 mmHg  Pulse 74  Temp(Src) 98 F (36.7 C) (Temporal)  Resp 16  Ht 5\' 6"  (1.676 m)  Wt 205 lb (92.987 kg)  BMI 33.10 kg/m2  LMP 06/14/2012 Wt Readings from Last 3 Encounters:  11/16/15 205 lb (92.987 kg)  08/11/15 189 lb (85.73 kg)  07/07/15 186 lb 12.8 oz (84.732 kg)    General Appearance: Well nourished well developed, in no apparent  distress. Eyes: PERRLA, EOMs, conjunctiva no swelling or erythema ENT/Mouth: Ear canals normal without obstruction, swelling, erythma, discharge.  TMs normal bilaterally.  Oropharynx moist, clear, without exudate, or postoropharyngeal swelling. Neck: Supple, thyroid normal,no cervical adenopathy  Respiratory: Respiratory effort normal, Breath sounds clear A&P without rhonchi, wheeze, or rale.  No retractions, no accessory usage. Cardio: RRR with no MRGs. Brisk peripheral pulses without edema.  Abdomen: Soft, + BS,  Non tender, no guarding, rebound, hernias, masses. Musculoskeletal: Full ROM, 5/5 strength, Normal gait Skin: Warm, dry without rashes, lesions, ecchymosis.  Neuro: Awake and oriented X 3, Cranial nerves intact. Normal muscle tone, no cerebellar symptoms. Psych: Normal affect, Insight and Judgment appropriate.    09/04/15, PA-C 2:52 PM [facility]

## 2015-11-17 ENCOUNTER — Other Ambulatory Visit: Payer: Self-pay | Admitting: Internal Medicine

## 2015-11-25 ENCOUNTER — Other Ambulatory Visit: Payer: Self-pay | Admitting: Internal Medicine

## 2015-11-25 NOTE — Telephone Encounter (Signed)
[facility] 

## 2015-12-19 ENCOUNTER — Other Ambulatory Visit: Payer: Self-pay | Admitting: Physician Assistant

## 2015-12-22 ENCOUNTER — Other Ambulatory Visit: Payer: Self-pay | Admitting: Internal Medicine

## 2015-12-22 DIAGNOSIS — F411 Generalized anxiety disorder: Secondary | ICD-10-CM

## 2015-12-22 MED ORDER — ALPRAZOLAM 0.25 MG PO TABS: ORAL_TABLET | ORAL | 0 refills | Status: AC

## 2016-02-23 ENCOUNTER — Ambulatory Visit: Payer: Self-pay | Admitting: Internal Medicine

## 2016-03-28 ENCOUNTER — Ambulatory Visit (INDEPENDENT_AMBULATORY_CARE_PROVIDER_SITE_OTHER): Payer: PRIVATE HEALTH INSURANCE | Admitting: Internal Medicine

## 2016-03-28 VITALS — BP 136/64 | HR 74 | Temp 97.5°F | Resp 16 | Ht 66.0 in | Wt 209.0 lb

## 2016-03-28 DIAGNOSIS — E782 Mixed hyperlipidemia: Secondary | ICD-10-CM

## 2016-03-28 DIAGNOSIS — Z79899 Other long term (current) drug therapy: Secondary | ICD-10-CM

## 2016-03-28 DIAGNOSIS — I10 Essential (primary) hypertension: Secondary | ICD-10-CM | POA: Diagnosis not present

## 2016-03-28 DIAGNOSIS — K7400 Hepatic fibrosis, unspecified: Secondary | ICD-10-CM

## 2016-03-28 DIAGNOSIS — R7303 Prediabetes: Secondary | ICD-10-CM | POA: Diagnosis not present

## 2016-03-28 DIAGNOSIS — Z6832 Body mass index (BMI) 32.0-32.9, adult: Secondary | ICD-10-CM

## 2016-03-28 DIAGNOSIS — E559 Vitamin D deficiency, unspecified: Secondary | ICD-10-CM | POA: Diagnosis not present

## 2016-03-28 DIAGNOSIS — E6609 Other obesity due to excess calories: Secondary | ICD-10-CM

## 2016-03-28 LAB — CBC WITH DIFFERENTIAL/PLATELET
BASOS ABS: 58 {cells}/uL (ref 0–200)
Basophils Relative: 1 %
EOS ABS: 174 {cells}/uL (ref 15–500)
Eosinophils Relative: 3 %
HCT: 38.1 % (ref 35.0–45.0)
Hemoglobin: 12.6 g/dL (ref 11.7–15.5)
Lymphocytes Relative: 19 %
Lymphs Abs: 1102 cells/uL (ref 850–3900)
MCH: 32.1 pg (ref 27.0–33.0)
MCHC: 33.1 g/dL (ref 32.0–36.0)
MCV: 96.9 fL (ref 80.0–100.0)
MONOS PCT: 19 %
MPV: 10.3 fL (ref 7.5–12.5)
Monocytes Absolute: 1102 cells/uL — ABNORMAL HIGH (ref 200–950)
NEUTROS ABS: 3364 {cells}/uL (ref 1500–7800)
NEUTROS PCT: 58 %
PLATELETS: 250 10*3/uL (ref 140–400)
RBC: 3.93 MIL/uL (ref 3.80–5.10)
RDW: 15.1 % — ABNORMAL HIGH (ref 11.0–15.0)
WBC: 5.8 10*3/uL (ref 3.8–10.8)

## 2016-03-28 NOTE — Patient Instructions (Signed)
Try looking into [employer] or Cook[employer] for some recipe ideas.  You also will benefit from writing foods down.  There are also a lot of weight watchers recipes available for free online.  Try taking a peak at those.

## 2016-03-28 NOTE — Progress Notes (Signed)
Assessment and Plan:  Hypertension:  -Continue medication,  -monitor blood pressure at home.  -Continue DASH diet.   -Reminder to go to the ER if any CP, SOB, nausea, dizziness, severe HA, changes vision/speech, left arm numbness and tingling, and jaw pain.  Cholesterol: -Continue diet and exercise.  -Check cholesterol.   Pre-diabetes: -Continue diet and exercise.  -Check A1C  Vitamin D Def: -check level -continue medications.   Chronic alcoholism -still going to AA meetings -LFTs -appears to be doing very well  Weight loss -discussed diet today -will avoid any controlled substances given addiction issues -exercise as tolerated  Neck pain -followed by orthocarolina  Continue diet and meds as discussed. Further disposition pending results of labs.  HPI 50 y.o. female  presents for 3 month follow up with hypertension, hyperlipidemia, prediabetes and vitamin D.   Her blood pressure has been controlled at home, today their BP is BP: 136/64.   She does workout. She denies chest pain, shortness of breath, dizziness.   She is not on cholesterol medication and denies myalgias. Her cholesterol is at goal. The cholesterol last visit was:   Lab Results  Component Value Date   CHOL 237 (H) 11/16/2015   HDL 86 11/16/2015   LDLCALC 107 11/16/2015   TRIG 222 (H) 11/16/2015   CHOLHDL 2.8 11/16/2015     She has been working on diet and exercise for prediabetes, and denies foot ulcerations, hyperglycemia, hypoglycemia , increased appetite, nausea, paresthesia of the feet, polydipsia, polyuria, visual disturbances, vomiting and weight loss. Last A1C in the office was:  Lab Results  Component Value Date   HGBA1C 5.4 11/16/2015    Patient is on Vitamin D supplement.  Lab Results  Component Value Date   VD25OH 39 12/26/2014     She reports that she is seeing a doctor at [facility].  She reports that she really enjoyed the doctor.  She is taking skelaxin and this is helping.   She did do two injections.  She reports that eventually they think that they will need to do a surgery on her neck.  She reports that she is currently trying to lose weight.  She is doing nutrisystem.  She reports that she is doing this for a month.  She wants to do weight watchers instead.  She is also walking on the treadmill.    She is not currently taking any kind of liver medications.  She is not currently drinking alcohol.  She is not seeing a therapist currently. She is going to AA 3 times per week.  She reports that she is enjoying it and meeting people there.    Current Medications:  Current Outpatient Prescriptions on File Prior to Visit  Medication Sig Dispense Refill  . acyclovir (ZOVIRAX) 400 MG tablet TAKE 1 TABLET (400 MG TOTAL) BY MOUTH 2 (TWO) TIMES DAILY. 180 tablet 0  . amLODipine (NORVASC) 10 MG tablet Take 1 tablet (10 mg total) by mouth daily. 90 tablet 0  . atenolol (TENORMIN) 100 MG tablet TAKE 1 TABLET BY MOUTH TWICE DAILY 180 tablet 0  . buPROPion (WELLBUTRIN XL) 150 MG 24 hr tablet Take 1 tablet (150 mg total) by mouth every morning. Medication cannot be mixed with alcohol 90 tablet 2  . citalopram (CELEXA) 40 MG tablet Take 1 tablet (40 mg total) by mouth daily. 90 tablet 1  . hydroxychloroquine (PLAQUENIL) 200 MG tablet Take 1 tablet (200 mg total) by mouth 2 (two) times daily. 60 tablet 1  .  hydroxypropyl methylcellulose / hypromellose (ISOPTO TEARS / GONIOVISC) 2.5 % ophthalmic solution Place 1 drop into both eyes 3 (three) times daily as needed for dry eyes.    Marland Kitchen losartan (COZAAR) 100 MG tablet TAKE 1 TABLET (100 MG TOTAL) BY MOUTH DAILY. 90 tablet 1  . promethazine (PHENERGAN) 25 MG tablet Take 25 mg by mouth every 6 (six) hours as needed.    . sucralfate (CARAFATE) 1 g tablet Take 1 tablet (1 g total) by mouth 4 (four) times daily -  with meals and at bedtime. 120 tablet 1   No current facility-administered medications on file prior to visit.     Medical  History:  Past Medical History:  Diagnosis Date  . Alcoholism (HCC)   . Allergy   . Anxiety   . Depression   . Eye irritation    R eye- redness- from removing contact lense  . GERD (gastroesophageal reflux disease)   . Herpes simplex type II infection   . Hyperlipidemia   . Hypertension   . Leg pain, right   . Mental disorder   . Neuromuscular disorder (HCC)   . Obesity   . Rheumatoid arthritis involving multiple joints (HCC)    rheumatoid arthritis- hands, degeneration of lumbar  spine     Allergies:  Allergies  Allergen Reactions  . Codeine Itching     Review of Systems:  Review of Systems  Constitutional: Negative for chills, fever and malaise/fatigue.  HENT: Negative for congestion, ear pain and sore throat.   Eyes: Negative.   Respiratory: Negative for cough, shortness of breath and wheezing.   Cardiovascular: Negative for chest pain, palpitations and leg swelling.  Gastrointestinal: Negative for abdominal pain, blood in stool, constipation, diarrhea, heartburn and melena.  Genitourinary: Negative.   Skin: Negative.   Neurological: Negative for dizziness, sensory change, loss of consciousness and headaches.  Psychiatric/Behavioral: Negative for depression. The patient is not nervous/anxious and does not have insomnia.     Family history- Review and unchanged  Social history- Review and unchanged  Physical Exam: BP 136/64   Pulse 74   Temp 97.5 F (36.4 C)   Resp 16   Ht 5\' 6"  (1.676 m)   Wt 209 lb (94.8 kg)   LMP 06/14/2012   SpO2 98%   BMI 33.73 kg/m  Wt Readings from Last 3 Encounters:  03/28/16 209 lb (94.8 kg)  11/16/15 205 lb (93 kg)  08/11/15 189 lb (85.7 kg)    General Appearance: Well nourished well developed, in no apparent distress. Eyes: PERRLA, EOMs, conjunctiva no swelling or erythema ENT/Mouth: Ear canals normal without obstruction, swelling, erythma, discharge.  TMs normal bilaterally.  Oropharynx moist, clear, without exudate, or  postoropharyngeal swelling. Neck: Supple, thyroid normal,no cervical adenopathy  Respiratory: Respiratory effort normal, Breath sounds clear A&P without rhonchi, wheeze, or rale.  No retractions, no accessory usage. Cardio: RRR with no MRGs. Brisk peripheral pulses without edema.  Abdomen: Soft, + BS,  Non tender, no guarding, rebound, hernias, masses. Musculoskeletal: Full ROM, 5/5 strength, Normal gait Skin: Warm, dry without rashes, lesions, ecchymosis.  Neuro: Awake and oriented X 3, Cranial nerves intact. Normal muscle tone, no cerebellar symptoms. Psych: Normal affect, Insight and Judgment appropriate.    08/13/15, PA-C 4:04 PM [facility]

## 2016-03-29 ENCOUNTER — Other Ambulatory Visit: Payer: Self-pay | Admitting: Internal Medicine

## 2016-03-29 LAB — HEPATIC FUNCTION PANEL
ALBUMIN: 4.2 g/dL (ref 3.6–5.1)
ALT: 35 U/L — AB (ref 6–29)
AST: 34 U/L (ref 10–35)
Alkaline Phosphatase: 74 U/L (ref 33–130)
BILIRUBIN TOTAL: 0.5 mg/dL (ref 0.2–1.2)
Bilirubin, Direct: 0.1 mg/dL (ref <=0.2)
Indirect Bilirubin: 0.4 mg/dL (ref 0.2–1.2)
Total Protein: 7.1 g/dL (ref 6.1–8.1)

## 2016-03-29 LAB — LIPID PANEL
CHOLESTEROL: 218 mg/dL — AB (ref 125–200)
HDL: 75 mg/dL (ref >=46)
LDL Cholesterol: 126 mg/dL (ref <130)
Total CHOL/HDL Ratio: 2.9 Ratio (ref <=5.0)
Triglycerides: 85 mg/dL (ref <150)
VLDL: 17 mg/dL (ref <30)

## 2016-03-29 LAB — BASIC METABOLIC PANEL WITH GFR
BUN: 19 mg/dL (ref 7–25)
CALCIUM: 9.6 mg/dL (ref 8.6–10.4)
CHLORIDE: 104 mmol/L (ref 98–110)
CO2: 23 mmol/L (ref 20–31)
CREATININE: 0.82 mg/dL (ref 0.50–1.05)
GFR, Est African American: >89 mL/min (ref >=60)
GFR, Est Non African American: 83 mL/min (ref >=60)
Glucose, Bld: 96 mg/dL (ref 65–99)
Potassium: 4.9 mmol/L (ref 3.5–5.3)
Sodium: 136 mmol/L (ref 135–146)

## 2016-03-29 LAB — HEMOGLOBIN A1C
Hgb A1c MFr Bld: 5.1 % (ref <5.7)
MEAN PLASMA GLUCOSE: 100 mg/dL

## 2016-03-29 LAB — TSH: TSH: 1.33 m[IU]/L

## 2016-04-10 IMAGING — CT CT HEAD W/O CM
2 series · 16 of 30 positions shown, 20 images · non-contrast
Comparison: None.

CLINICAL DATA: Altered mental status and hallucinations today.

EXAM:
CT HEAD WITHOUT CONTRAST
TECHNIQUE: Contiguous axial images were obtained from the base of the skull
through the vertex without intravenous contrast.

[Series 2: head w/o · axial · non-contrast · 0.43mm/px · z∈[-175,-45]mm · 13 of 32 slices shown, 17 images]
[im 3/32  brain]
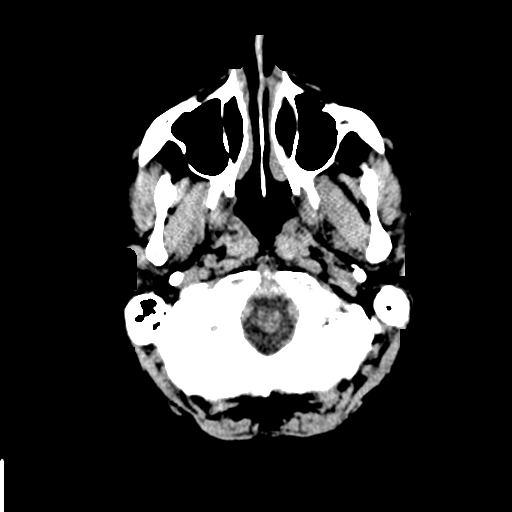
[im 3/32  bone]
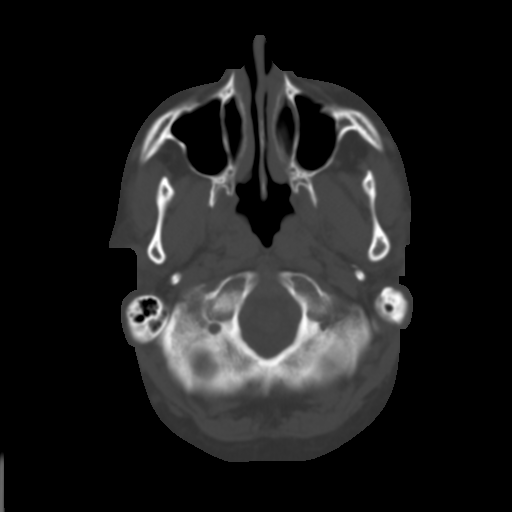
[im 5/32  brain]
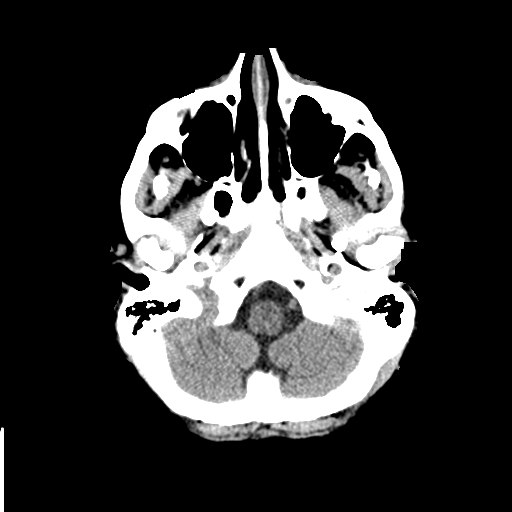
[im 7/32  brain]
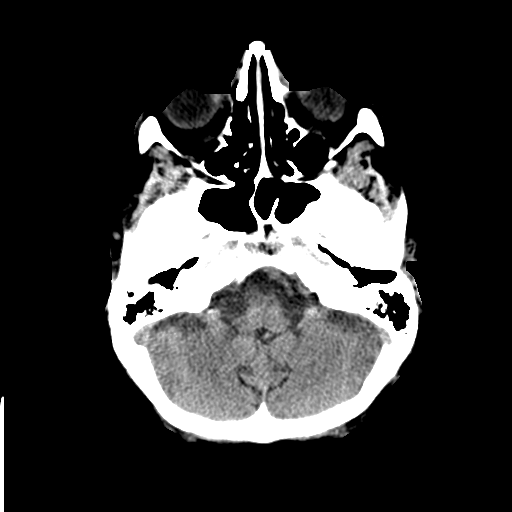
[im 9/32  brain]
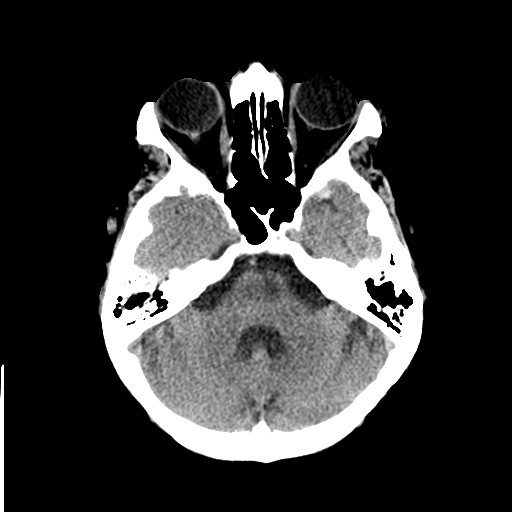
[im 12/32  brain]
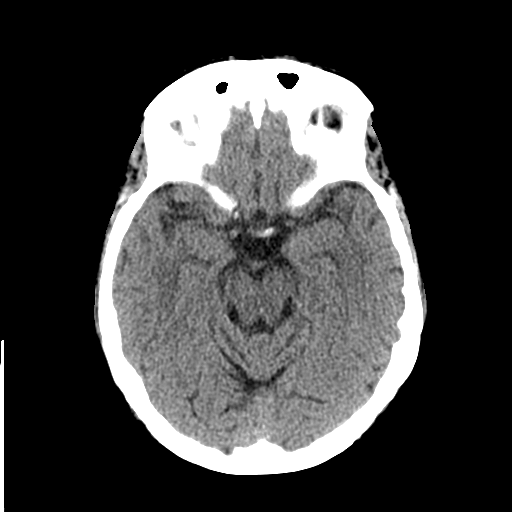
[im 12/32  bone]
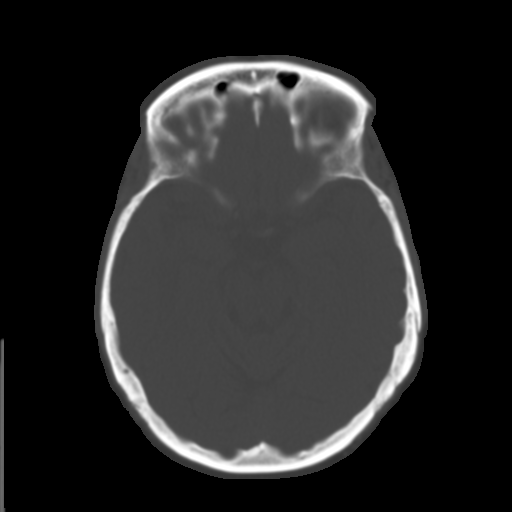
[im 14/32  brain]
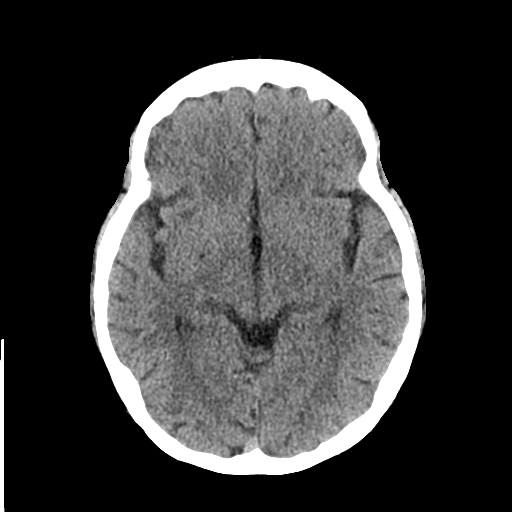
[im 16/32  brain]
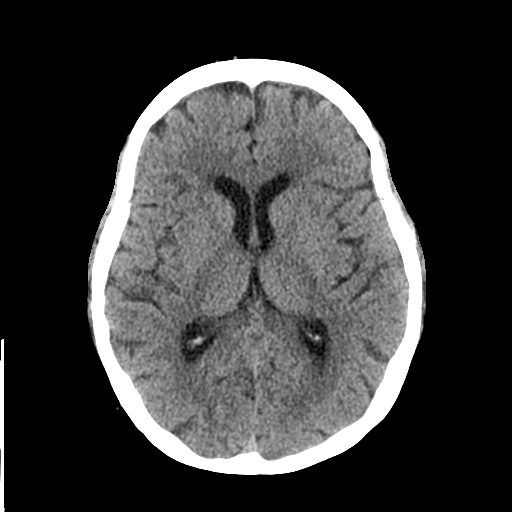
[im 18/32  brain]
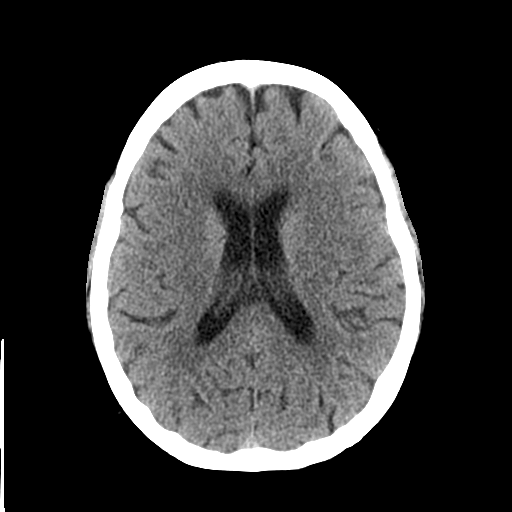
[im 20/32  brain]
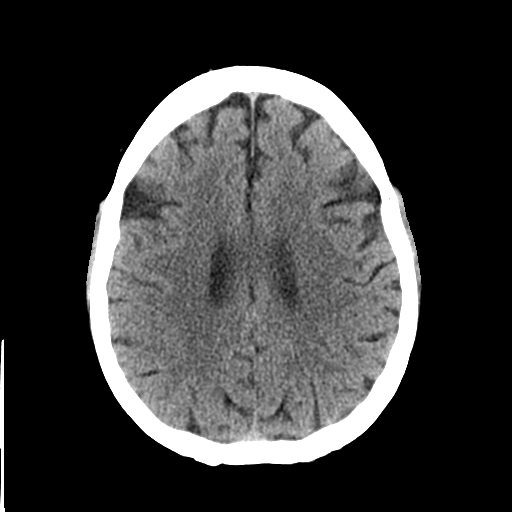
[im 20/32  bone]
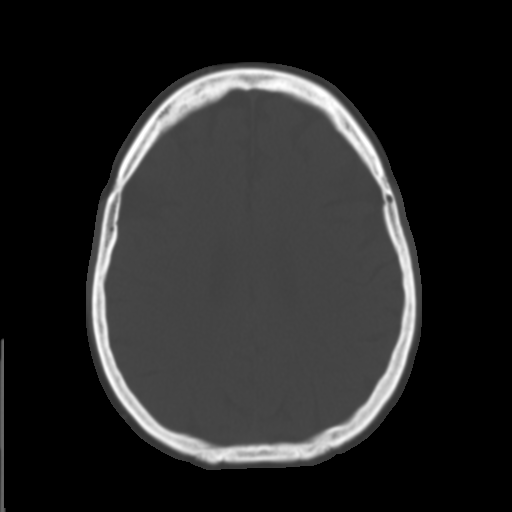
[im 23/32  brain]
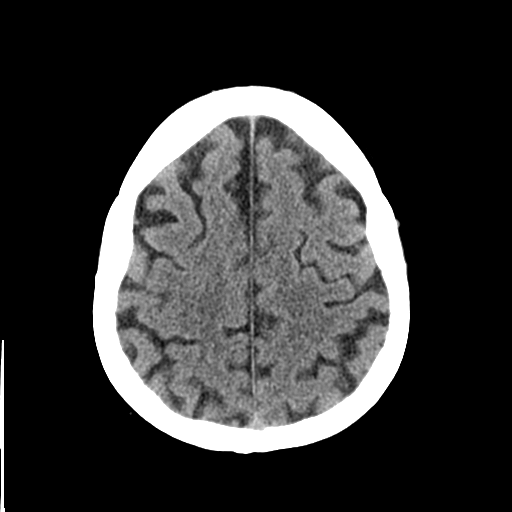
[im 25/32  brain]
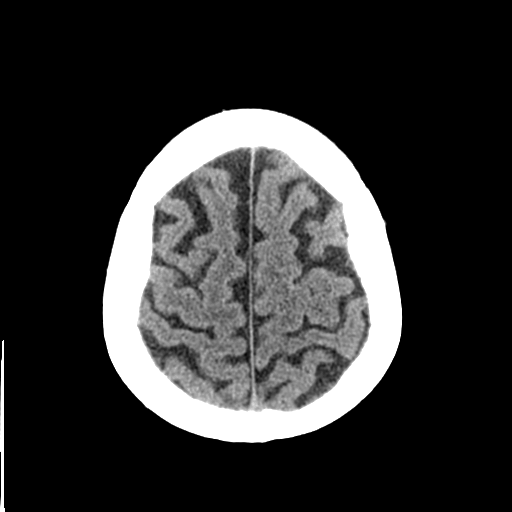
[im 27/32  brain]
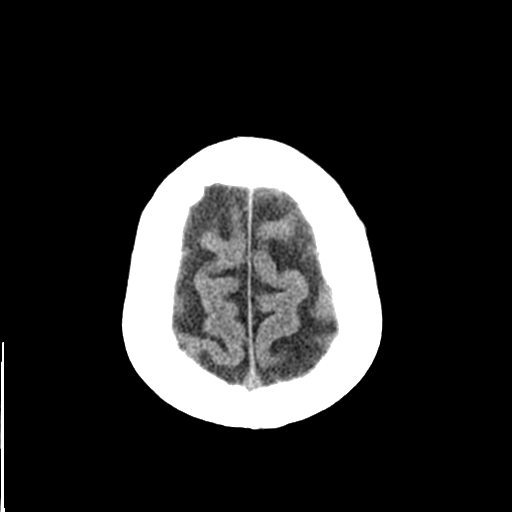
[im 29/32  brain]
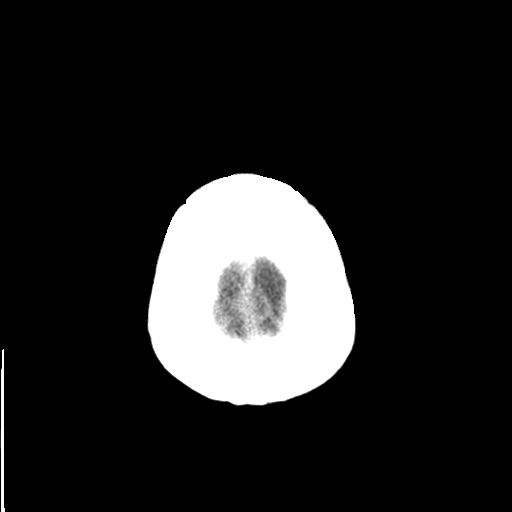
[im 29/32  bone]
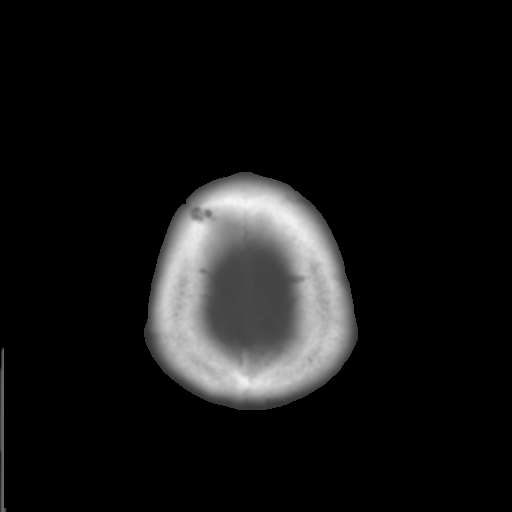

[Series 3: bone windows · axial · 0.43mm/px · z∈[-175,-130]mm · 3 of 32 slices shown]
[im 3/32  bone]
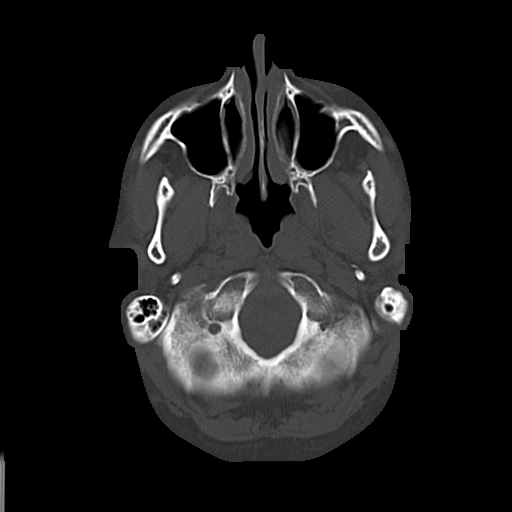
[im 7/32  bone]
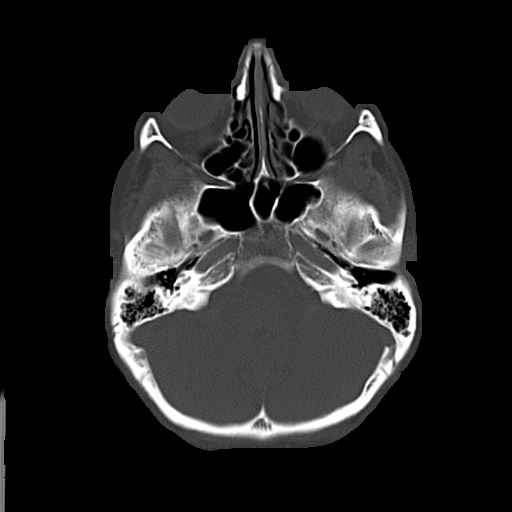
[im 12/32  bone]
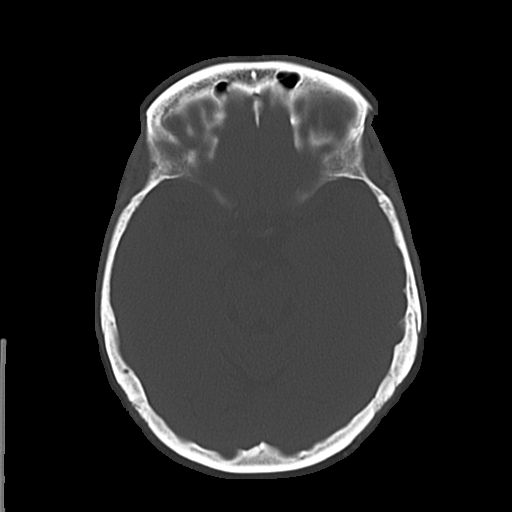

[16 of 30 positions shown; findings below may reference images not displayed]

FINDINGS: There is no evidence of acute intracranial abnormality including
hemorrhage, infarct, mass lesion, mass effect, midline shift or
abnormal extra-axial fluid collection. No hydrocephalus or
pneumocephalus. The calvarium is intact. Imaged paranasal sinuses
and mastoid air cells are clear.
IMPRESSION: Negative head CT.

## 2016-05-24 ENCOUNTER — Other Ambulatory Visit: Payer: Self-pay | Admitting: Internal Medicine

## 2016-05-26 ENCOUNTER — Encounter: Payer: Self-pay | Admitting: *Deleted

## 2016-06-13 ENCOUNTER — Encounter: Payer: Self-pay | Admitting: Internal Medicine

## 2016-06-13 ENCOUNTER — Other Ambulatory Visit: Payer: Self-pay | Admitting: Internal Medicine

## 2016-06-13 MED ORDER — ONDANSETRON 8 MG PO TBDP: ORAL_TABLET | ORAL | 0 refills | Status: DC

## 2016-06-15 ENCOUNTER — Ambulatory Visit: Payer: Self-pay | Admitting: Internal Medicine

## 2016-06-20 ENCOUNTER — Other Ambulatory Visit: Payer: Self-pay | Admitting: Internal Medicine

## 2016-06-20 NOTE — Telephone Encounter (Signed)
Xanax was called into pharmacy 

## 2016-06-23 ENCOUNTER — Other Ambulatory Visit: Payer: Self-pay | Admitting: Internal Medicine

## 2016-06-23 DIAGNOSIS — K219 Gastro-esophageal reflux disease without esophagitis: Secondary | ICD-10-CM

## 2016-06-29 ENCOUNTER — Encounter: Payer: Self-pay | Admitting: Physician Assistant

## 2016-06-29 ENCOUNTER — Ambulatory Visit (INDEPENDENT_AMBULATORY_CARE_PROVIDER_SITE_OTHER): Payer: PRIVATE HEALTH INSURANCE | Admitting: Physician Assistant

## 2016-06-29 ENCOUNTER — Ambulatory Visit (HOSPITAL_COMMUNITY)
Admission: RE | Admit: 2016-06-29 | Discharge: 2016-06-29 | Disposition: A | Discharge status: Home / Self Care | Payer: PRIVATE HEALTH INSURANCE | Source: Ambulatory Visit | Attending: Physician Assistant | Admitting: Physician Assistant

## 2016-06-29 VITALS — BP 118/80 | HR 89 | Temp 97.2°F | Resp 16 | Ht 66.0 in | Wt 209.4 lb

## 2016-06-29 DIAGNOSIS — I10 Essential (primary) hypertension: Secondary | ICD-10-CM

## 2016-06-29 DIAGNOSIS — Z6832 Body mass index (BMI) 32.0-32.9, adult: Secondary | ICD-10-CM

## 2016-06-29 DIAGNOSIS — Z79899 Other long term (current) drug therapy: Secondary | ICD-10-CM | POA: Diagnosis not present

## 2016-06-29 DIAGNOSIS — R0600 Dyspnea, unspecified: Secondary | ICD-10-CM

## 2016-06-29 DIAGNOSIS — R7309 Other abnormal glucose: Secondary | ICD-10-CM

## 2016-06-29 DIAGNOSIS — K760 Fatty (change of) liver, not elsewhere classified: Secondary | ICD-10-CM

## 2016-06-29 DIAGNOSIS — E559 Vitamin D deficiency, unspecified: Secondary | ICD-10-CM | POA: Diagnosis not present

## 2016-06-29 DIAGNOSIS — E538 Deficiency of other specified B group vitamins: Secondary | ICD-10-CM

## 2016-06-29 DIAGNOSIS — K7682 Hepatic encephalopathy: Secondary | ICD-10-CM

## 2016-06-29 DIAGNOSIS — K7290 Hepatic failure, unspecified without coma: Secondary | ICD-10-CM

## 2016-06-29 DIAGNOSIS — E782 Mixed hyperlipidemia: Secondary | ICD-10-CM | POA: Diagnosis not present

## 2016-06-29 DIAGNOSIS — F1010 Alcohol abuse, uncomplicated: Secondary | ICD-10-CM

## 2016-06-29 DIAGNOSIS — E6609 Other obesity due to excess calories: Secondary | ICD-10-CM

## 2016-06-29 LAB — CBC WITH DIFFERENTIAL/PLATELET
BASOS PCT: 3 %
Basophils Absolute: 168 cells/uL (ref 0–200)
EOS ABS: 280 {cells}/uL (ref 15–500)
Eosinophils Relative: 5 %
HEMATOCRIT: 38.1 % (ref 35.0–45.0)
HEMOGLOBIN: 12.5 g/dL (ref 11.7–15.5)
LYMPHS ABS: 1512 {cells}/uL (ref 850–3900)
LYMPHS PCT: 27 %
MCH: 31.4 pg (ref 27.0–33.0)
MCHC: 32.8 g/dL (ref 32.0–36.0)
MCV: 95.7 fL (ref 80.0–100.0)
MONO ABS: 616 {cells}/uL (ref 200–950)
MPV: 10.6 fL (ref 7.5–12.5)
Monocytes Relative: 11 %
NEUTROS PCT: 54 %
Neutro Abs: 3024 cells/uL (ref 1500–7800)
Platelets: 374 10*3/uL (ref 140–400)
RBC: 3.98 MIL/uL (ref 3.80–5.10)
RDW: 15 % (ref 11.0–15.0)
WBC: 5.6 10*3/uL (ref 3.8–10.8)

## 2016-06-29 LAB — TSH: TSH: 1.2 mIU/L

## 2016-06-29 LAB — FERRITIN: Ferritin: 105 ng/mL (ref 10–232)

## 2016-06-29 LAB — HEMOGLOBIN A1C
HEMOGLOBIN A1C: 5.3 % (ref <5.7)
MEAN PLASMA GLUCOSE: 105 mg/dL

## 2016-06-29 LAB — VITAMIN B12: Vitamin B-12: 609 pg/mL (ref 200–1100)

## 2016-06-29 NOTE — Progress Notes (Signed)
Assessment and Plan:  Essential hypertension - continue medications, DASH diet, exercise and monitor at home. Call if greater than 130/80.  -     CBC with Differential/Platelet -     BASIC METABOLIC PANEL WITH GFR -     TSH -     Hepatic function panel  Hepatic steatosis Continue weight loss -     CK -     Hepatic function panel -     Vitamin B6  Hepatic encephalopathy (HCC) ? Another episode with recent hallucinations versus dehydration, check labs -     CK -     AMMONIA -     Gamma GT -     Hepatic function panel -     Vitamin B6  Mixed hyperlipidemia -continue medications, check lipids, decrease fatty foods, increase activity.  -     Lipid panel  Morbid Obesity with co morbidities - long discussion about weight loss, diet, and exercise  Chronic alcohol abuse Going twice a week to AA  B12 deficiency - B12 is low end of normal, add sublingual B12.  -     Iron and TIBC -     Ferritin -     Vitamin B12  Vitamin D deficiency -     VITAMIN D 25 Hydroxy (Vit-D Deficiency, Fractures)  Medication management -     Magnesium  Other abnormal glucose Discussed general issues about diabetes pathophysiology and management., Educational material distributed., Suggested low cholesterol diet., Encouraged aerobic exercise., Discussed foot care., Reminded to get yearly retinal exam. -     Hemoglobin A1c  Dyspnea, unspecified type -     D-dimer, quantitative (not at [facility]) - CXR - Check labs including anemia, etc - weight loss encouraged - if any CP, worsening SOB, nausea, dizziness, sweating with exertion go to ER  Continue diet and meds as discussed. Further disposition pending results of labs.  HPI 51 y.o. female  presents for 3 month follow up with hypertension, hyperlipidemia, prediabetes and vitamin D.  States she had 4 days during the snow of vomiting, diarrhea and had hallucinations at that time as well, has alcoholic history but has not been drinking. She has SOB  with exercise and has had fatigue as well, denies any CP, dizziness, sweating, or nausea with exertion, no cough or wheezing, states some weakness in her legs. Following with Dr. Cardell Peach for RA.    Her blood pressure has been controlled at home, today their BP is BP: 118/80.   She does workout. She denies chest pain, shortness of breath, dizziness.   She is not on cholesterol medication and denies myalgias. Her cholesterol is at goal. The cholesterol last visit was:   Lab Results  Component Value Date   CHOL 218 (H) 03/28/2016   HDL 75 03/28/2016   LDLCALC 126 03/28/2016   TRIG 85 03/28/2016   CHOLHDL 2.9 03/28/2016     She has been working on diet and exercise for prediabetes, and denies foot ulcerations, hyperglycemia, hypoglycemia , increased appetite, nausea, paresthesia of the feet, polydipsia, polyuria, visual disturbances, vomiting and weight loss. Last A1C in the office was:  Lab Results  Component Value Date   HGBA1C 5.1 03/28/2016    Patient is on Vitamin D supplement.  Lab Results  Component Value Date   VD25OH 39 12/26/2014     She reports that she is seeing a doctor at [facility] for her neck.   BMI is Body mass index is 33.8 kg/m., she is working on  diet and exercise. Wt Readings from Last 3 Encounters:  06/29/16 209 lb 6.4 oz (95 kg)  03/28/16 209 lb (94.8 kg)  11/16/15 205 lb (93 kg)       Current Medications:  Current Outpatient Prescriptions on File Prior to Visit  Medication Sig Dispense Refill  . acyclovir (ZOVIRAX) 400 MG tablet TAKE 1 TABLET (400 MG TOTAL) BY MOUTH 2 (TWO) TIMES DAILY. 180 tablet 0  . ALPRAZolam (XANAX) 0.25 MG tablet TAKE 1/2-1 TABLET BY MOUTH 2-3 TIMES AS NEEDED 90 tablet 0  . amLODipine (NORVASC) 10 MG tablet Take 1 tablet (10 mg total) by mouth daily. 90 tablet 0  . atenolol (TENORMIN) 100 MG tablet TAKE 1 TABLET BY MOUTH TWICE DAILY 180 tablet 0  . buPROPion (WELLBUTRIN XL) 150 MG 24 hr tablet Take 1 tablet (150 mg total) by mouth  every morning. Medication cannot be mixed with alcohol 90 tablet 2  . citalopram (CELEXA) 40 MG tablet Take 1 tablet (40 mg total) by mouth daily. 90 tablet 1  . hydroxychloroquine (PLAQUENIL) 200 MG tablet TAKE 1 TABLET (200 MG TOTAL) BY MOUTH 2 (TWO) TIMES DAILY. 60 tablet 1  . hydroxypropyl methylcellulose / hypromellose (ISOPTO TEARS / GONIOVISC) 2.5 % ophthalmic solution Place 1 drop into both eyes 3 (three) times daily as needed for dry eyes.    Marland Kitchen losartan (COZAAR) 100 MG tablet TAKE 1 TABLET (100 MG TOTAL) BY MOUTH DAILY. 90 tablet 1  . metaxalone (SKELAXIN) 800 MG tablet Take 800 mg by mouth 3 (three) times daily.    . ondansetron (ZOFRAN ODT) 8 MG disintegrating tablet 8mg  ODT q4 hours prn nausea 30 tablet 0  . pantoprazole (PROTONIX) 40 MG tablet TAKE 1 TABLET BY MOUTH EVERY DAY 30 tablet 0  . promethazine (PHENERGAN) 25 MG tablet Take 25 mg by mouth every 6 (six) hours as needed.    . sucralfate (CARAFATE) 1 g tablet Take 1 tablet (1 g total) by mouth 4 (four) times daily -  with meals and at bedtime. 120 tablet 1   No current facility-administered medications on file prior to visit.     Medical History:  Past Medical History:  Diagnosis Date  . Alcoholism (HCC)   . Allergy   . Anxiety   . Depression   . Eye irritation    R eye- redness- from removing contact lense  . GERD (gastroesophageal reflux disease)   . Herpes simplex type II infection   . Hyperlipidemia   . Hypertension   . Leg pain, right   . Mental disorder   . Neuromuscular disorder (HCC)   . Obesity   . Rheumatoid arthritis involving multiple joints (HCC)    rheumatoid arthritis- hands, degeneration of lumbar  spine     Allergies:  Allergies  Allergen Reactions  . Codeine Itching     Review of Systems:  Review of Systems  Constitutional: Negative for chills, diaphoresis, fever, malaise/fatigue and weight loss.  HENT: Negative.  Negative for congestion, ear pain and sore throat.   Eyes: Negative.    Respiratory: Positive for shortness of breath. Negative for cough, hemoptysis, sputum production and wheezing.   Cardiovascular: Negative.  Negative for chest pain, palpitations, orthopnea, claudication, leg swelling and PND.  Gastrointestinal: Positive for heartburn. Negative for abdominal pain, blood in stool, constipation, diarrhea (resolved x 4 days) and melena.  Genitourinary: Negative.   Musculoskeletal: Positive for joint pain (from RA) and myalgias. Negative for back pain, falls and neck pain.  Skin: Negative.  Neurological: Negative.  Negative for dizziness, sensory change, loss of consciousness, weakness and headaches.  Endo/Heme/Allergies: Negative.   Psychiatric/Behavioral: Positive for hallucinations (x 4 days after N/V that resolved). Negative for depression, memory loss, substance abuse and suicidal ideas. The patient is not nervous/anxious and does not have insomnia.     Family history- Review and unchanged  Social history- Review and unchanged  Physical Exam: BP 118/80   Pulse 89   Temp 97.2 F (36.2 C)   Resp 16   Ht 5\' 6"  (1.676 m)   Wt 209 lb 6.4 oz (95 kg)   LMP 06/14/2012   SpO2 99%   BMI 33.80 kg/m  Wt Readings from Last 3 Encounters:  06/29/16 209 lb 6.4 oz (95 kg)  03/28/16 209 lb (94.8 kg)  11/16/15 205 lb (93 kg)    General Appearance: Well nourished well developed, in no apparent distress. Eyes: PERRLA, EOMs, conjunctiva no swelling or erythema ENT/Mouth: Ear canals normal without obstruction, swelling, erythma, discharge.  TMs normal bilaterally.  Oropharynx moist, clear, without exudate, or postoropharyngeal swelling. Neck: Supple, thyroid normal,no cervical adenopathy  Respiratory: Respiratory effort normal, Breath sounds clear A&P without rhonchi, wheeze, or rale.  No retractions, no accessory usage. Cardio: RRR with no MRGs. Brisk peripheral pulses without edema.  Abdomen: Soft, obese, + BS,  RUQ tenderness with hepatomegaly, no guarding,  rebound, hernias Musculoskeletal: Full ROM, 5/5 strength, Normal gait Skin: Warm, dry without rashes, lesions, ecchymosis.  Neuro: Awake and oriented X 3, Cranial nerves intact. Normal muscle tone, no cerebellar symptoms. Psych: Normal affect, Insight and Judgment appropriate.    11/18/15, PA-C 2:59 PM [facility]

## 2016-06-29 NOTE — Patient Instructions (Signed)
Shortness of Breath, Adult Shortness of breath is when a person has trouble breathing enough air, or when a person feels like she or he is having trouble breathing in enough air. Shortness of breath could be a sign of medical problem. Follow these instructions at home: Pay attention to any changes in your symptoms. Take these actions to help with your condition:  Do not smoke. Smoking is a common cause of shortness of breath. If you smoke and you need help quitting, ask your health care provider.  Avoid things that can irritate your airways, such as:  Mold.  Dust.  Air pollution.  Chemical fumes.  Things that can cause allergy symptoms (allergens), if you have allergies.  Keep your living space clean and free of mold and dust.  Rest as needed. Slowly return to your usual activities.  Take over-the-counter and prescription medicines, including oxygen and inhaled medicines, only as told by your health care provider.  Keep all follow-up visits as told by your health care provider. This is important. Contact a health care provider if:  Your condition does not improve as soon as expected.  You have a hard time doing your normal activities, even after you rest.  You have new symptoms. Get help right away if:  Your shortness of breath gets worse.  You have shortness of breath when you are resting.  You feel light-headed or you faint.  You have a cough that is not controlled with medicines.  You cough up blood.  You have pain with breathing.  You have pain in your chest, arms, shoulders, or abdomen.  You have a fever.  You cannot walk up stairs or exercise the way that you normally do. This information is not intended to replace advice given to you by your health care provider. Make sure you discuss any questions you have with your health care provider. Document Released: 02/08/2001 Document Revised: 12/05/2015 Document Reviewed: 10/22/2015 Elsevier Interactive Patient  Education  2017 [employer].   Simple math prevails.    1st - exercise does not produce significant weight loss - at best one converts fat into muscle , "bulks up", loses inches, but usually stays "weight neutral"     2nd - think of your body weightas a check book: If you eat more calories than you burn up - you save money or gain weight .... Or if you spend more money than you put in the check book, ie burn up more calories than you eat, then you lose weight     3rd - if you walk or run 1 mile, you burn up 100 calories - you have to burn up 3,500 calories to lose 1 pound, ie you have to walk/run 35 miles to lose 1 measly pound. So if you want to lose 10 #, then you have to walk/run 350 miles, so.... clearly exercise is not the solution.     4. So if you consume 1,500 calories, then you have to burn up the equivalent of 15 miles to stay weight neutral - It also stands to reason that if you consume 1,500 cal/day and don't lose weight, then you must be burning up about 1,500 cals/day to stay weight neutral.     5. If you really want to lose weight, you must cut your calorie intake 300 calories /day and at that rate you should lose about 1 # every 3 days.   6. Please purchase Dr Francis Dowse Fuhrman's book(s) "The End of Dieting" & "Eat to  Live" . It has some great concepts and recipes.     We want weight loss that will last so you should lose 1-2 pounds a week.  THAT IS IT! Please pick THREE things a month to change. Once it is a habit check off the item. Then pick another three items off the list to become habits.  If you are already doing a habit on the list GREAT!  Cross that item off! o Don't drink your calories. Ie, alcohol, soda, fruit juice, and sweet tea.  o Drink more water. Drink a glass when you feel hungry or before each meal.  o Eat breakfast - Complex carb and protein (likeDannon light and fit yogurt, oatmeal, fruit, eggs, [place] bacon). o Measure your cereal.  Eat no more than one cup a  day. (ie [place]) o Eat an apple a day. o Add a vegetable a day. o Try a new vegetable a month. o Use Pam! Stop using oil or butter to cook. o Don't finish your plate or use smaller plates. o Share your dessert. o Eat sugar free Jello for dessert or frozen grapes. o Don't eat 2-3 hours before bed. o Switch to whole wheat bread, pasta, and brown rice. o Make healthier choices when you eat out. No fries! o Pick baked chicken, NOT fried. o Don't forget to SLOW DOWN when you eat. It is not going anywhere.  o Take the stairs. o Park far away in the parking lot o [employer] (or weights) for 10 minutes while watching TV. o Walk at work for 10 minutes during break. o Walk outside 1 time a week with your friend, kids, dog, or significant other. o Start a walking group at church. o Walk the mall as much as you can tolerate.  o Keep a food diary. o Weigh yourself daily. o Walk for 15 minutes 3 days per week. o Cook at home more often and eat out less.  If life happens and you go back to old habits, it is okay.  Just start over. You can do it!   If you experience chest pain, get short of breath, or tired during the exercise, please stop immediately and inform your doctor.

## 2016-06-30 ENCOUNTER — Other Ambulatory Visit: Payer: Self-pay | Admitting: Physician Assistant

## 2016-06-30 ENCOUNTER — Encounter: Payer: Self-pay | Admitting: Internal Medicine

## 2016-06-30 ENCOUNTER — Encounter: Payer: Self-pay | Admitting: Physician Assistant

## 2016-06-30 DIAGNOSIS — K7682 Hepatic encephalopathy: Secondary | ICD-10-CM

## 2016-06-30 DIAGNOSIS — R0600 Dyspnea, unspecified: Secondary | ICD-10-CM

## 2016-06-30 DIAGNOSIS — K7290 Hepatic failure, unspecified without coma: Secondary | ICD-10-CM

## 2016-06-30 LAB — HEPATIC FUNCTION PANEL
ALT: 45 U/L — AB (ref 6–29)
AST: 38 U/L — AB (ref 10–35)
Albumin: 4.2 g/dL (ref 3.6–5.1)
Alkaline Phosphatase: 106 U/L (ref 33–130)
BILIRUBIN DIRECT: 0.1 mg/dL (ref <=0.2)
BILIRUBIN INDIRECT: 0.3 mg/dL (ref 0.2–1.2)
TOTAL PROTEIN: 7.2 g/dL (ref 6.1–8.1)
Total Bilirubin: 0.4 mg/dL (ref 0.2–1.2)

## 2016-06-30 LAB — MAGNESIUM: Magnesium: 1.5 mg/dL (ref 1.5–2.5)

## 2016-06-30 LAB — AMMONIA: Ammonia: 67 umol/L — ABNORMAL HIGH (ref <=47)

## 2016-06-30 LAB — IRON AND TIBC
%SAT: 30 % (ref 11–50)
IRON: 117 ug/dL (ref 45–160)
TIBC: 384 ug/dL (ref 250–450)
UIBC: 267 ug/dL (ref 125–400)

## 2016-06-30 LAB — LIPID PANEL
Cholesterol: 230 mg/dL — ABNORMAL HIGH (ref <200)
HDL: 46 mg/dL — ABNORMAL LOW (ref >50)
TRIGLYCERIDES: 402 mg/dL — AB (ref <150)
Total CHOL/HDL Ratio: 5 Ratio — ABNORMAL HIGH (ref <5.0)

## 2016-06-30 LAB — BASIC METABOLIC PANEL WITH GFR
BUN: 4 mg/dL — AB (ref 7–25)
CO2: 20 mmol/L (ref 20–31)
Calcium: 9.5 mg/dL (ref 8.6–10.4)
Chloride: 99 mmol/L (ref 98–110)
Creat: 0.71 mg/dL (ref 0.50–1.05)
GFR, Est African American: >89 mL/min (ref >=60)
GLUCOSE: 96 mg/dL (ref 65–99)
POTASSIUM: 4.2 mmol/L (ref 3.5–5.3)
Sodium: 135 mmol/L (ref 135–146)

## 2016-06-30 LAB — CK: CK TOTAL: 93 U/L (ref 7–177)

## 2016-06-30 LAB — GAMMA GT: GGT: 111 U/L — ABNORMAL HIGH (ref 7–51)

## 2016-06-30 LAB — D-DIMER, QUANTITATIVE (NOT AT ARMC): D DIMER QUANT: 0.62 ug{FEU}/mL — AB (ref <0.50)

## 2016-06-30 LAB — VITAMIN D 25 HYDROXY (VIT D DEFICIENCY, FRACTURES): Vit D, 25-Hydroxy: 38 ng/mL (ref 30–100)

## 2016-06-30 MED ORDER — LACTULOSE 10 GM/15ML PO SOLN
20.0000 g | Freq: Two times a day (BID) | ORAL | 0 refills | Status: DC
Start: 2016-06-30 — End: 2016-09-22

## 2016-07-05 LAB — VITAMIN B6: Vitamin B6: 18.3 ng/mL (ref 2.1–21.7)

## 2016-07-20 ENCOUNTER — Other Ambulatory Visit: Payer: Self-pay | Admitting: Internal Medicine

## 2016-07-20 DIAGNOSIS — K219 Gastro-esophageal reflux disease without esophagitis: Secondary | ICD-10-CM

## 2016-08-17 ENCOUNTER — Other Ambulatory Visit: Payer: Self-pay | Admitting: Internal Medicine

## 2016-08-17 NOTE — Telephone Encounter (Signed)
Xanax was called into pharmacy on 21st March 2018 @ 2:32pm by DD

## 2016-08-23 ENCOUNTER — Other Ambulatory Visit: Payer: Self-pay | Admitting: *Deleted

## 2016-08-23 MED ORDER — OMEPRAZOLE 20 MG PO CPDR: 20.0000 mg | DELAYED_RELEASE_CAPSULE | Freq: Every day | ORAL | 2 refills | Status: AC

## 2016-09-22 ENCOUNTER — Encounter: Payer: Self-pay | Admitting: Physician Assistant

## 2016-09-22 ENCOUNTER — Ambulatory Visit (INDEPENDENT_AMBULATORY_CARE_PROVIDER_SITE_OTHER): Payer: PRIVATE HEALTH INSURANCE | Admitting: Physician Assistant

## 2016-09-22 VITALS — BP 150/120 | HR 78 | Temp 97.2°F | Resp 16 | Ht 66.0 in | Wt 216.4 lb

## 2016-09-22 DIAGNOSIS — M542 Cervicalgia: Secondary | ICD-10-CM | POA: Diagnosis not present

## 2016-09-22 DIAGNOSIS — I10 Essential (primary) hypertension: Secondary | ICD-10-CM

## 2016-09-22 MED ORDER — DICLOFENAC SODIUM 75 MG PO TBEC: 75.0000 mg | DELAYED_RELEASE_TABLET | Freq: Two times a day (BID) | ORAL | 2 refills | Status: DC

## 2016-09-22 MED ORDER — BACLOFEN 10 MG PO TABS: ORAL_TABLET | ORAL | 1 refills | Status: AC

## 2016-09-22 NOTE — Patient Instructions (Addendum)
Go home take 1/2 of atenolol when you get home and start to take 1.5 atenolol daily at the same time and continue the other medications the same.  Monitor BP at home   Do heating pad Light stretching Take baclofen as needed Diclofenac WITH FOOD If not better or if worse can follow up with ortho   Cervical Sprain A cervical sprain is a stretch or tear in one or more of the tough, cord-like tissues that connect bones (ligaments) in the neck. Cervical sprains can range from mild to severe. Severe cervical sprains can cause the spinal bones (vertebrae) in the neck to be unstable. This can lead to spinal cord damage and can result in serious nervous system problems. The amount of time that it takes for a cervical sprain to get better depends on the cause and extent of the injury. Most cervical sprains heal in 4-6 weeks. What are the causes? Cervical sprains may be caused by an injury (trauma), such as from a motor vehicle accident, a fall, or sudden forward and backward whipping movement of the head and neck (whiplash injury). Mild cervical sprains may be caused by wear and tear over time, such as from poor posture, sitting in a chair that does not provide support, or looking up or down for long periods of time. What increases the risk? The following factors may make you more likely to develop this condition:  Participating in activities that have a high risk of trauma to the neck. These include contact sports, auto racing, gymnastics, and diving.  Taking risks when driving or riding in a motor vehicle, such as speeding.  Having osteoarthritis of the spine.  Having poor strength and flexibility of the neck.  A previous neck injury.  Having poor posture.  Spending a lot of time in certain positions that put stress on the neck, such as sitting at a computer for long periods of time. What are the signs or symptoms? Symptoms of this condition include:  Pain, soreness, stiffness,  tenderness, swelling, or a burning sensation in the front, back, or sides of the neck.  Sudden tightening of neck muscles that you cannot control (muscle spasms).  Pain in the shoulders or upper back.  Limited ability to move the neck.  Headache.  Dizziness.  Nausea.  Vomiting.  Weakness, numbness, or tingling in a hand or an arm. Symptoms may develop right away after injury, or they may develop over a few days. In some cases, symptoms may go away with treatment and return (recur) over time. How is this diagnosed? This condition may be diagnosed based on:  Your medical history.  Your symptoms.  Any recent injuries or known neck problems that you have, such as arthritis in the neck.  A physical exam.  Imaging tests, such as:  X-rays.  MRI.  CT scan. How is this treated? This condition is treated by resting and icing the injured area and doing physical therapy exercises. Depending on the severity of your condition, treatment may also include:  Keeping your neck in place (immobilized) for periods of time. This may be done using:  A cervical collar. This supports your chin and the back of your head.  A cervical traction device. This is a sling that holds up your head. This removes weight and pressure from your neck, and it may help to relieve pain.  Medicines that help to relieve pain and inflammation.  Medicines that help to relax your muscles (muscle relaxants).  Surgery. This is rare.  Follow these instructions at home: If you have a cervical collar:   Wear it as told by your health care provider. Do not remove the collar unless instructed by your health care provider.  Ask your health care provider before you make any adjustments to your collar.  If you have long hair, keep it outside of the collar.  Ask your health care provider if you can remove the collar for cleaning and bathing. If you are allowed to remove the collar for cleaning or bathing:  Follow  instructions from your health care provider about how to remove the collar safely.  Clean the collar by wiping it with mild soap and water and drying it completely.  If your collar has removable pads, remove them every 1-2 days and wash them by hand with soap and water. Let them air-dry completely before you put them back in the collar.  Check your skin under the collar for irritation or sores. If you see any, tell your health care provider. Managing pain, stiffness, and swelling   If directed, use a cervical traction device as told by your health care provider.  If directed, apply heat to the affected area before you do your physical therapy or as often as told by your health care provider. Use the heat source that your health care provider recommends, such as a moist heat pack or a heating pad.  Place a towel between your skin and the heat source.  Leave the heat on for 20-30 minutes.  Remove the heat if your skin turns bright red. This is especially important if you are unable to feel pain, heat, or cold. You may have a greater risk of getting burned.  If directed, put ice on the affected area:  Put ice in a plastic bag.  Place a towel between your skin and the bag.  Leave the ice on for 20 minutes, 2-3 times a day. Activity   Do not drive while wearing a cervical collar. If you do not have a cervical collar, ask your health care provider if it is safe to drive while your neck heals.  Do not drive or use heavy machinery while taking prescription pain medicine or muscle relaxants, unless your health care provider approves.  Do not lift anything that is heavier than 10 lb (4.5 kg) until your health care provider tells you that it is safe.  Rest as directed by your health care provider. Avoid positions and activities that make your symptoms worse. Ask your health care provider what activities are safe for you.  If physical therapy was prescribed, do exercises as told by your  health care provider or physical therapist. General instructions   Take over-the-counter and prescription medicines only as told by your health care provider.  Do not use any products that contain nicotine or tobacco, such as cigarettes and e-cigarettes. These can delay healing. If you need help quitting, ask your health care provider.  Keep all follow-up visits as told by your health care provider or physical therapist. This is important. How is this prevented? To prevent a cervical sprain from happening again:  Use and maintain good posture. Make any needed adjustments to your workstation to help you use good posture.  Exercise regularly as directed by your health care provider or physical therapist.  Avoid risky activities that may cause a cervical sprain. Contact a health care provider if:  You have symptoms that get worse or do not get better after 2 weeks of treatment.  You have pain that gets worse or does not get better with medicine.  You develop new, unexplained symptoms.  You have sores or irritated skin on your neck from wearing your cervical collar. Get help right away if:  You have severe pain.  You develop numbness, tingling, or weakness in any part of your body.  You cannot move a part of your body (you have paralysis).  You have neck pain along with:  Severe dizziness.  Headache. Summary  A cervical sprain is a stretch or tear in one or more of the tough, cord-like tissues that connect bones (ligaments) in the neck.  Cervical sprains may be caused by an injury (trauma), such as from a motor vehicle accident, a fall, or sudden forward and backward whipping movement of the head and neck (whiplash injury).  Symptoms may develop right away after injury, or they may develop over a few days.  This condition is treated by resting and icing the injured area and doing physical therapy exercises. This information is not intended to replace advice given to you by  your health care provider. Make sure you discuss any questions you have with your health care provider. Document Released: 03/13/2007 Document Revised: 01/13/2016 Document Reviewed: 01/13/2016 Elsevier Interactive Patient Education  2017 [employer].
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
## 2016-09-26 ENCOUNTER — Other Ambulatory Visit: Payer: Self-pay | Admitting: Internal Medicine

## 2016-09-26 NOTE — Progress Notes (Deleted)
Assessment and Plan:  Essential hypertension - continue medications, DASH diet, exercise and monitor at home. Call if greater than 130/80.  -     CBC with Differential/Platelet -     BASIC METABOLIC PANEL WITH GFR -     TSH -     Hepatic function panel  Hepatic steatosis Continue weight loss -     CK -     Hepatic function panel -     Vitamin B6  Hepatic encephalopathy (HCC) ? Another episode with recent hallucinations versus dehydration, check labs -     CK -     AMMONIA -     Gamma GT -     Hepatic function panel -     Vitamin B6  Mixed hyperlipidemia -continue medications, check lipids, decrease fatty foods, increase activity.  -     Lipid panel  Morbid Obesity with co morbidities - long discussion about weight loss, diet, and exercise  Chronic alcohol abuse Going twice a week to [facility]  B12 deficiency - B12 is low end of normal, add sublingual B12.  -     Iron and TIBC -     Ferritin -     Vitamin B12  Vitamin D deficiency -     VITAMIN D 25 Hydroxy (Vit-D Deficiency, Fractures)  Medication management -     Magnesium  Other abnormal glucose Discussed general issues about diabetes pathophysiology and management., Educational material distributed., Suggested low cholesterol diet., Encouraged aerobic exercise., Discussed foot care., Reminded to get yearly retinal exam. -     Hemoglobin A1c  Dyspnea, unspecified type -     D-dimer, quantitative (not at [facility]) - CXR - Check labs including anemia, etc - weight loss encouraged - if any CP, worsening SOB, nausea, dizziness, sweating with exertion go to ER  Continue diet and meds as discussed. Further disposition pending results of labs.  HPI 51 y.o. female  presents for 3 month follow up with hypertension, hyperlipidemia, prediabetes and vitamin D.  States she had 4 days during the snow of vomiting, diarrhea and had hallucinations at that time as well, has alcoholic history but has not been drinking. She has SOB  with exercise and has had fatigue as well, denies any CP, dizziness, sweating, or nausea with exertion, no cough or wheezing, states some weakness in her legs. Following with Dr. Cardell Peach for RA.    Her blood pressure has been controlled at home, today their BP is  .   She does workout. She denies chest pain, shortness of breath, dizziness.   She is not on cholesterol medication and denies myalgias. Her cholesterol is at goal. The cholesterol last visit was:   Lab Results  Component Value Date   CHOL 230 (H) 06/29/2016   HDL 46 (L) 06/29/2016   LDLCALC NOT CALC 06/29/2016   TRIG 402 (H) 06/29/2016   CHOLHDL 5.0 (H) 06/29/2016     She has been working on diet and exercise for prediabetes, and denies foot ulcerations, hyperglycemia, hypoglycemia , increased appetite, nausea, paresthesia of the feet, polydipsia, polyuria, visual disturbances, vomiting and weight loss. Last A1C in the office was:  Lab Results  Component Value Date   HGBA1C 5.3 06/29/2016    Patient is on Vitamin D supplement.  Lab Results  Component Value Date   VD25OH 70 06/29/2016     She reports that she is seeing a doctor at [facility] for her neck.   BMI is There is no height or weight  on file to calculate BMI., she is working on diet and exercise. Wt Readings from Last 3 Encounters:  09/22/16 216 lb 6.4 oz (98.2 kg)  06/29/16 209 lb 6.4 oz (95 kg)  03/28/16 209 lb (94.8 kg)       Current Medications:  Current Outpatient Prescriptions on File Prior to Visit  Medication Sig Dispense Refill  . acyclovir (ZOVIRAX) 400 MG tablet TAKE 1 TABLET (400 MG TOTAL) BY MOUTH 2 (TWO) TIMES DAILY. 180 tablet 0  . ALPRAZolam (XANAX) 0.25 MG tablet TAKE 1/2-1 TABLET BY MOUTH 2-3 TIMES AS NEEDED 90 tablet 0  . amLODipine (NORVASC) 10 MG tablet Take 1 tablet (10 mg total) by mouth daily. 90 tablet 0  . atenolol (TENORMIN) 100 MG tablet TAKE 1 TABLET BY MOUTH TWICE DAILY 180 tablet 0  . baclofen (LIORESAL) 10 MG tablet 1/2-1  tablet up to 3 x a day as needed for neck pain 30 tablet 1  . buPROPion (WELLBUTRIN XL) 150 MG 24 hr tablet Take 1 tablet (150 mg total) by mouth every morning. Medication cannot be mixed with alcohol 90 tablet 2  . citalopram (CELEXA) 40 MG tablet Take 1 tablet (40 mg total) by mouth daily. 90 tablet 1  . diclofenac (VOLTAREN) 75 MG EC tablet Take 1 tablet (75 mg total) by mouth 2 (two) times daily. 60 tablet 2  . hydroxychloroquine (PLAQUENIL) 200 MG tablet TAKE 1 TABLET (200 MG TOTAL) BY MOUTH 2 (TWO) TIMES DAILY. 60 tablet 1  . hydroxypropyl methylcellulose / hypromellose (ISOPTO TEARS / GONIOVISC) 2.5 % ophthalmic solution Place 1 drop into both eyes 3 (three) times daily as needed for dry eyes.    Marland Kitchen losartan (COZAAR) 100 MG tablet TAKE 1 TABLET (100 MG TOTAL) BY MOUTH DAILY. 90 tablet 1  . omeprazole (PRILOSEC) 20 MG capsule Take 1 capsule (20 mg total) by mouth daily. 90 capsule 2   No current facility-administered medications on file prior to visit.     Medical History:  Past Medical History:  Diagnosis Date  . Alcoholism (HCC)   . Allergy   . Anxiety   . Depression   . Eye irritation    R eye- redness- from removing contact lense  . GERD (gastroesophageal reflux disease)   . Herpes simplex type II infection   . Hyperlipidemia   . Hypertension   . Leg pain, right   . Mental disorder   . Neuromuscular disorder (HCC)   . Obesity   . Rheumatoid arthritis involving multiple joints (HCC)    rheumatoid arthritis- hands, degeneration of lumbar  spine     Allergies:  Allergies  Allergen Reactions  . Codeine Itching     Review of Systems:  Review of Systems  Constitutional: Negative for chills, diaphoresis, fever, malaise/fatigue and weight loss.  HENT: Negative.  Negative for congestion, ear pain and sore throat.   Eyes: Negative.   Respiratory: Positive for shortness of breath. Negative for cough, hemoptysis, sputum production and wheezing.   Cardiovascular: Negative.   Negative for chest pain, palpitations, orthopnea, claudication, leg swelling and PND.  Gastrointestinal: Positive for heartburn. Negative for abdominal pain, blood in stool, constipation, diarrhea (resolved x 4 days) and melena.  Genitourinary: Negative.   Musculoskeletal: Positive for joint pain (from RA) and myalgias. Negative for back pain, falls and neck pain.  Skin: Negative.   Neurological: Negative.  Negative for dizziness, sensory change, loss of consciousness, weakness and headaches.  Endo/Heme/Allergies: Negative.   Psychiatric/Behavioral: Positive for hallucinations (x 4  days after N/V that resolved). Negative for depression, memory loss, substance abuse and suicidal ideas. The patient is not nervous/anxious and does not have insomnia.     Family history- Review and unchanged  Social history- Review and unchanged  Physical Exam: LMP 06/14/2012  Wt Readings from Last 3 Encounters:  09/22/16 216 lb 6.4 oz (98.2 kg)  06/29/16 209 lb 6.4 oz (95 kg)  03/28/16 209 lb (94.8 kg)    General Appearance: Well nourished well developed, in no apparent distress. Eyes: PERRLA, EOMs, conjunctiva no swelling or erythema ENT/Mouth: Ear canals normal without obstruction, swelling, erythma, discharge.  TMs normal bilaterally.  Oropharynx moist, clear, without exudate, or postoropharyngeal swelling. Neck: Supple, thyroid normal,no cervical adenopathy  Respiratory: Respiratory effort normal, Breath sounds clear A&P without rhonchi, wheeze, or rale.  No retractions, no accessory usage. Cardio: RRR with no MRGs. Brisk peripheral pulses without edema.  Abdomen: Soft, obese, + BS,  RUQ tenderness with hepatomegaly, no guarding, rebound, hernias Musculoskeletal: Full ROM, 5/5 strength, Normal gait Skin: Warm, dry without rashes, lesions, ecchymosis.  Neuro: Awake and oriented X 3, Cranial nerves intact. Normal muscle tone, no cerebellar symptoms. Psych: Normal affect, Insight and Judgment  appropriate.    Quentin Mulling, PA-C 7:29 AM [facility]

## 2016-09-27 ENCOUNTER — Ambulatory Visit: Payer: Self-pay | Admitting: Physician Assistant

## 2016-10-04 ENCOUNTER — Other Ambulatory Visit: Payer: Self-pay | Admitting: Physician Assistant

## 2016-10-04 NOTE — Telephone Encounter (Signed)
Xanax was called into pharmacy on 8th May 2018 @ 1:52pm by DD

## 2016-10-14 NOTE — Progress Notes (Deleted)
Assessment and Plan:  Essential hypertension - continue medications, DASH diet, exercise and monitor at home. Call if greater than 130/80.  -     CBC with Differential/Platelet -     BASIC METABOLIC PANEL WITH GFR -     TSH -     Hepatic function panel  Hepatic steatosis Continue weight loss, has stopped drinking  Hepatic encephalopathy (HCC) - monitor  Mixed hyperlipidemia -continue medications, check lipids, decrease fatty foods, increase activity.  -     Lipid panel  Morbid Obesity with co morbidities - long discussion about weight loss, diet, and exercise  Chronic alcohol abuse Going twice a week to AA  B12 deficiency - B12 is low end of normal, add sublingual B12.  -     Iron and TIBC -     Ferritin -     Vitamin B12  Vitamin D deficiency -     VITAMIN D 25 Hydroxy (Vit-D Deficiency, Fractures)  Medication management -     Magnesium  Other abnormal glucose Discussed general issues about diabetes pathophysiology and management., Educational material distributed., Suggested low cholesterol diet., Encouraged aerobic exercise., Discussed foot care., Reminded to get yearly retinal exam. -     Hemoglobin A1c  Continue diet and meds as discussed. Further disposition pending results of labs.  HPI 51 y.o. female  presents for 3 month follow up with hypertension, hyperlipidemia, prediabetes and vitamin D.  Patient has history of alcohol abuse with liver fibrosis and elevated ammonia levels, going to AA and states has been 1 year sober in March. She is following with GI.  She also has RA and follows with Dr. Cardell Peach.  Lab Results  Component Value Date   ALT 45 (H) 06/29/2016   AST 38 (H) 06/29/2016   ALKPHOS 106 06/29/2016   BILITOT 0.4 06/29/2016    Her blood pressure has been controlled at home, today their BP is  .   She does workout. She denies chest pain, shortness of breath, dizziness.   She is not on cholesterol medication and denies myalgias. Her cholesterol is  at goal. The cholesterol last visit was:   Lab Results  Component Value Date   CHOL 230 (H) 06/29/2016   HDL 46 (L) 06/29/2016   LDLCALC NOT CALC 06/29/2016   TRIG 402 (H) 06/29/2016   CHOLHDL 5.0 (H) 06/29/2016    She has been working on diet and exercise for prediabetes, and denies foot ulcerations, hyperglycemia, hypoglycemia , increased appetite, nausea, paresthesia of the feet, polydipsia, polyuria, visual disturbances, vomiting and weight loss. Last A1C in the office was:  Lab Results  Component Value Date   HGBA1C 5.3 06/29/2016   Patient is on Vitamin D supplement.  Lab Results  Component Value Date   VD25OH 38 06/29/2016     BMI is There is no height or weight on file to calculate BMI., she is working on diet and exercise. Wt Readings from Last 3 Encounters:  09/22/16 216 lb 6.4 oz (98.2 kg)  06/29/16 209 lb 6.4 oz (95 kg)  03/28/16 209 lb (94.8 kg)       Current Medications:  Current Outpatient Prescriptions on File Prior to Visit  Medication Sig Dispense Refill  . acyclovir (ZOVIRAX) 400 MG tablet TAKE 1 TABLET (400 MG TOTAL) BY MOUTH 2 (TWO) TIMES DAILY. 180 tablet 0  . ALPRAZolam (XANAX) 0.25 MG tablet TAKE 1/2 TO 1 TABLET BY MOUTH 2 TO 3 TIMES DAILY AS NEEDED 90 tablet 0  . amLODipine (  NORVASC) 10 MG tablet Take 1 tablet (10 mg total) by mouth daily. 90 tablet 0  . atenolol (TENORMIN) 100 MG tablet TAKE 1 TABLET BY MOUTH TWICE DAILY 180 tablet 1  . baclofen (LIORESAL) 10 MG tablet 1/2-1 tablet up to 3 x a day as needed for neck pain 30 tablet 1  . buPROPion (WELLBUTRIN XL) 150 MG 24 hr tablet Take 1 tablet (150 mg total) by mouth every morning. Medication cannot be mixed with alcohol 90 tablet 2  . citalopram (CELEXA) 40 MG tablet Take 1 tablet (40 mg total) by mouth daily. 90 tablet 1  . diclofenac (VOLTAREN) 75 MG EC tablet Take 1 tablet (75 mg total) by mouth 2 (two) times daily. 60 tablet 2  . hydroxychloroquine (PLAQUENIL) 200 MG tablet TAKE 1 TABLET (200 MG  TOTAL) BY MOUTH 2 (TWO) TIMES DAILY. 180 tablet 1  . hydroxypropyl methylcellulose / hypromellose (ISOPTO TEARS / GONIOVISC) 2.5 % ophthalmic solution Place 1 drop into both eyes 3 (three) times daily as needed for dry eyes.    Marland Kitchen losartan (COZAAR) 100 MG tablet TAKE 1 TABLET (100 MG TOTAL) BY MOUTH DAILY. 90 tablet 1  . omeprazole (PRILOSEC) 20 MG capsule Take 1 capsule (20 mg total) by mouth daily. 90 capsule 2   No current facility-administered medications on file prior to visit.     Medical History:  Past Medical History:  Diagnosis Date  . Alcoholism (HCC)   . Allergy   . Anxiety   . Depression   . Eye irritation    R eye- redness- from removing contact lense  . GERD (gastroesophageal reflux disease)   . Herpes simplex type II infection   . Hyperlipidemia   . Hypertension   . Leg pain, right   . Mental disorder   . Neuromuscular disorder (HCC)   . Obesity   . Rheumatoid arthritis involving multiple joints (HCC)    rheumatoid arthritis- hands, degeneration of lumbar  spine     Allergies:  Allergies  Allergen Reactions  . Codeine Itching     Review of Systems:  Review of Systems  Constitutional: Negative for chills, diaphoresis, fever, malaise/fatigue and weight loss.  HENT: Negative.  Negative for congestion, ear pain and sore throat.   Eyes: Negative.   Respiratory: Negative for cough, hemoptysis, sputum production, shortness of breath and wheezing.   Cardiovascular: Negative.  Negative for chest pain, palpitations, orthopnea, claudication, leg swelling and PND.  Gastrointestinal: Negative for abdominal pain, blood in stool, constipation, diarrhea (resolved x 4 days), heartburn and melena.  Genitourinary: Negative.   Musculoskeletal: Positive for joint pain (from RA) and myalgias. Negative for back pain, falls and neck pain.  Skin: Negative.   Neurological: Negative.  Negative for dizziness, sensory change, loss of consciousness, weakness and headaches.   Endo/Heme/Allergies: Negative.   Psychiatric/Behavioral: Negative for depression, hallucinations, memory loss, substance abuse and suicidal ideas. The patient is not nervous/anxious and does not have insomnia.     Family history- Review and unchanged  Social history- Review and unchanged  Physical Exam: LMP 06/14/2012  Wt Readings from Last 3 Encounters:  09/22/16 216 lb 6.4 oz (98.2 kg)  06/29/16 209 lb 6.4 oz (95 kg)  03/28/16 209 lb (94.8 kg)    General Appearance: Well nourished well developed, in no apparent distress. Eyes: PERRLA, EOMs, conjunctiva no swelling or erythema ENT/Mouth: Ear canals normal without obstruction, swelling, erythma, discharge.  TMs normal bilaterally.  Oropharynx moist, clear, without exudate, or postoropharyngeal swelling. Neck: Supple,  thyroid normal,no cervical adenopathy  Respiratory: Respiratory effort normal, Breath sounds clear A&P without rhonchi, wheeze, or rale.  No retractions, no accessory usage. Cardio: RRR with no MRGs. Brisk peripheral pulses without edema.  Abdomen: Soft, obese, + BS,  RUQ tenderness with hepatomegaly, no guarding, rebound, hernias Musculoskeletal: Full ROM, 5/5 strength, Normal gait Skin: Warm, dry without rashes, lesions, ecchymosis.  Neuro: Awake and oriented X 3, Cranial nerves intact. Normal muscle tone, no cerebellar symptoms. Psych: Normal affect, Insight and Judgment appropriate.    Quentin Mulling, PA-C 8:38 AM [facility]

## 2016-10-17 ENCOUNTER — Ambulatory Visit: Payer: Self-pay | Admitting: Physician Assistant

## 2016-10-25 ENCOUNTER — Other Ambulatory Visit: Payer: Self-pay | Admitting: Physician Assistant

## 2016-10-25 DIAGNOSIS — I10 Essential (primary) hypertension: Secondary | ICD-10-CM

## 2016-10-25 MED ORDER — LOSARTAN POTASSIUM 100 MG PO TABS: ORAL_TABLET | ORAL | 0 refills | Status: DC

## 2016-11-07 ENCOUNTER — Other Ambulatory Visit: Payer: Self-pay | Admitting: *Deleted

## 2016-11-07 MED ORDER — CITALOPRAM HYDROBROMIDE 40 MG PO TABS: 40.0000 mg | ORAL_TABLET | Freq: Every day | ORAL | 0 refills | Status: DC

## 2016-11-07 MED ORDER — BUPROPION HCL ER (XL) 150 MG PO TB24: 150.0000 mg | ORAL_TABLET | ORAL | 0 refills | Status: DC

## 2016-11-16 ENCOUNTER — Other Ambulatory Visit: Payer: Self-pay

## 2016-11-16 DIAGNOSIS — I10 Essential (primary) hypertension: Secondary | ICD-10-CM

## 2016-11-16 MED ORDER — AMLODIPINE BESYLATE 10 MG PO TABS: 10.0000 mg | ORAL_TABLET | Freq: Every day | ORAL | 0 refills | Status: DC

## 2016-11-17 ENCOUNTER — Other Ambulatory Visit: Payer: Self-pay

## 2016-11-17 DIAGNOSIS — I10 Essential (primary) hypertension: Secondary | ICD-10-CM

## 2016-11-17 MED ORDER — AMLODIPINE BESYLATE 10 MG PO TABS: 10.0000 mg | ORAL_TABLET | Freq: Every day | ORAL | 0 refills | Status: DC

## 2016-12-01 ENCOUNTER — Ambulatory Visit: Payer: Self-pay | Admitting: Internal Medicine

## 2016-12-19 ENCOUNTER — Ambulatory Visit: Payer: Self-pay | Admitting: Internal Medicine

## 2016-12-20 ENCOUNTER — Other Ambulatory Visit: Payer: Self-pay | Admitting: Internal Medicine

## 2016-12-20 DIAGNOSIS — I10 Essential (primary) hypertension: Secondary | ICD-10-CM

## 2016-12-20 MED ORDER — TELMISARTAN 80 MG PO TABS: ORAL_TABLET | ORAL | 0 refills | Status: DC

## 2016-12-21 ENCOUNTER — Other Ambulatory Visit: Payer: Self-pay | Admitting: *Deleted

## 2016-12-21 DIAGNOSIS — I10 Essential (primary) hypertension: Secondary | ICD-10-CM

## 2016-12-21 MED ORDER — TELMISARTAN 80 MG PO TABS: ORAL_TABLET | ORAL | 0 refills | Status: DC

## 2017-01-04 ENCOUNTER — Other Ambulatory Visit: Payer: Self-pay | Admitting: Internal Medicine

## 2017-01-10 ENCOUNTER — Other Ambulatory Visit: Payer: Self-pay | Admitting: Physician Assistant

## 2017-01-11 ENCOUNTER — Ambulatory Visit: Payer: Self-pay | Admitting: Internal Medicine

## 2017-01-11 ENCOUNTER — Other Ambulatory Visit: Payer: Self-pay | Admitting: Internal Medicine

## 2017-01-13 ENCOUNTER — Telehealth: Payer: Self-pay | Admitting: *Deleted

## 2017-01-13 NOTE — Telephone Encounter (Signed)
Patient called and requested a refill of her Xanax.  Per Dr Oneta Rack, the patient can have no refills until she has an OV, due to her frequent cancellations of appointments. Patient is aware.

## 2017-01-30 NOTE — Progress Notes (Signed)
This very nice 51 y.o. MWF presents for 3 month follow up with Hypertension, Hyperlipidemia, Pre-Diabetes and Vitamin D Deficiency. 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 - Dr Redmond Pulling for her Rheumatoid Arthritis. Patient reports she's now seeing a GI/Liver specialist in [place] who has assured her that she does not have serious or significant liver issues. In fact she relates that she recently had extensive blood test at Her Rheumatology office and declined having any labs today. Patient alleges Alcohol Abstinence since 13 Aug 2015 and also alleges attending AA mtg's 3 x/ week in [place].     She does report ongoing issues with chronic & acute anxiety and she has only taken her Alprazolam 0.25 mg very sparingly. She does endorse a desire to increase doses of her Celexa/Wellbutrin.      Patient is treated for HTN (1995) & BP has been controlled at home. Today's BP is at goal - 118/82. Patient has had no complaints of any cardiac type chest pain, palpitations, dyspnea/orthopnea/PND, dizziness, claudication, or dependent edema.     Hyperlipidemia is controlled with diet & meds. Patient denies myalgias or other med SE's. Last Lipids were not at goal: Lab Results  Component Value Date   CHOL 230 (H) 06/29/2016   HDL 46 (L) 06/29/2016   LDLCALC NOT CALC 06/29/2016   TRIG 402 (H) 06/29/2016   CHOLHDL 5.0 (H) 06/29/2016      Also, the patient has history of Morbid Obesity (BMI 34.8 ) and  Insulin Resistance/PreDiabetes with A1c 5.3% and elevated Insulin 194 in Jan 2015 and has had no symptoms of reactive hypoglycemia, diabetic polys, paresthesias or visual blurring.  Last A1c was at goal: Lab Results  Component Value Date   HGBA1C 5.3 06/29/2016      Further, the patient also has history of Vitamin D Deficiency ("35" in Dec 2015) and supplements vitamin D without any suspected side-effects. Last vitamin D was  still low:  Lab Results  Component Value Date   VD25OH 38 06/29/2016   Current Outpatient Prescriptions on File Prior to Visit  Medication Sig  . ALPRAZolam (XANAX) 0.25 MG tablet TAKE 1/2 TO 1 TABLET BY MOUTH 2 TO 3 TIMES DAILY AS NEEDED  . amLODipine (NORVASC) 10 MG tablet Take 1 tablet (10 mg total) by mouth daily.  Marland Kitchen atenolol (TENORMIN) 100 MG tablet TAKE 1 TABLET BY MOUTH TWICE DAILY  . baclofen (LIORESAL) 10 MG tablet 1/2-1 tablet up to 3 x a day as needed for neck pain  . buPROPion (WELLBUTRIN XL) 150 MG 24 hr tablet Take 1 tablet (150 mg total) by mouth every morning. Medication cannot be mixed with alcohol  . citalopram (CELEXA) 40 MG tablet TAKE 1 TABLET BY MOUTH EVERY DAY *NEEDS APPT*  . diclofenac (VOLTAREN) 75 MG EC tablet Take 1 tablet (75 mg total) by mouth 2 (two) times daily.  . hydroxychloroquine (PLAQUENIL) 200 MG tablet TAKE 1 TABLET (200 MG TOTAL) BY MOUTH 2 (TWO) TIMES DAILY.  . hydroxypropyl methylcellulose / hypromellose (ISOPTO TEARS / GONIOVISC) 2.5 % ophthalmic solution Place 1 drop into both eyes 3 (three) times daily as needed for dry eyes.  Marland Kitchen omeprazole (PRILOSEC) 20 MG capsule Take 1 capsule (20 mg total) by mouth daily.  Marland Kitchen telmisartan (MICARDIS) 80 MG tablet Take 1 tablet daily for BP   No current facility-administered medications on file prior to visit.  Allergies  Allergen Reactions  . Codeine Itching   PMHx:   Past Medical History:  Diagnosis Date  . Alcoholism (HCC)   . Allergy   . Anxiety   . Depression   . Eye irritation    R eye- redness- from removing contact lense  . GERD (gastroesophageal reflux disease)   . Herpes simplex type II infection   . Hyperlipidemia   . Hypertension   . Leg pain, right   . Mental disorder   . Neuromuscular disorder (HCC)   . Obesity   . Rheumatoid arthritis involving multiple joints (HCC)    rheumatoid arthritis- hands, degeneration of lumbar  spine    Immunization History  Administered Date(s)  Administered  . Influenza-Unspecified 04/09/2013  . Pneumococcal-Unspecified 06/13/1995  . Tdap 06/13/2011   Past Surgical History:  Procedure Laterality Date  . BACK SURGERY     09/2011, first back surgery- 2000    . CHOLECYSTECTOMY     09/2008  . EYE SURGERY     lasik procedure very long ago- not successful  . LUMBAR LAMINECTOMY/DECOMPRESSION MICRODISCECTOMY  10/18/2011   Procedure: LUMBAR LAMINECTOMY/DECOMPRESSION MICRODISCECTOMY;  Surgeon: Karn Cassis, MD;  Location: [facility];  Service: Neurosurgery;  Laterality: Right;  Redo Right Lumbar four-fivelaminectomy microdiskectomy  . PAROTIDECTOMY Left    FHx:    Reviewed / unchanged  SHx:    Reviewed / unchanged  Systems Review:  Constitutional: Denies fever, chills, wt changes, headaches, insomnia, fatigue, night sweats, change in appetite. Eyes: Denies redness, blurred vision, diplopia, discharge, itchy, watery eyes.  ENT: Denies discharge, congestion, post nasal drip, epistaxis, sore throat, earache, hearing loss, dental pain, tinnitus, vertigo, sinus pain, snoring.  CV: Denies chest pain, palpitations, irregular heartbeat, syncope, dyspnea, diaphoresis, orthopnea, PND, claudication or edema. Respiratory: denies cough, dyspnea, DOE, pleurisy, hoarseness, laryngitis, wheezing.  Gastrointestinal: Denies dysphagia, odynophagia, heartburn, reflux, water brash, abdominal pain or cramps, nausea, vomiting, bloating, diarrhea, constipation, hematemesis, melena, hematochezia  or hemorrhoids. Genitourinary: Denies dysuria, frequency, urgency, nocturia, hesitancy, discharge, hematuria or flank pain. Musculoskeletal: Denies arthralgias, myalgias, stiffness, jt. swelling, pain, limping or strain/sprain.  Skin: Denies pruritus, rash, hives, warts, acne, eczema or change in skin lesion(s). Neuro: No weakness, tremor, incoordination, spasms, paresthesia or pain. Psychiatric: Denies confusion, memory loss or sensory loss. Endo: Denies change  in weight, skin or hair change.  Heme/Lymph: No excessive bleeding, bruising or enlarged lymph nodes.  Physical Exam  BP 118/82   Pulse 72   Temp (!) 97.5 F (36.4 C)   Resp 18   Ht 5\' 6"  (1.676 m)   Wt 215 lb 9.6 oz (97.8 kg)   LMP 06/14/2012   BMI 34.80 kg/m   Appears Over nourished  and in no distress.  Eyes: PERRLA, EOMs, conjunctiva no swelling or erythema. Sinuses: No frontal/maxillary tenderness ENT/Mouth: EAC's clear, TM's nl w/o erythema, bulging. Nares clear w/o erythema, swelling, exudates. Oropharynx clear without erythema or exudates. Oral hygiene is good. Tongue normal, non obstructing. Hearing intact.  Neck: Supple. Thyroid nl. Car 2+/2+ without bruits, nodes or JVD. Chest: Respirations nl with BS clear & equal w/o rales, rhonchi, wheezing or stridor.  Cor: Heart sounds normal w/ regular rate and rhythm without sig. murmurs, gallops, clicks or rubs. Peripheral pulses normal and equal  without edema.  Abdomen: Soft & bowel sounds normal. Non-tender w/o guarding, rebound, hernias, masses or organomegaly.  Lymphatics: Unremarkable.  Musculoskeletal: Full ROM all peripheral extremities, joint stability, 5/5 strength and normal gait.  Skin: Warm, dry without  exposed rashes, lesions or ecchymosis apparent.  Neuro: Cranial nerves intact, reflexes equal bilaterally. Sensory-motor testing grossly intact. Tendon reflexes grossly intact.  Pysch: Alert & oriented x 3.  Insight and judgement nl & appropriate. No ideations.  Assessment and Plan:    1. Essential hypertension - Continue medication, monitor blood pressure at home.  - Continue DASH diet. Reminder to go to the ER if any CP,  SOB, nausea, dizziness, severe HA, changes vision/speech.  2. Hyperlipidemia, mixed - Continue diet/meds, exercise,& lifestyle modifications.   3. Prediabetes - Continue diet, exercise and achief goal BMI < 25  4. Vitamin D deficiency - Continue supplementation.  5. Cirrhosis of  liver without ascites, unspecified hepatic cirrhosis type (HCC)  - masked pt to sign Records Release   6. Rheumatoid arthritis with positive rheumatoid factor, involving unspecified site (HCC)  7. Chronic anxiety - citalopram (CELEXA) 40 MG tablet; Take 1 tablet every morning  Dispense: 30 tablet; Refill: 3 - buPROPion (WELLBUTRIN XL) 300 MG 24 hr tablet; Take 1 tablet every morning  Dispense: 30 tablet; Refill: 3  8. Panic attacks - ALPRAZolam (XANAX) 0.25 MG tablet; Take 1/2 to 1 tablet 2 or 3 x / day ONLY  if needed for Panic attacks  Dispense: 30 tablet; Refill: 0  9. Medication management        Discussed  regular exercise, BP monitoring, weight control to achieve/maintain BMI less than 25 and discussed med and SE's. Patient deferred /declined labs to assess and monitor clinical status. Over 30 minutes of exam, counseling, chart review was performed.  Encourage patient to keep 3 month f/u

## 2017-01-30 NOTE — Patient Instructions (Signed)

## 2017-01-31 ENCOUNTER — Ambulatory Visit (INDEPENDENT_AMBULATORY_CARE_PROVIDER_SITE_OTHER): Payer: PRIVATE HEALTH INSURANCE | Admitting: Internal Medicine

## 2017-01-31 ENCOUNTER — Encounter: Payer: Self-pay | Admitting: Internal Medicine

## 2017-01-31 ENCOUNTER — Other Ambulatory Visit: Payer: Self-pay | Admitting: Physician Assistant

## 2017-01-31 VITALS — BP 118/82 | HR 72 | Temp 97.5°F | Resp 18 | Ht 66.0 in | Wt 215.6 lb

## 2017-01-31 DIAGNOSIS — K7460 Unspecified cirrhosis of liver: Secondary | ICD-10-CM | POA: Diagnosis not present

## 2017-01-31 DIAGNOSIS — Z79899 Other long term (current) drug therapy: Secondary | ICD-10-CM

## 2017-01-31 DIAGNOSIS — R7303 Prediabetes: Secondary | ICD-10-CM

## 2017-01-31 DIAGNOSIS — F410 Panic disorder [episodic paroxysmal anxiety] without agoraphobia: Secondary | ICD-10-CM | POA: Diagnosis not present

## 2017-01-31 DIAGNOSIS — M059 Rheumatoid arthritis with rheumatoid factor, unspecified: Secondary | ICD-10-CM

## 2017-01-31 DIAGNOSIS — I10 Essential (primary) hypertension: Secondary | ICD-10-CM

## 2017-01-31 DIAGNOSIS — E782 Mixed hyperlipidemia: Secondary | ICD-10-CM | POA: Diagnosis not present

## 2017-01-31 DIAGNOSIS — F419 Anxiety disorder, unspecified: Secondary | ICD-10-CM | POA: Diagnosis not present

## 2017-01-31 DIAGNOSIS — E559 Vitamin D deficiency, unspecified: Secondary | ICD-10-CM

## 2017-01-31 MED ORDER — ALPRAZOLAM 0.25 MG PO TABS: ORAL_TABLET | ORAL | 0 refills | Status: AC

## 2017-01-31 MED ORDER — CITALOPRAM HYDROBROMIDE 40 MG PO TABS: ORAL_TABLET | ORAL | 3 refills | Status: DC

## 2017-01-31 MED ORDER — BUPROPION HCL ER (XL) 300 MG PO TB24: ORAL_TABLET | ORAL | 3 refills | Status: DC

## 2017-02-09 ENCOUNTER — Ambulatory Visit: Payer: Self-pay | Admitting: Internal Medicine

## 2017-02-28 ENCOUNTER — Other Ambulatory Visit: Payer: Self-pay | Admitting: Physician Assistant

## 2017-02-28 DIAGNOSIS — I10 Essential (primary) hypertension: Secondary | ICD-10-CM

## 2017-03-01 ENCOUNTER — Other Ambulatory Visit: Payer: Self-pay | Admitting: Physician Assistant

## 2017-03-01 DIAGNOSIS — I10 Essential (primary) hypertension: Secondary | ICD-10-CM

## 2017-03-27 ENCOUNTER — Other Ambulatory Visit: Payer: Self-pay

## 2017-03-27 DIAGNOSIS — I10 Essential (primary) hypertension: Secondary | ICD-10-CM

## 2017-03-27 DIAGNOSIS — M542 Cervicalgia: Secondary | ICD-10-CM

## 2017-03-27 MED ORDER — AMLODIPINE BESYLATE 10 MG PO TABS: 10.0000 mg | ORAL_TABLET | Freq: Every day | ORAL | 0 refills | Status: AC

## 2017-03-27 MED ORDER — TELMISARTAN 80 MG PO TABS: ORAL_TABLET | ORAL | 0 refills | Status: DC

## 2017-03-27 MED ORDER — ATENOLOL 100 MG PO TABS: 100.0000 mg | ORAL_TABLET | Freq: Two times a day (BID) | ORAL | 0 refills | Status: AC

## 2017-03-27 MED ORDER — DICLOFENAC SODIUM 75 MG PO TBEC: 75.0000 mg | DELAYED_RELEASE_TABLET | Freq: Two times a day (BID) | ORAL | 2 refills | Status: DC

## 2017-03-30 ENCOUNTER — Other Ambulatory Visit: Payer: Self-pay | Admitting: Internal Medicine

## 2017-03-30 DIAGNOSIS — I10 Essential (primary) hypertension: Secondary | ICD-10-CM

## 2017-04-05 ENCOUNTER — Other Ambulatory Visit: Payer: Self-pay | Admitting: *Deleted

## 2017-04-05 DIAGNOSIS — F419 Anxiety disorder, unspecified: Secondary | ICD-10-CM

## 2017-04-05 MED ORDER — ACYCLOVIR 400 MG PO TABS: ORAL_TABLET | ORAL | 0 refills | Status: AC

## 2017-04-05 MED ORDER — CITALOPRAM HYDROBROMIDE 40 MG PO TABS: ORAL_TABLET | ORAL | 0 refills | Status: AC

## 2017-04-07 ENCOUNTER — Other Ambulatory Visit: Payer: Self-pay

## 2017-04-07 ENCOUNTER — Other Ambulatory Visit: Payer: Self-pay | Admitting: *Deleted

## 2017-04-07 DIAGNOSIS — I10 Essential (primary) hypertension: Secondary | ICD-10-CM

## 2017-04-07 DIAGNOSIS — M542 Cervicalgia: Secondary | ICD-10-CM

## 2017-04-07 MED ORDER — DICLOFENAC SODIUM 75 MG PO TBEC: 75.0000 mg | DELAYED_RELEASE_TABLET | Freq: Two times a day (BID) | ORAL | 2 refills | Status: AC

## 2017-04-07 MED ORDER — TELMISARTAN 80 MG PO TABS: 80.0000 mg | ORAL_TABLET | Freq: Every day | ORAL | 0 refills | Status: DC

## 2017-04-24 ENCOUNTER — Other Ambulatory Visit: Payer: Self-pay

## 2017-04-24 DIAGNOSIS — F419 Anxiety disorder, unspecified: Secondary | ICD-10-CM

## 2017-04-24 MED ORDER — BUPROPION HCL ER (XL) 300 MG PO TB24: ORAL_TABLET | ORAL | 3 refills | Status: DC

## 2017-05-04 ENCOUNTER — Other Ambulatory Visit: Payer: Self-pay | Admitting: *Deleted

## 2017-05-04 DIAGNOSIS — F419 Anxiety disorder, unspecified: Secondary | ICD-10-CM

## 2017-05-04 MED ORDER — BUPROPION HCL ER (XL) 300 MG PO TB24: ORAL_TABLET | ORAL | 1 refills | Status: AC

## 2017-05-26 NOTE — Progress Notes (Deleted)
Complete Physical  Assessment and Plan:   HPI  51 y.o. female  presents for a complete physical.  Her blood pressure has been controlled at home, on losartan 100mg  QD, today their BP is   She does workout, not regularly, just starting to walk on treadmill for 10 mins She denies chest pain, shortness of breath, dizziness.  She has a complicated history from alcohol dependence with history of alcohol induced psychosis and hepatic encephalopathy. She has hepatitis fibrosis.  Lab Results  Component Value Date   ALT 45 (H) 06/29/2016   AST 38 (H) 06/29/2016   ALKPHOS 106 06/29/2016   BILITOT 0.4 06/29/2016   She has anxiety/depression and insomnia and takes xanax due to this and is on celexa.  Has GERD is on protonix and is controlled.  She has hot flashes and is on prempro for this and states it is better since stopping her job.  She is on bASA.   She has RA and following with Dr. 07/01/2016 in [place] in March. She is not on cholesterol medication, occ FO and RYRE and denies myalgias. Her cholesterol is at goal. The cholesterol last visit was:   Lab Results  Component Value Date   CHOL 230 (H) 06/29/2016   HDL 46 (L) 06/29/2016   LDLCALC NOT CALC 06/29/2016   TRIG 402 (H) 06/29/2016   CHOLHDL 5.0 (H) 06/29/2016    Last A1C in the office was:  Lab Results  Component Value Date   HGBA1C 5.3 06/29/2016  Patient is not on Vitamin D supplement.   Lab Results  Component Value Date   VD25OH 38 06/29/2016  Has B12 def, on supplement when remembers  Lab Results  Component Value Date   VITAMINB12 609 06/29/2016    BMI is There is no height or weight on file to calculate BMI., she is working on diet and exercise. Wt Readings from Last 3 Encounters:  01/31/17 215 lb 9.6 oz (97.8 kg)  09/22/16 216 lb 6.4 oz (98.2 kg)  06/29/16 209 lb 6.4 oz (95 kg)    Current Medications:  Current Outpatient Medications on File Prior to Visit  Medication Sig Dispense Refill  . acyclovir (ZOVIRAX) 400  MG tablet TAKE 1 TABLET (400 MG TOTAL) BY MOUTH 2 (TWO) TIMES DAILY. 180 tablet 0  . ALPRAZolam (XANAX) 0.25 MG tablet Take 1/2 to 1 tablet 2 or 3 x / day ONLY  if needed for Panic attacks 30 tablet 0  . amLODipine (NORVASC) 10 MG tablet TAKE 1 TABLET BY MOUTH EVERY DAY 90 tablet 0  . amLODipine (NORVASC) 10 MG tablet Take 1 tablet (10 mg total) by mouth daily. 90 tablet 0  . atenolol (TENORMIN) 100 MG tablet Take 1 tablet (100 mg total) by mouth 2 (two) times daily. 360 tablet 0  . baclofen (LIORESAL) 10 MG tablet 1/2-1 tablet up to 3 x a day as needed for neck pain 30 tablet 1  . buPROPion (WELLBUTRIN XL) 300 MG 24 hr tablet Take 1 tablet every morning 30 tablet 1  . citalopram (CELEXA) 40 MG tablet Take 1 tablet every morning 90 tablet 0  . diclofenac (VOLTAREN) 75 MG EC tablet Take 1 tablet (75 mg total) 2 (two) times daily by mouth. 60 tablet 2  . hydroxypropyl methylcellulose / hypromellose (ISOPTO TEARS / GONIOVISC) 2.5 % ophthalmic solution Place 1 drop into both eyes 3 (three) times daily as needed for dry eyes.    07/01/16 omeprazole (PRILOSEC) 20 MG capsule Take 1 capsule (  20 mg total) by mouth daily. 90 capsule 2  . sulfaSALAzine (AZULFIDINE) 500 MG tablet Take 500 mg by mouth 2 (two) times daily.    Marland Kitchen telmisartan (MICARDIS) 80 MG tablet Take 1 tablet (80 mg total) daily by mouth. for blood pressure 90 tablet 0   No current facility-administered medications on file prior to visit.    Health Maintenance:   Immunization History  Administered Date(s) Administered  . Influenza-Unspecified 04/09/2013  . Pneumococcal-Unspecified 06/13/1995  . Tdap 06/13/2011   TDAP  2013 Pneumovax: 1997 Flu vaccine: 03/2013 at work Zostavax: N/A  Pap: 06/22/2012 normal - never abnormal pap- due 2017 MGM: 06/28/2012 OVER DUE DEXA: N/A  Colonoscopy: Declines and declines stool cards EGD: N/A  Patient Care Team: Lucky Cowboy, MD as PCP - General (Internal Medicine) Lenna Sciara, MD as  Referring Physician (Internal Medicine) Fredrich Birks, OD as Referring Physician (Optometry) [place] Callas, DDS (Dentistry) Hilda Lias, MD as Consulting Physician (Neurosurgery) Coralyn Pear, MD as Referring Physician (Internal Medicine)  Medical History:  Past Medical History:  Diagnosis Date  . Alcoholism (HCC)   . Allergy   . Anxiety   . Depression   . Eye irritation    R eye- redness- from removing contact lense  . GERD (gastroesophageal reflux disease)   . Herpes simplex type II infection   . Hyperlipidemia   . Hypertension   . Leg pain, right   . Mental disorder   . Neuromuscular disorder (HCC)   . Obesity   . Rheumatoid arthritis involving multiple joints (HCC)    rheumatoid arthritis- hands, degeneration of lumbar  spine    Allergies Allergies  Allergen Reactions  . Codeine Itching    SURGICAL HISTORY She  has a past surgical history that includes Lumbar laminectomy/decompression microdiscectomy (10/18/2011); Back surgery; Eye surgery; Cholecystectomy; and Parotidectomy (Left). FAMILY HISTORY Her family history includes COPD in her father; Heart disease in her father and sister; Hypertension in her mother; Stroke in her mother. SOCIAL HISTORY She  reports that  has never smoked. she has never used smokeless tobacco. She reports that she drinks alcohol. She reports that she does not use drugs.  Review of Systems: Review of Systems  Constitutional: Negative.   HENT: Positive for congestion (has been using afrin x 3-4 weeks). Negative for ear discharge, ear pain, hearing loss, nosebleeds, sore throat and tinnitus.   Eyes: Negative.   Respiratory: Positive for cough (with nasal drip). Negative for hemoptysis, sputum production, shortness of breath, wheezing and stridor.   Cardiovascular: Positive for palpitations (with stress, 90% better since quit job). Negative for chest pain, orthopnea, claudication, leg swelling and PND.  Gastrointestinal: Negative.    Genitourinary: Negative.        Some urge incontinence at work, has been better without stress.   Musculoskeletal: Negative.   Skin: Negative.   Neurological: Negative.  Negative for headaches.  Psychiatric/Behavioral: Negative.  Negative for depression. The patient is not nervous/anxious and does not have insomnia.     Physical Exam: Estimated body mass index is 34.8 kg/m as calculated from the following:   Height as of 01/31/17: 5\' 6"  (1.676 m).   Weight as of 01/31/17: 215 lb 9.6 oz (97.8 kg). LMP 06/14/2012  General Appearance: Well nourished, in no apparent distress.  Eyes: PERRLA, EOMs, conjunctiva no swelling or erythema, normal fundi and vessels.  Sinuses: No Frontal/maxillary tenderness  ENT/Mouth: Ext aud canals clear, normal light reflex with TMs without erythema, bulging. Good dentition. No erythema, swelling,  or exudate on post pharynx. Tonsils not swollen or erythematous. Hearing normal.  Neck: Supple, thyroid normal. No bruits  Respiratory: Respiratory effort normal, BS equal bilaterally without rales, rhonchi, wheezing or stridor.  Cardio: RRR without murmurs, rubs or gallops. Brisk peripheral pulses without edema.  Chest: symmetric, with normal excursions and percussion.  Breasts: Symmetric, right breast at 7-8oclock mobile deeper 2cm nodule, nontender, without nipple discharge, retractions.  Abdomen: Soft, nontender, no guarding, rebound, hernias, masses, or organomegaly. .  Lymphatics: Non tender without lymphadenopathy.  Genitourinary: defer Musculoskeletal: Full ROM all peripheral extremities,5/5 strength, and normal gait.  Skin: Warm, dry without rashes, lesions, ecchymosis. Neuro: Cranial nerves intact, reflexes equal bilaterally. Normal muscle tone, no cerebellar symptoms. Sensation intact.  Psych: Awake and oriented X 3, normal affect, Insight and Judgment appropriate.   EKG: WNL, PRWP, no ST changes. AORTA SCAN: WNL   Quentin Mulling 7:47 AM [facility]

## 2017-05-29 ENCOUNTER — Encounter: Payer: Self-pay | Admitting: Physician Assistant

## 2017-05-30 NOTE — Progress Notes (Deleted)
Complete Physical  Assessment and Plan:   HPI  52 y.o. female  presents for a complete physical.  Her blood pressure has been controlled at home, on losartan 100mg  QD, today their BP is   She does workout, not regularly, just starting to walk on treadmill for 10 mins She denies chest pain, shortness of breath, dizziness.  She has a complicated history from alcohol dependence with history of alcohol induced psychosis and hepatic encephalopathy. She has hepatitis fibrosis.  Lab Results  Component Value Date   ALT 45 (H) 06/29/2016   AST 38 (H) 06/29/2016   ALKPHOS 106 06/29/2016   BILITOT 0.4 06/29/2016   She has anxiety/depression and insomnia and takes xanax due to this and is on celexa.  Has GERD is on protonix and is controlled.  She has hot flashes and is on prempro for this and states it is better since stopping her job.  She is on bASA.   She has RA and following with Dr. 07/01/2016 in [place] in March. She is not on cholesterol medication, occ FO and RYRE and denies myalgias. Her cholesterol is at goal. The cholesterol last visit was:   Lab Results  Component Value Date   CHOL 230 (H) 06/29/2016   HDL 46 (L) 06/29/2016   LDLCALC NOT CALC 06/29/2016   TRIG 402 (H) 06/29/2016   CHOLHDL 5.0 (H) 06/29/2016    Last A1C in the office was:  Lab Results  Component Value Date   HGBA1C 5.3 06/29/2016  Patient is not on Vitamin D supplement.   Lab Results  Component Value Date   VD25OH 38 06/29/2016  Has B12 def, on supplement when remembers  Lab Results  Component Value Date   VITAMINB12 609 06/29/2016    BMI is There is no height or weight on file to calculate BMI., she is working on diet and exercise. Wt Readings from Last 3 Encounters:  01/31/17 215 lb 9.6 oz (97.8 kg)  09/22/16 216 lb 6.4 oz (98.2 kg)  06/29/16 209 lb 6.4 oz (95 kg)    Current Medications:  Current Outpatient Medications on File Prior to Visit  Medication Sig Dispense Refill  . acyclovir (ZOVIRAX) 400  MG tablet TAKE 1 TABLET (400 MG TOTAL) BY MOUTH 2 (TWO) TIMES DAILY. 180 tablet 0  . ALPRAZolam (XANAX) 0.25 MG tablet Take 1/2 to 1 tablet 2 or 3 x / day ONLY  if needed for Panic attacks 30 tablet 0  . amLODipine (NORVASC) 10 MG tablet TAKE 1 TABLET BY MOUTH EVERY DAY 90 tablet 0  . amLODipine (NORVASC) 10 MG tablet Take 1 tablet (10 mg total) by mouth daily. 90 tablet 0  . atenolol (TENORMIN) 100 MG tablet Take 1 tablet (100 mg total) by mouth 2 (two) times daily. 360 tablet 0  . baclofen (LIORESAL) 10 MG tablet 1/2-1 tablet up to 3 x a day as needed for neck pain 30 tablet 1  . buPROPion (WELLBUTRIN XL) 300 MG 24 hr tablet Take 1 tablet every morning 30 tablet 1  . citalopram (CELEXA) 40 MG tablet Take 1 tablet every morning 90 tablet 0  . diclofenac (VOLTAREN) 75 MG EC tablet Take 1 tablet (75 mg total) 2 (two) times daily by mouth. 60 tablet 2  . hydroxypropyl methylcellulose / hypromellose (ISOPTO TEARS / GONIOVISC) 2.5 % ophthalmic solution Place 1 drop into both eyes 3 (three) times daily as needed for dry eyes.    07/01/16 omeprazole (PRILOSEC) 20 MG capsule Take 1 capsule (  20 mg total) by mouth daily. 90 capsule 2  . sulfaSALAzine (AZULFIDINE) 500 MG tablet Take 500 mg by mouth 2 (two) times daily.    Marland Kitchen telmisartan (MICARDIS) 80 MG tablet Take 1 tablet (80 mg total) daily by mouth. for blood pressure 90 tablet 0   No current facility-administered medications on file prior to visit.    Health Maintenance:   Immunization History  Administered Date(s) Administered  . Influenza-Unspecified 04/09/2013  . Pneumococcal-Unspecified 06/13/1995  . Tdap 06/13/2011   TDAP  2013 Pneumovax: 1997 Flu vaccine: 03/2013 at work Zostavax: N/A  Pap: 06/22/2012 normal - never abnormal pap- due 2017 MGM: 06/28/2012 OVER DUE DEXA: N/A  Colonoscopy: Declines and declines stool cards EGD: N/A  Patient Care Team: Lucky Cowboy, MD as PCP - General (Internal Medicine) Lenna Sciara, MD as  Referring Physician (Internal Medicine) Fredrich Birks, OD as Referring Physician (Optometry) Haviland Callas, DDS (Dentistry) Hilda Lias, MD as Consulting Physician (Neurosurgery) Coralyn Pear, MD as Referring Physician (Internal Medicine)  Medical History:  Past Medical History:  Diagnosis Date  . Alcoholism (HCC)   . Allergy   . Anxiety   . Depression   . Eye irritation    R eye- redness- from removing contact lense  . GERD (gastroesophageal reflux disease)   . Herpes simplex type II infection   . Hyperlipidemia   . Hypertension   . Leg pain, right   . Mental disorder   . Neuromuscular disorder (HCC)   . Obesity   . Rheumatoid arthritis involving multiple joints (HCC)    rheumatoid arthritis- hands, degeneration of lumbar  spine    Allergies Allergies  Allergen Reactions  . Codeine Itching    SURGICAL HISTORY She  has a past surgical history that includes Lumbar laminectomy/decompression microdiscectomy (10/18/2011); Back surgery; Eye surgery; Cholecystectomy; and Parotidectomy (Left). FAMILY HISTORY Her family history includes COPD in her father; Heart disease in her father and sister; Hypertension in her mother; Stroke in her mother. SOCIAL HISTORY She  reports that  has never smoked. she has never used smokeless tobacco. She reports that she drinks alcohol. She reports that she does not use drugs.  Review of Systems: Review of Systems  Constitutional: Negative.   HENT: Positive for congestion (has been using afrin x 3-4 weeks). Negative for ear discharge, ear pain, hearing loss, nosebleeds, sore throat and tinnitus.   Eyes: Negative.   Respiratory: Positive for cough (with nasal drip). Negative for hemoptysis, sputum production, shortness of breath, wheezing and stridor.   Cardiovascular: Positive for palpitations (with stress, 90% better since quit job). Negative for chest pain, orthopnea, claudication, leg swelling and PND.  Gastrointestinal: Negative.    Genitourinary: Negative.        Some urge incontinence at work, has been better without stress.   Musculoskeletal: Negative.   Skin: Negative.   Neurological: Negative.  Negative for headaches.  Psychiatric/Behavioral: Negative.  Negative for depression. The patient is not nervous/anxious and does not have insomnia.     Physical Exam: Estimated body mass index is 34.8 kg/m as calculated from the following:   Height as of 01/31/17: 5\' 6"  (1.676 m).   Weight as of 01/31/17: 215 lb 9.6 oz (97.8 kg). LMP 06/14/2012  General Appearance: Well nourished, in no apparent distress.  Eyes: PERRLA, EOMs, conjunctiva no swelling or erythema, normal fundi and vessels.  Sinuses: No Frontal/maxillary tenderness  ENT/Mouth: Ext aud canals clear, normal light reflex with TMs without erythema, bulging. Good dentition. No erythema, swelling,  or exudate on post pharynx. Tonsils not swollen or erythematous. Hearing normal.  Neck: Supple, thyroid normal. No bruits  Respiratory: Respiratory effort normal, BS equal bilaterally without rales, rhonchi, wheezing or stridor.  Cardio: RRR without murmurs, rubs or gallops. Brisk peripheral pulses without edema.  Chest: symmetric, with normal excursions and percussion.  Breasts: Symmetric, right breast at 7-8oclock mobile deeper 2cm nodule, nontender, without nipple discharge, retractions.  Abdomen: Soft, nontender, no guarding, rebound, hernias, masses, or organomegaly. .  Lymphatics: Non tender without lymphadenopathy.  Genitourinary: defer Musculoskeletal: Full ROM all peripheral extremities,5/5 strength, and normal gait.  Skin: Warm, dry without rashes, lesions, ecchymosis. Neuro: Cranial nerves intact, reflexes equal bilaterally. Normal muscle tone, no cerebellar symptoms. Sensation intact.  Psych: Awake and oriented X 3, normal affect, Insight and Judgment appropriate.   EKG: WNL, PRWP, no ST changes. AORTA SCAN: WNL   Quentin Mulling 5:29 PM [facility]

## 2017-05-31 ENCOUNTER — Encounter: Payer: Self-pay | Admitting: Physician Assistant

## 2017-07-03 ENCOUNTER — Other Ambulatory Visit: Payer: Self-pay | Admitting: *Deleted

## 2017-07-03 DIAGNOSIS — I10 Essential (primary) hypertension: Secondary | ICD-10-CM

## 2017-07-03 MED ORDER — TELMISARTAN 80 MG PO TABS: 80.0000 mg | ORAL_TABLET | Freq: Every day | ORAL | 0 refills | Status: AC

## 2017-07-05 ENCOUNTER — Other Ambulatory Visit: Payer: Self-pay | Admitting: Internal Medicine

## 2017-07-05 DIAGNOSIS — K219 Gastro-esophageal reflux disease without esophagitis: Secondary | ICD-10-CM

## 2017-10-11 ENCOUNTER — Other Ambulatory Visit: Payer: Self-pay | Admitting: Internal Medicine

## 2017-10-19 ENCOUNTER — Other Ambulatory Visit: Payer: Self-pay | Admitting: Internal Medicine

## 2018-03-11 IMAGING — CR DG CHEST 2V
2 series · 2 of 2 positions shown · non-contrast
Comparison: None.

CLINICAL DATA: Dyspnea for 2 weeks.

EXAM:
CHEST  2 VIEW

[chest pa]
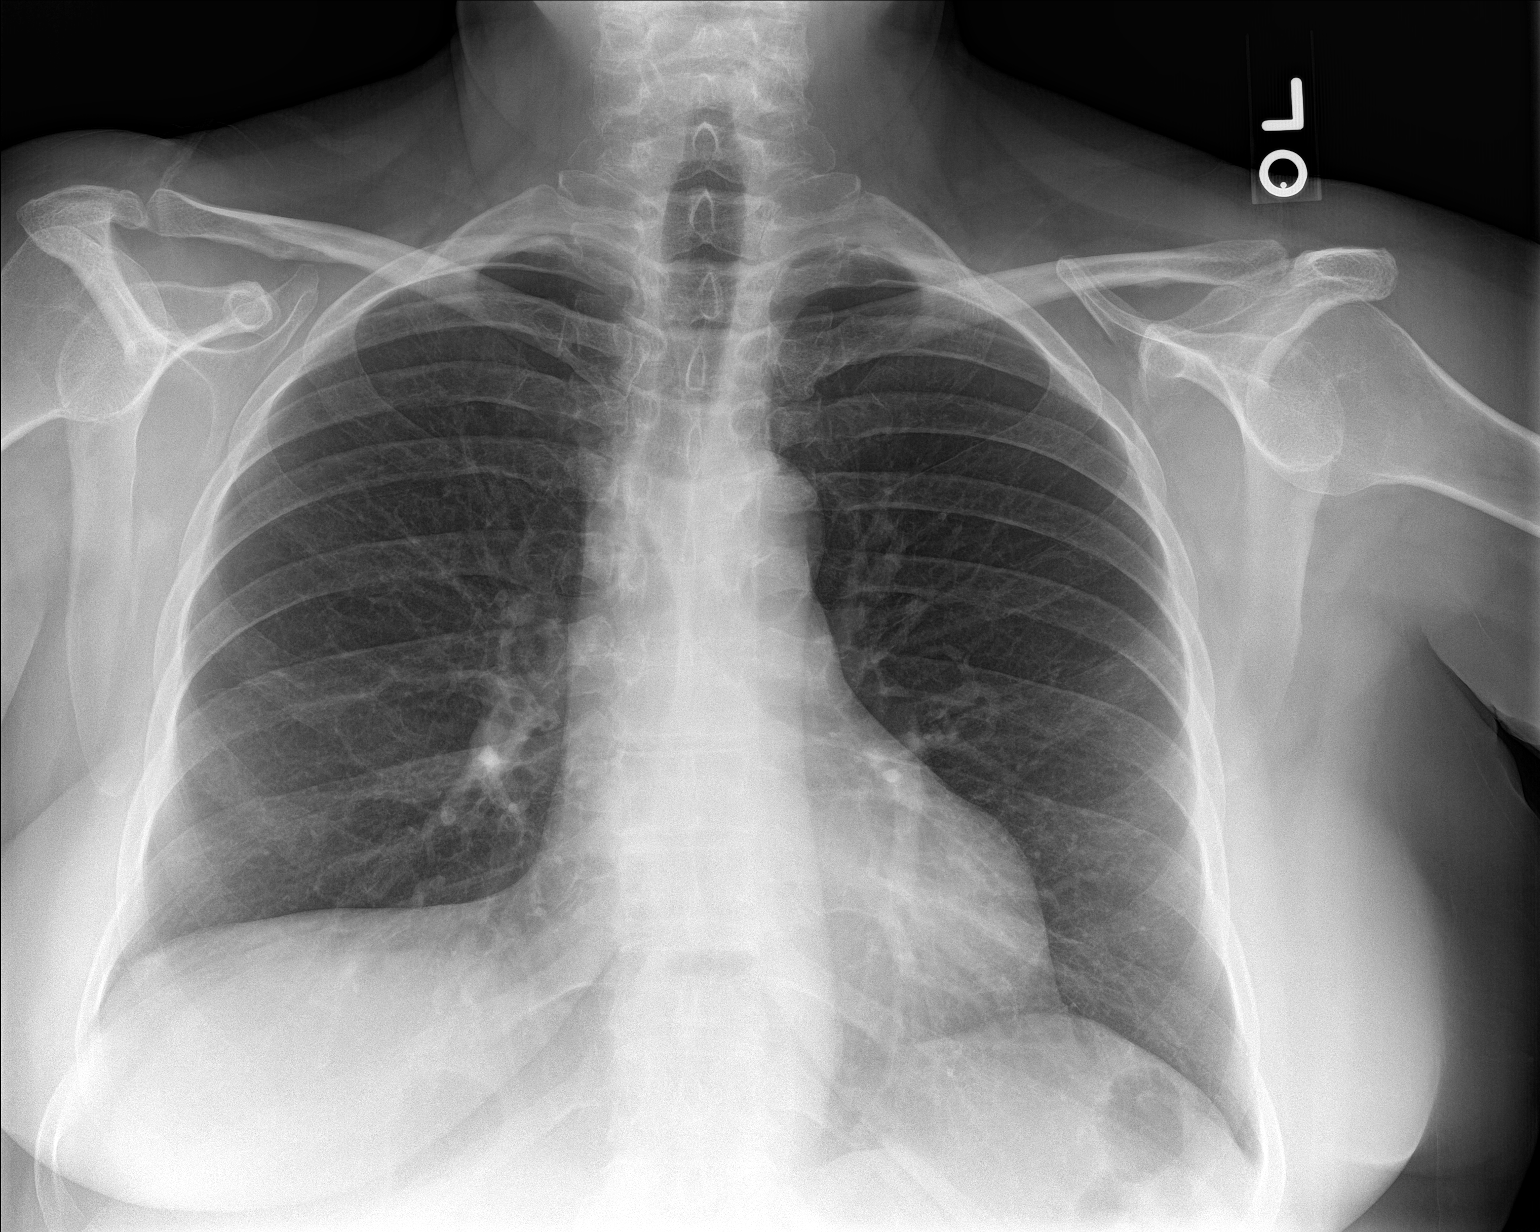

[chest lat]
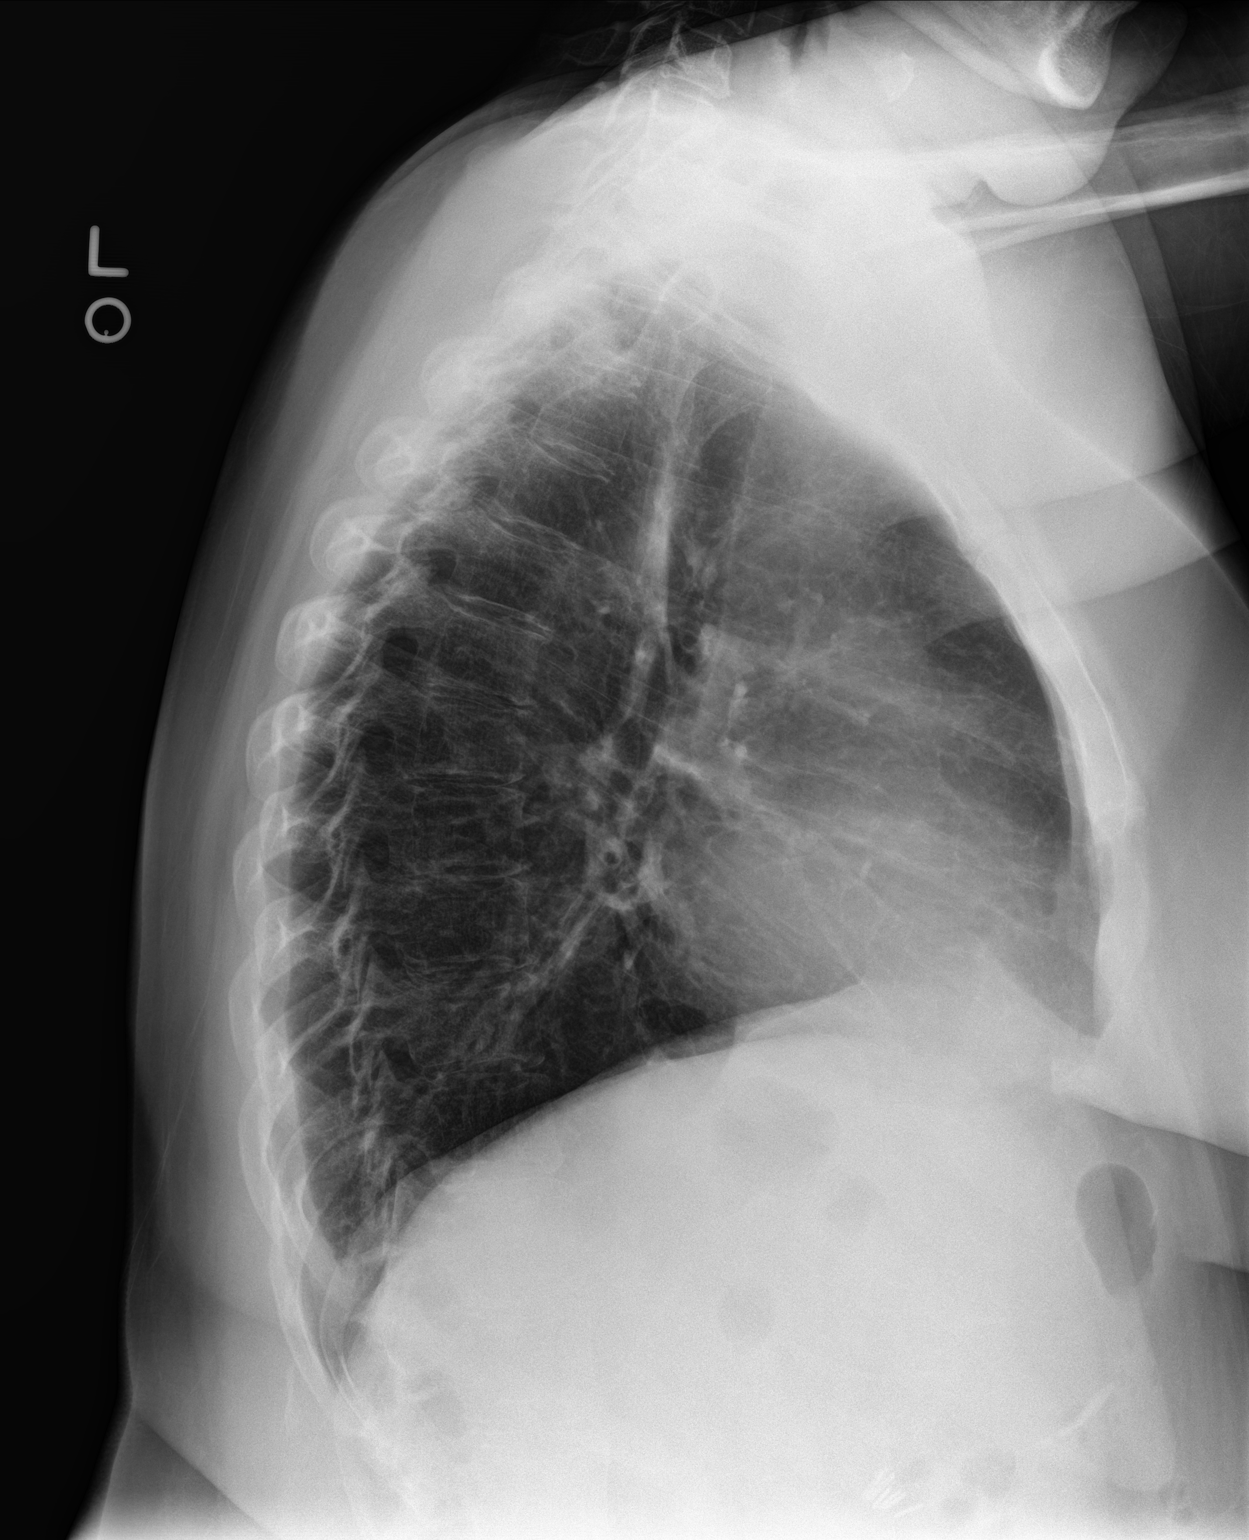

[2 of 2 positions shown; findings below may reference images not displayed]

FINDINGS: The heart size and mediastinal contours are within normal limits.
Both lungs are clear. The visualized skeletal structures are
unremarkable.
IMPRESSION: No active cardiopulmonary disease.

## 2018-06-12 ENCOUNTER — Encounter: Payer: Self-pay | Admitting: Physician Assistant

## 2020-04-29 DEATH — deceased

## 2023-12-15 ENCOUNTER — Encounter: Payer: Self-pay | Admitting: Advanced Practice Midwife
